# Patient Record
Sex: Male | Born: 1950
Health system: Southern US, Community
[De-identification: ages and names within clinical notes are randomized; demographics above are authoritative.]

## PROBLEM LIST (undated history)

## (undated) DIAGNOSIS — I739 Peripheral vascular disease, unspecified: Secondary | ICD-10-CM

## (undated) DIAGNOSIS — K589 Irritable bowel syndrome without diarrhea: Secondary | ICD-10-CM

## (undated) DIAGNOSIS — S060XAA Concussion with loss of consciousness status unknown, initial encounter: Secondary | ICD-10-CM

## (undated) DIAGNOSIS — J189 Pneumonia, unspecified organism: Secondary | ICD-10-CM

## (undated) DIAGNOSIS — K219 Gastro-esophageal reflux disease without esophagitis: Secondary | ICD-10-CM

## (undated) DIAGNOSIS — E785 Hyperlipidemia, unspecified: Secondary | ICD-10-CM

## (undated) DIAGNOSIS — I499 Cardiac arrhythmia, unspecified: Secondary | ICD-10-CM

## (undated) DIAGNOSIS — C61 Malignant neoplasm of prostate: Secondary | ICD-10-CM

## (undated) DIAGNOSIS — F32A Depression, unspecified: Secondary | ICD-10-CM

## (undated) DIAGNOSIS — M3130 Wegener's granulomatosis without renal involvement: Secondary | ICD-10-CM

## (undated) DIAGNOSIS — I10 Essential (primary) hypertension: Secondary | ICD-10-CM

## (undated) DIAGNOSIS — E119 Type 2 diabetes mellitus without complications: Secondary | ICD-10-CM

## (undated) DIAGNOSIS — R002 Palpitations: Secondary | ICD-10-CM

## (undated) DIAGNOSIS — I2510 Atherosclerotic heart disease of native coronary artery without angina pectoris: Secondary | ICD-10-CM

## (undated) DIAGNOSIS — M1990 Unspecified osteoarthritis, unspecified site: Secondary | ICD-10-CM

## (undated) DIAGNOSIS — S060X9A Concussion with loss of consciousness of unspecified duration, initial encounter: Secondary | ICD-10-CM

## (undated) DIAGNOSIS — F329 Major depressive disorder, single episode, unspecified: Secondary | ICD-10-CM

## (undated) DIAGNOSIS — R0600 Dyspnea, unspecified: Secondary | ICD-10-CM

## (undated) DIAGNOSIS — N529 Male erectile dysfunction, unspecified: Secondary | ICD-10-CM

## (undated) DIAGNOSIS — K5730 Diverticulosis of large intestine without perforation or abscess without bleeding: Secondary | ICD-10-CM

## (undated) DIAGNOSIS — T148XXA Other injury of unspecified body region, initial encounter: Secondary | ICD-10-CM

## (undated) HISTORY — DX: Diverticulosis of large intestine without perforation or abscess without bleeding: K57.30

## (undated) HISTORY — DX: Irritable bowel syndrome, unspecified: K58.9

## (undated) HISTORY — DX: Male erectile dysfunction, unspecified: N52.9

## (undated) HISTORY — DX: Wegener's granulomatosis without renal involvement: M31.30

## (undated) HISTORY — DX: Palpitations: R00.2

## (undated) HISTORY — DX: Malignant neoplasm of prostate: C61

## (undated) HISTORY — DX: Hyperlipidemia, unspecified: E78.5

## (undated) HISTORY — PX: CARDIAC CATHETERIZATION: SHX172

## (undated) HISTORY — DX: Gastro-esophageal reflux disease without esophagitis: K21.9

## (undated) HISTORY — PX: ROTATOR CUFF REPAIR: SHX139

## (undated) HISTORY — DX: Type 2 diabetes mellitus without complications: E11.9

---

## 1999-01-14 ENCOUNTER — Encounter: Payer: Self-pay | Admitting: Gastroenterology

## 1999-01-14 ENCOUNTER — Ambulatory Visit (HOSPITAL_COMMUNITY): Admission: RE | Admit: 1999-01-14 | Discharge: 1999-01-14 | Payer: Self-pay | Admitting: Gastroenterology

## 1999-07-18 ENCOUNTER — Encounter: Payer: Self-pay | Admitting: Gastroenterology

## 1999-07-18 ENCOUNTER — Encounter: Admission: RE | Admit: 1999-07-18 | Discharge: 1999-07-18 | Payer: Self-pay | Admitting: Gastroenterology

## 2004-04-16 ENCOUNTER — Emergency Department (HOSPITAL_COMMUNITY): Admission: EM | Admit: 2004-04-16 | Discharge: 2004-04-16 | Payer: Self-pay | Admitting: Emergency Medicine

## 2004-05-21 ENCOUNTER — Ambulatory Visit: Admission: RE | Admit: 2004-05-21 | Discharge: 2004-07-18 | Payer: Self-pay | Admitting: Radiation Oncology

## 2005-04-08 DIAGNOSIS — C61 Malignant neoplasm of prostate: Secondary | ICD-10-CM | POA: Diagnosis present

## 2005-04-08 HISTORY — DX: Malignant neoplasm of prostate: C61

## 2005-04-08 HISTORY — PX: INSERTION PROSTATE RADIATION SEED: SUR718

## 2005-05-13 ENCOUNTER — Ambulatory Visit: Admission: RE | Admit: 2005-05-13 | Discharge: 2005-06-21 | Payer: Self-pay | Admitting: Radiation Oncology

## 2005-08-29 ENCOUNTER — Ambulatory Visit: Admission: RE | Admit: 2005-08-29 | Discharge: 2006-06-09 | Payer: Self-pay | Admitting: Radiation Oncology

## 2006-01-22 ENCOUNTER — Encounter: Admission: RE | Admit: 2006-01-22 | Discharge: 2006-01-22 | Payer: Self-pay | Admitting: Urology

## 2006-01-29 ENCOUNTER — Ambulatory Visit (HOSPITAL_BASED_OUTPATIENT_CLINIC_OR_DEPARTMENT_OTHER): Admission: RE | Admit: 2006-01-29 | Discharge: 2006-01-29 | Payer: Self-pay | Admitting: Urology

## 2006-02-24 ENCOUNTER — Ambulatory Visit: Admission: RE | Admit: 2006-02-24 | Discharge: 2006-06-09 | Payer: Self-pay | Admitting: Radiation Oncology

## 2006-04-08 HISTORY — PX: OTHER SURGICAL HISTORY: SHX169

## 2008-04-08 DIAGNOSIS — T148XXA Other injury of unspecified body region, initial encounter: Secondary | ICD-10-CM

## 2008-04-08 HISTORY — DX: Other injury of unspecified body region, initial encounter: T14.8XXA

## 2008-08-05 ENCOUNTER — Emergency Department (HOSPITAL_COMMUNITY): Admission: EM | Admit: 2008-08-05 | Discharge: 2008-08-05 | Payer: Self-pay | Admitting: Family Medicine

## 2010-04-29 ENCOUNTER — Encounter: Payer: Self-pay | Admitting: Gastroenterology

## 2010-09-08 ENCOUNTER — Emergency Department (HOSPITAL_COMMUNITY)
Admission: EM | Admit: 2010-09-08 | Discharge: 2010-09-08 | Disposition: A | Discharge status: Home / Self Care | Payer: BC Managed Care – PPO | Attending: Emergency Medicine | Admitting: Emergency Medicine

## 2010-09-08 ENCOUNTER — Emergency Department (HOSPITAL_COMMUNITY): Payer: BC Managed Care – PPO

## 2010-09-08 DIAGNOSIS — M545 Low back pain, unspecified: Secondary | ICD-10-CM | POA: Insufficient documentation

## 2010-09-08 DIAGNOSIS — X58XXXA Exposure to other specified factors, initial encounter: Secondary | ICD-10-CM | POA: Insufficient documentation

## 2010-09-08 DIAGNOSIS — S32009A Unspecified fracture of unspecified lumbar vertebra, initial encounter for closed fracture: Secondary | ICD-10-CM | POA: Insufficient documentation

## 2010-09-08 DIAGNOSIS — E119 Type 2 diabetes mellitus without complications: Secondary | ICD-10-CM | POA: Insufficient documentation

## 2014-07-22 ENCOUNTER — Ambulatory Visit
Admission: RE | Admit: 2014-07-22 | Discharge: 2014-07-22 | Disposition: A | Discharge status: Home / Self Care | Payer: Self-pay | Source: Ambulatory Visit | Attending: Internal Medicine | Admitting: Internal Medicine

## 2014-07-22 ENCOUNTER — Other Ambulatory Visit: Payer: Self-pay | Admitting: Internal Medicine

## 2014-07-22 DIAGNOSIS — M3130 Wegener's granulomatosis without renal involvement: Secondary | ICD-10-CM

## 2014-08-25 ENCOUNTER — Encounter: Payer: Self-pay | Admitting: Emergency Medicine

## 2014-08-26 ENCOUNTER — Institutional Professional Consult (permissible substitution): Payer: Self-pay | Admitting: Emergency Medicine

## 2014-09-07 ENCOUNTER — Institutional Professional Consult (permissible substitution): Payer: Self-pay | Admitting: Internal Medicine

## 2015-01-13 ENCOUNTER — Other Ambulatory Visit: Payer: Self-pay | Admitting: Nephrology

## 2015-01-13 DIAGNOSIS — R319 Hematuria, unspecified: Secondary | ICD-10-CM

## 2015-01-18 ENCOUNTER — Other Ambulatory Visit: Payer: Self-pay

## 2015-01-25 ENCOUNTER — Other Ambulatory Visit: Payer: Self-pay

## 2015-01-26 ENCOUNTER — Ambulatory Visit
Admission: RE | Admit: 2015-01-26 | Discharge: 2015-01-26 | Disposition: A | Discharge status: Home / Self Care | Payer: Medicare Other | Source: Ambulatory Visit | Attending: Nephrology | Admitting: Nephrology

## 2015-01-26 DIAGNOSIS — R319 Hematuria, unspecified: Secondary | ICD-10-CM

## 2016-05-08 ENCOUNTER — Other Ambulatory Visit: Payer: Self-pay | Admitting: Gastroenterology

## 2016-05-08 DIAGNOSIS — E1165 Type 2 diabetes mellitus with hyperglycemia: Secondary | ICD-10-CM | POA: Diagnosis not present

## 2016-05-08 DIAGNOSIS — Z79899 Other long term (current) drug therapy: Secondary | ICD-10-CM | POA: Diagnosis not present

## 2016-05-08 DIAGNOSIS — E7800 Pure hypercholesterolemia, unspecified: Secondary | ICD-10-CM | POA: Diagnosis not present

## 2016-05-08 DIAGNOSIS — Z1389 Encounter for screening for other disorder: Secondary | ICD-10-CM | POA: Diagnosis not present

## 2016-05-08 DIAGNOSIS — F419 Anxiety disorder, unspecified: Secondary | ICD-10-CM | POA: Diagnosis not present

## 2016-05-08 DIAGNOSIS — M3130 Wegener's granulomatosis without renal involvement: Secondary | ICD-10-CM | POA: Diagnosis not present

## 2016-05-08 DIAGNOSIS — Z0001 Encounter for general adult medical examination with abnormal findings: Secondary | ICD-10-CM | POA: Diagnosis not present

## 2016-05-08 DIAGNOSIS — K219 Gastro-esophageal reflux disease without esophagitis: Secondary | ICD-10-CM | POA: Diagnosis not present

## 2016-05-08 DIAGNOSIS — C61 Malignant neoplasm of prostate: Secondary | ICD-10-CM | POA: Diagnosis not present

## 2016-05-08 DIAGNOSIS — R0600 Dyspnea, unspecified: Secondary | ICD-10-CM | POA: Diagnosis not present

## 2016-06-06 DIAGNOSIS — M545 Low back pain: Secondary | ICD-10-CM | POA: Diagnosis not present

## 2016-06-06 DIAGNOSIS — R319 Hematuria, unspecified: Secondary | ICD-10-CM | POA: Diagnosis not present

## 2016-06-06 DIAGNOSIS — R5382 Chronic fatigue, unspecified: Secondary | ICD-10-CM | POA: Diagnosis not present

## 2016-06-06 DIAGNOSIS — R0602 Shortness of breath: Secondary | ICD-10-CM | POA: Diagnosis not present

## 2016-06-06 DIAGNOSIS — M150 Primary generalized (osteo)arthritis: Secondary | ICD-10-CM | POA: Diagnosis not present

## 2016-06-06 DIAGNOSIS — M3130 Wegener's granulomatosis without renal involvement: Secondary | ICD-10-CM | POA: Diagnosis not present

## 2016-06-07 ENCOUNTER — Other Ambulatory Visit: Payer: Self-pay | Admitting: Physician Assistant

## 2016-06-07 DIAGNOSIS — R0602 Shortness of breath: Secondary | ICD-10-CM

## 2016-06-11 ENCOUNTER — Ambulatory Visit
Admission: RE | Admit: 2016-06-11 | Discharge: 2016-06-11 | Disposition: A | Discharge status: Home / Self Care | Payer: Medicare Other | Source: Ambulatory Visit | Attending: Physician Assistant | Admitting: Physician Assistant

## 2016-06-11 ENCOUNTER — Inpatient Hospital Stay
Admission: RE | Admit: 2016-06-11 | Discharge: 2016-06-11 | Disposition: A | Discharge status: Home / Self Care | Payer: Self-pay | Source: Ambulatory Visit | Attending: Physician Assistant | Admitting: Physician Assistant

## 2016-06-11 DIAGNOSIS — R0602 Shortness of breath: Secondary | ICD-10-CM

## 2016-06-11 DIAGNOSIS — R918 Other nonspecific abnormal finding of lung field: Secondary | ICD-10-CM | POA: Diagnosis not present

## 2016-06-11 MED ORDER — IOPAMIDOL (ISOVUE-300) INJECTION 61%
100.0000 mL | Freq: Once | INTRAVENOUS | Status: AC | PRN
  Administered 2016-06-11: 100 mL via INTRAVENOUS

## 2016-06-19 ENCOUNTER — Encounter (HOSPITAL_COMMUNITY): Payer: Self-pay | Admitting: *Deleted

## 2016-06-20 ENCOUNTER — Encounter (HOSPITAL_COMMUNITY): Payer: Self-pay | Admitting: *Deleted

## 2016-06-20 NOTE — Progress Notes (Signed)
Patient told to be clear liquids up to 3 hours before procedure on 06-24-16, spoke with dr hodierne anesthesia and patient is to be clear liquids until 6 hours before procedure and called patient and made aware clear liquids until 6 hours before procedure.

## 2016-06-24 ENCOUNTER — Ambulatory Visit (HOSPITAL_COMMUNITY): Payer: Medicare Other | Admitting: Anesthesiology

## 2016-06-24 ENCOUNTER — Encounter (HOSPITAL_COMMUNITY)
Admission: RE | Disposition: A | Discharge status: Home / Self Care | Payer: Self-pay | Source: Ambulatory Visit | Attending: Gastroenterology

## 2016-06-24 ENCOUNTER — Encounter (HOSPITAL_COMMUNITY): Payer: Self-pay | Admitting: *Deleted

## 2016-06-24 ENCOUNTER — Ambulatory Visit (HOSPITAL_COMMUNITY)
Admission: RE | Admit: 2016-06-24 | Discharge: 2016-06-24 | Disposition: A | Discharge status: Home / Self Care | Payer: Medicare Other | Source: Ambulatory Visit | Attending: Gastroenterology | Admitting: Gastroenterology

## 2016-06-24 DIAGNOSIS — Z1211 Encounter for screening for malignant neoplasm of colon: Secondary | ICD-10-CM | POA: Insufficient documentation

## 2016-06-24 DIAGNOSIS — K5730 Diverticulosis of large intestine without perforation or abscess without bleeding: Secondary | ICD-10-CM | POA: Diagnosis not present

## 2016-06-24 DIAGNOSIS — E7800 Pure hypercholesterolemia, unspecified: Secondary | ICD-10-CM | POA: Insufficient documentation

## 2016-06-24 DIAGNOSIS — E119 Type 2 diabetes mellitus without complications: Secondary | ICD-10-CM | POA: Insufficient documentation

## 2016-06-24 DIAGNOSIS — K219 Gastro-esophageal reflux disease without esophagitis: Secondary | ICD-10-CM | POA: Insufficient documentation

## 2016-06-24 DIAGNOSIS — D125 Benign neoplasm of sigmoid colon: Secondary | ICD-10-CM | POA: Diagnosis not present

## 2016-06-24 DIAGNOSIS — E669 Obesity, unspecified: Secondary | ICD-10-CM | POA: Diagnosis not present

## 2016-06-24 DIAGNOSIS — D122 Benign neoplasm of ascending colon: Secondary | ICD-10-CM | POA: Insufficient documentation

## 2016-06-24 DIAGNOSIS — Z7984 Long term (current) use of oral hypoglycemic drugs: Secondary | ICD-10-CM | POA: Diagnosis not present

## 2016-06-24 DIAGNOSIS — Z8546 Personal history of malignant neoplasm of prostate: Secondary | ICD-10-CM | POA: Insufficient documentation

## 2016-06-24 HISTORY — PX: COLONOSCOPY: SHX5424

## 2016-06-24 HISTORY — DX: Concussion with loss of consciousness of unspecified duration, initial encounter: S06.0X9A

## 2016-06-24 HISTORY — DX: Concussion with loss of consciousness status unknown, initial encounter: S06.0XAA

## 2016-06-24 HISTORY — DX: Other injury of unspecified body region, initial encounter: T14.8XXA

## 2016-06-24 LAB — GLUCOSE, CAPILLARY: Glucose-Capillary: 93 mg/dL (ref 65–99)

## 2016-06-24 SURGERY — COLONOSCOPY
Anesthesia: Monitor Anesthesia Care

## 2016-06-24 MED ORDER — SODIUM CHLORIDE 0.9 % IV SOLN: INTRAVENOUS | Status: DC

## 2016-06-24 MED ORDER — LACTATED RINGERS IV SOLN
INTRAVENOUS | Status: DC
  Administered 2016-06-24: 1000 mL via INTRAVENOUS
  Administered 2016-06-24: 11:00:00 via INTRAVENOUS

## 2016-06-24 MED ORDER — GLYCOPYRROLATE 0.2 MG/ML IJ SOLN
INTRAMUSCULAR | Status: DC | PRN
  Administered 2016-06-24: 0.2 mg via INTRAVENOUS

## 2016-06-24 MED ORDER — PROPOFOL 500 MG/50ML IV EMUL
INTRAVENOUS | Status: DC | PRN
Start: 2016-06-24 — End: 2016-06-24
  Administered 2016-06-24: 125 ug/kg/min via INTRAVENOUS

## 2016-06-24 MED ORDER — PHENYLEPHRINE 40 MCG/ML (10ML) SYRINGE FOR IV PUSH (FOR BLOOD PRESSURE SUPPORT)
PREFILLED_SYRINGE | INTRAVENOUS | Status: AC
  Filled 2016-06-24: qty 10

## 2016-06-24 MED ORDER — PROPOFOL 500 MG/50ML IV EMUL
INTRAVENOUS | Status: DC | PRN
  Administered 2016-06-24: 50 mg via INTRAVENOUS

## 2016-06-24 MED ORDER — PROPOFOL 10 MG/ML IV BOLUS
INTRAVENOUS | Status: AC
  Filled 2016-06-24: qty 40

## 2016-06-24 MED ORDER — GLYCOPYRROLATE 0.2 MG/ML IV SOSY
PREFILLED_SYRINGE | INTRAVENOUS | Status: AC
  Filled 2016-06-24: qty 5

## 2016-06-24 NOTE — Anesthesia Preprocedure Evaluation (Addendum)
Anesthesia Evaluation  Patient identified by MRN, date of birth, ID band Patient awake    Reviewed: Allergy & Precautions, NPO status , Patient's Chart, lab work & pertinent test results  History of Anesthesia Complications Negative for: history of anesthetic complications  Airway Mallampati: II  TM Distance: >3 FB Neck ROM: Full    Dental  (+) Chipped, Poor Dentition, Dental Advisory Given   Pulmonary Recent URI , former smoker (quit 1993),  Wegener's granulomatosis   breath sounds clear to auscultation       Cardiovascular negative cardio ROS   Rhythm:Regular Rate:Normal     Neuro/Psych negative neurological ROS     GI/Hepatic Neg liver ROS, GERD  Controlled,diverticulosis   Endo/Other  diabetes, Oral Hypoglycemic Agents  Renal/GU negative Renal ROS   Prostate cancer    Musculoskeletal   Abdominal   Peds  Hematology negative hematology ROS (+)   Anesthesia Other Findings   Reproductive/Obstetrics                            Anesthesia Physical Anesthesia Plan  ASA: II  Anesthesia Plan: MAC   Post-op Pain Management:    Induction: Intravenous  Airway Management Planned: Natural Airway  Additional Equipment:   Intra-op Plan:   Post-operative Plan:   Informed Consent: I have reviewed the patients History and Physical, chart, labs and discussed the procedure including the risks, benefits and alternatives for the proposed anesthesia with the patient or authorized representative who has indicated his/her understanding and acceptance.   Dental advisory given  Plan Discussed with: CRNA and Surgeon  Anesthesia Plan Comments: (Plan routine monitors, MAC)        Anesthesia Quick Evaluation

## 2016-06-24 NOTE — H&P (Signed)
Procedure: Screening colonoscopy. Normal screening colonoscopy was performed on 04/04/2006  History: The patient is a 66 year old male born 1950-10-10. He is scheduled to undergo a repeat screening colonoscopy today.  Past medical history: Wegener's granulomatosis. Hypercholesterolemia. Prostate cancer. Benign prostatic hypertrophy. Type 2 diabetes mellitus. Prostate cancer seed implant. Rotator cuff repair surgery.  Exam: The patient is alert and lying comfortably on the endoscopy stretcher. Abdomen is soft and nontender to palpation. Lungs are clear to auscultation. Cardiac exam reveals a regular rhythm.  Plan: Proceed with screening colonoscopy

## 2016-06-24 NOTE — Anesthesia Postprocedure Evaluation (Signed)
Anesthesia Post Note  Patient: Brian Hudson  Procedure(s) Performed: Procedure(s) (LRB): COLONOSCOPY with propofol (N/A)  Patient location during evaluation: Endoscopy Anesthesia Type: MAC Level of consciousness: oriented, awake and alert and patient cooperative Pain management: pain level controlled Vital Signs Assessment: post-procedure vital signs reviewed and stable Respiratory status: spontaneous breathing and nonlabored ventilation Cardiovascular status: stable and blood pressure returned to baseline Postop Assessment: no signs of nausea or vomiting Anesthetic complications: no       Last Vitals:  Vitals:   06/24/16 1026 06/24/16 1148  BP: 125/84 125/60  Pulse:  66  Resp: 14 17  Temp: 36.4 C 36.7 C    Last Pain:  Vitals:   06/24/16 1148  TempSrc: Oral                 Jurnee Nakayama,E. Louise Victory

## 2016-06-24 NOTE — Op Note (Signed)
[facility] Patient Name: Brian Hudson Procedure Date: 06/24/2016 MRN: [id number] Attending MD: Garlan Fair , MD Date of Birth: 28-Oct-1950 CSN: [id number] Age: 67 Admit Type: Outpatient Procedure:                Colonoscopy Indications:              Screening for colorectal malignant neoplasm Providers:                Garlan Fair, MD, Hilma Favors, RN, William Dalton, Technician Referring MD:              Medicines:                Propofol per Anesthesia Complications:            No immediate complications. Estimated Blood Loss:     Estimated blood loss was minimal. Procedure:                Pre-Anesthesia Assessment:                           - Prior to the procedure, a History and Physical                            was performed, and patient medications and                            allergies were reviewed. The patient's tolerance of                            previous anesthesia was also reviewed. The risks                            and benefits of the procedure and the sedation                            options and risks were discussed with the patient.                            All questions were answered, and informed consent                            was obtained. Prior Anticoagulants: The patient has                            taken aspirin, last dose was day of procedure. ASA                            Grade Assessment: II - A patient with mild systemic                            disease. After reviewing the risks and benefits,  the patient was deemed in satisfactory condition to                            undergo the procedure.                           After obtaining informed consent, the colonoscope                            was passed under direct vision. Throughout the                            procedure, the patient's blood pressure, pulse, and                            oxygen  saturations were monitored continuously. The                            EC-[id number] ([id number]) scope was introduced through                            the anus and advanced to the the cecum, identified                            by appendiceal orifice and ileocecal valve. The                            colonoscopy was performed without difficulty. The                            patient tolerated the procedure well. The quality                            of the bowel preparation was good. The terminal                            ileum, the ileocecal valve, the appendiceal orifice                            and the rectum were photographed. Scope In: 11:22:40 AM Scope Out: 11:39:16 AM Scope Withdrawal Time: 0 hours 11 minutes 31 seconds  Total Procedure Duration: 0 hours 16 minutes 36 seconds  Findings:      The perianal and digital rectal examinations were normal.      Two sessile polyps were found in the distal ascending colon. The polyps       were 4 to 6 mm in size. These polyps were removed with a cold snare.       Resection and retrieval were complete.      A 7 mm polyp was found in the proximal sigmoid colon. The polyp was       sessile. The polyp was removed with a cold snare. Resection and       retrieval were complete.      The exam was otherwise without abnormality. Impression:               -  Two 4 to 6 mm polyps in the distal ascending                            colon, removed with a cold snare. Resected and                            retrieved.                           - One 7 mm polyp in the proximal sigmoid colon,                            removed with a cold snare. Resected and retrieved.                           - The examination was otherwise normal. Moderate Sedation:      N/A- Per Anesthesia Care Recommendation:           - Patient has a contact number available for                            emergencies. The signs and symptoms of potential                             delayed complications were discussed with the                            patient. Return to normal activities tomorrow.                            Written discharge instructions were provided to the                            patient.                           - Repeat colonoscopy date to be determined after                            pending pathology results are reviewed for                            surveillance.                           - Resume previous diet.                           - Continue present medications. Procedure Code(s):        --- Professional ---                           [phone], Colonoscopy, flexible; with removal of                            tumor(s), polyp(s), or other lesion(s)  by snare                            technique Diagnosis Code(s):        --- Professional ---                           Z12.11, Encounter for screening for malignant                            neoplasm of colon                           D12.2, Benign neoplasm of ascending colon                           D12.5, Benign neoplasm of sigmoid colon CPT copyright 2016 [employer]. All rights reserved. The codes documented in this report are preliminary and upon coder review may  be revised to meet current compliance requirements. Earle Gell, MD Garlan Fair, MD 06/24/2016 11:45:59 AM This report has been signed electronically. Number of Addenda: 0

## 2016-06-24 NOTE — Discharge Instructions (Signed)
YOU HAD AN ENDOSCOPIC PROCEDURE TODAY: Refer to the procedure report and other information in the discharge instructions given to you for any specific questions about what was found during the examination. If this information does not answer your questions, please call Dr. Wynetta Emery office at [phone] to clarify.   YOU SHOULD EXPECT: Some feelings of bloating in the abdomen. Passage of more gas than usual. Walking can help get rid of the air that was put into your GI tract during the procedure and reduce the bloating. If you had a lower endoscopy (such as a colonoscopy or flexible sigmoidoscopy) you may notice spotting of blood in your stool or on the toilet paper. Some abdominal soreness may be present for a day or two, also.  DIET: Your first meal following the procedure should be a light meal and then it is ok to progress to your normal diet. A half-sandwich or bowl of soup is an example of a good first meal. Heavy or fried foods are harder to digest and may make you feel nauseous or bloated. Drink plenty of fluids but you should avoid alcoholic beverages for 24 hours. If you had a esophageal dilation, please see attached instructions for diet.   ACTIVITY: Your care partner should take you home directly after the procedure. You should plan to take it easy, moving slowly for the rest of the day. You can resume normal activity the day after the procedure however YOU SHOULD NOT DRIVE, use power tools, machinery or perform tasks that involve climbing or major physical exertion for 24 hours (because of the sedation medicines used during the test).   SYMPTOMS TO REPORT IMMEDIATELY: A gastroenterologist can be reached at any hour. Please call [phone] for any of the following symptoms:  Following lower endoscopy (colonoscopy, flexible sigmoidoscopy) Excessive amounts of blood in the stool  Significant tenderness, worsening of abdominal pains  Swelling of the abdomen that is new, acute  Fever of 100  or higher  Following upper endoscopy (EGD, EUS, ERCP, esophageal dilation) Vomiting of blood or coffee ground material  New, significant abdominal pain  New, significant chest pain or pain under the shoulder blades  Painful or persistently difficult swallowing  New shortness of breath  Black, tarry-looking or red, bloody stools  FOLLOW UP:  If any biopsies were taken you will be contacted by phone or by letter within the next 1-3 weeks. Call [phone]  if you have not heard about the biopsies in 3 weeks.  Please also call with any specific questions about appointments or follow up tests.  YOU HAD AN ENDOSCOPIC PROCEDURE TODAY: Refer to the procedure report and other information in the discharge instructions given to you for any specific questions about what was found during the examination. If this information does not answer your questions, please call Dr. Wynetta Emery office at [phone] to clarify.   YOU SHOULD EXPECT: Some feelings of bloating in the abdomen. Passage of more gas than usual. Walking can help get rid of the air that was put into your GI tract during the procedure and reduce the bloating. If you had a lower endoscopy (such as a colonoscopy or flexible sigmoidoscopy) you may notice spotting of blood in your stool or on the toilet paper. Some abdominal soreness may be present for a day or two, also.  DIET: Your first meal following the procedure should be a light meal and then it is ok to progress to your normal diet. A half-sandwich or bowl of soup is an example  of a good first meal. Heavy or fried foods are harder to digest and may make you feel nauseous or bloated. Drink plenty of fluids but you should avoid alcoholic beverages for 24 hours. If you had a esophageal dilation, please see attached instructions for diet.   ACTIVITY: Your care partner should take you home directly after the procedure. You should plan to take it easy, moving slowly for the rest of the day. You can  resume normal activity the day after the procedure however YOU SHOULD NOT DRIVE, use power tools, machinery or perform tasks that involve climbing or major physical exertion for 24 hours (because of the sedation medicines used during the test).   SYMPTOMS TO REPORT IMMEDIATELY: A gastroenterologist can be reached at any hour. Please call [phone] for any of the following symptoms:  Following lower endoscopy (colonoscopy, flexible sigmoidoscopy) Excessive amounts of blood in the stool  Significant tenderness, worsening of abdominal pains  Swelling of the abdomen that is new, acute  Fever of 100 or higher  Following upper endoscopy (EGD, EUS, ERCP, esophageal dilation) Vomiting of blood or coffee ground material  New, significant abdominal pain  New, significant chest pain or pain under the shoulder blades  Painful or persistently difficult swallowing  New shortness of breath  Black, tarry-looking or red, bloody stools  FOLLOW UP:  If any biopsies were taken you will be contacted by phone or by letter within the next 1-3 weeks. Call [phone]  if you have not heard about the biopsies in 3 weeks.  Please also call with any specific questions about appointments or follow up tests.

## 2016-06-24 NOTE — Transfer of Care (Signed)
Immediate Anesthesia Transfer of Care Note  Patient: Brian Hudson  Procedure(s) Performed: Procedure(s): COLONOSCOPY with propofol (N/A)  Patient Location: PACU  Anesthesia Type:MAC  Level of Consciousness:  sedated, patient cooperative and responds to stimulation  Airway & Oxygen Therapy:Patient Spontanous Breathing and Patient connected to face mask oxgen  Post-op Assessment:  Report given to PACU RN and Post -op Vital signs reviewed and stable  Post vital signs:  Reviewed and stable  Last Vitals:  Vitals:   06/24/16 1026  BP: 125/84  Resp: 14  Temp: 86.5 C    Complications: No apparent anesthesia complications

## 2016-06-26 ENCOUNTER — Encounter (HOSPITAL_COMMUNITY): Payer: Self-pay | Admitting: Gastroenterology

## 2016-07-08 DIAGNOSIS — R0602 Shortness of breath: Secondary | ICD-10-CM | POA: Diagnosis not present

## 2016-07-08 DIAGNOSIS — M150 Primary generalized (osteo)arthritis: Secondary | ICD-10-CM | POA: Diagnosis not present

## 2016-07-08 DIAGNOSIS — M3130 Wegener's granulomatosis without renal involvement: Secondary | ICD-10-CM | POA: Diagnosis not present

## 2016-07-08 DIAGNOSIS — M545 Low back pain: Secondary | ICD-10-CM | POA: Diagnosis not present

## 2016-07-30 ENCOUNTER — Telehealth: Payer: Self-pay | Admitting: Internal Medicine

## 2016-07-30 NOTE — Telephone Encounter (Signed)
Pt can be seen on 08/21/16 at noon.

## 2016-07-30 NOTE — Telephone Encounter (Signed)
Hi Triage/Elise  I got a referral letter specifically for me due to patient hving wegner vasculitis. Referral was from [place] at [facility]. Please change to see me. Please work with Daneil Dan to get patient in on or before mid-may 2018  Thanks  Dr. Brand Males, M.D., [facility].C.P Pulmonary and Critical Care Medicine Staff Physician [facility] Pager: [phone], If no answer or between  15:00h - 7:00h: call [phone]  07/30/2016 5:19 AM

## 2016-07-30 NOTE — Telephone Encounter (Signed)
Brian Hudson please advise when pt can be worked in.  Thanks.

## 2016-08-01 NOTE — Telephone Encounter (Signed)
Called and spoke to pt. Informed him of the change in appts. Pt verbalized understanding and denied any further questions or concerns at this time.

## 2016-08-20 ENCOUNTER — Institutional Professional Consult (permissible substitution): Payer: Self-pay | Admitting: Internal Medicine

## 2016-08-21 ENCOUNTER — Ambulatory Visit (INDEPENDENT_AMBULATORY_CARE_PROVIDER_SITE_OTHER): Payer: Medicare Other | Admitting: Internal Medicine

## 2016-08-21 ENCOUNTER — Encounter: Payer: Self-pay | Admitting: Internal Medicine

## 2016-08-21 VITALS — BP 118/70 | HR 47 | Ht 69.0 in | Wt 171.8 lb

## 2016-08-21 DIAGNOSIS — R0689 Other abnormalities of breathing: Secondary | ICD-10-CM

## 2016-08-21 DIAGNOSIS — R911 Solitary pulmonary nodule: Secondary | ICD-10-CM | POA: Diagnosis not present

## 2016-08-21 DIAGNOSIS — R0600 Dyspnea, unspecified: Secondary | ICD-10-CM | POA: Diagnosis not present

## 2016-08-21 DIAGNOSIS — I2510 Atherosclerotic heart disease of native coronary artery without angina pectoris: Secondary | ICD-10-CM

## 2016-08-21 DIAGNOSIS — Z8679 Personal history of other diseases of the circulatory system: Secondary | ICD-10-CM | POA: Insufficient documentation

## 2016-08-21 DIAGNOSIS — Z87891 Personal history of nicotine dependence: Secondary | ICD-10-CM | POA: Insufficient documentation

## 2016-08-21 LAB — NITRIC OXIDE: NITRIC OXIDE: 34

## 2016-08-21 MED ORDER — BUDESONIDE-FORMOTEROL FUMARATE 160-4.5 MCG/ACT IN AERO: 2.0000 | INHALATION_SPRAY | Freq: Two times a day (BID) | RESPIRATORY_TRACT | 0 refills | Status: DC

## 2016-08-21 NOTE — Patient Instructions (Addendum)
ICD-9-CM ICD-10-CM   1. Dyspnea and respiratory abnormalities 786.09 R06.00     R06.89   2. Stopped smoking with greater than 40 pack year history V15.82 Z87.891   3. Coronary artery calcification seen on CAT scan 414.00 I25.10   4. Nodule of left lung 793.11 R91.1   5. History of Wegener's granulomatosis V12.59 Z86.79     WE have to think beyond WEgner in terms of your shortness of breath YOu might have asthma or copd based on FeNO test 08/21/2016 and history of smoking Need to rule out blockage in heart as reason for symptoms  PLAN  - start symbicort 2 puff twice daily - take sample worth 1 month  - do ono test room air next few to several days  - refer cardiology  - [name] or [facility] first available - sign record releast to get more info on wegner from [name] esp blood work  - do full PFT  Followup  next few weeks but after finishing all of above; can return to see APP Tammy or Judson Roch If tests unrevealing next step is CPST bike test   Regarding CT chest - will do followup 6-9 months from now ; will order at followup

## 2016-08-21 NOTE — Progress Notes (Signed)
Subjective:     Patient ID: Brian Hudson, male   DOB: 17-Sep-1950, 66 y.o.   MRN: [id number]  HPI  PCP Josetta Huddle, MD   IOV 08/21/2016  Chief Complaint  Patient presents with  . Pulm Consult    Referred by Leafy Kindle PA-C for vasculitis.    66 year old male referred by Dr. Gavin Pound and of physician assistant for evaluation of shortness of breath in the setting of previous diagnosis of Wegner's. There is very little outside information but in terms of his Wegener's granulomatosis he tells me that he was diagnosed with this in 2004 following significant sinus symptoms including bloody sinus drainage. This was at [facility]. He was treated for approximately one year with Cytoxan and subsequently on maintenance methotrexate. His last treatment for Wegener's was in 2008. Since then he's been on observation therapy. Treatment Back then  included prednisone as well. Then approximately 2 or 3 years ago he switched care to Dr. Gavin Pound and rheumatology. According to him his been on observation therapy there as well. He tells me now for the last year or 2 he's been more fatigue associated with shortness of breath. He used to walk 2 miles a day but now is unable to do that. Dyspnea is present all the time but definitely more so with exertion and relieved by rest. There is associated cough and sometimes hemoptysis especially early in the morning but this is been ongoing for several years. He thinks his Wegener's might be active. He believes Dr. Gavin Pound is done some blood work to look for vasculitis activity but I do not have those results with me. A CT scan of the chest was done that showed left upper lobe groundglass opacity and therefore he is been here. He denies any chest pain     FeNO 34ppb 08/21/2016 and slightly high  Walking desaturation test on 08/21/2016 185 feet x 3 laps on RA:  did NOT desaturate. Rest pulse ox was 100%, final pulse ox was 90. HR response 50/min  at rest to 62/min at peak exertion.    IMPRESSION: CT scan of the chest personally visualized 1. No acute abnormality is seen on CT of the chest. 2. Coronary artery calcifications. 3. Minimal ground-glass opacity in the left upper lobe of doubtful significance. Initial follow-up with CT at 6-12 months is recommended to confirm persistence. If persistent, repeat CT is recommended every 2 years until 5 years of stability has been established. This recommendation follows the consensus statement: Guidelines for Management of Incidental Pulmonary Nodules Detected on CT Images: From the Fleischner Society 2017; Radiology 2017; 284:228-243.   Electronically Signed   By: Ivar Drape M.D.   On: 06/11/2016 17:14      has a past medical history of Anxiety; Concussion; DM (diabetes mellitus) ([place]); ED (erectile dysfunction); Fracture (2010); GERD (gastroesophageal reflux disease); Hyperlipidemia; IBS (irritable bowel syndrome); Palpitations; Prostate cancer ([place]) (2007); Sigmoid diverticulosis; and Wegener's granulomatosis ([place]).   reports that he quit smoking about 25 years ago. His smoking use included Cigarettes. He has a 105.00 pack-year smoking history. He has never used smokeless tobacco.  Past Surgical History:  Procedure Laterality Date  . COLONOSCOPY N/A 06/24/2016   Procedure: COLONOSCOPY with propofol;  Surgeon: Garlan Fair, MD;  Location: [facility];  Service: Endoscopy;  Laterality: N/A;  . colonscopy  2008  . INSERTION PROSTATE RADIATION SEED  2007  . ROTATOR CUFF REPAIR Right     Allergies  Allergen Reactions  .  Codeine     "jittery"  . Lipitor [Atorvastatin]     arthralgia  . Zetia [Ezetimibe]     arthralgia  . Zocor [Simvastatin]     arthralgia    Immunization History  Administered Date(s) Administered  . Pneumococcal Polysaccharide-23 02/19/2012    Family History  Problem Relation Age of Onset  . Heart attack Father 71  . CAD Father       Current Outpatient Prescriptions:  .  acetaminophen (TYLENOL) 500 MG tablet, Take 1,000 mg by mouth daily., Disp: , Rfl:  .  escitalopram (LEXAPRO) 10 MG tablet, Take 10 mg by mouth daily., Disp: , Rfl:  .  glipiZIDE (GLUCOTROL XL) 5 MG 24 hr tablet, Take 5 mg by mouth 2 (two) times daily., Disp: , Rfl:  .  hydrocortisone cream 1 %, Apply 1 application topically daily as needed for itching., Disp: , Rfl:  .  metFORMIN (GLUCOPHAGE-XR) 500 MG 24 hr tablet, Take 500 mg by mouth daily with supper., Disp: , Rfl:  .  metoprolol tartrate (LOPRESSOR) 25 MG tablet, Take 25 mg by mouth 2 (two) times daily., Disp: , Rfl:  .  Multiple Vitamin (MULTIVITAMIN WITH MINERALS) TABS tablet, Take 1 tablet by mouth daily., Disp: , Rfl:  .  omeprazole (PRILOSEC) 20 MG capsule, Take 20 mg by mouth daily., Disp: , Rfl:  .  sodium chloride (OCEAN) 0.65 % SOLN nasal spray, Place 1 spray into both nostrils as needed for congestion., Disp: , Rfl:    Review of Systems     Objective:   Physical Exam  Constitutional: He is oriented to person, place, and time. He appears well-developed and well-nourished. No distress.  HENT:  Head: Normocephalic and atraumatic.  Right Ear: External ear normal.  Left Ear: External ear normal.  Mouth/Throat: Oropharynx is clear and moist. No oropharyngeal exudate.  Nasal bridge downward  Eyes: Conjunctivae and EOM are normal. Pupils are equal, round, and reactive to light. Right eye exhibits no discharge. Left eye exhibits no discharge. No scleral icterus.  Neck: Normal range of motion. Neck supple. No JVD present. No tracheal deviation present. No thyromegaly present.  Cardiovascular: Normal rate, regular rhythm and intact distal pulses.  Exam reveals no gallop and no friction rub.   No murmur heard. Pulmonary/Chest: Effort normal and breath sounds normal. No respiratory distress. He has no wheezes. He has no rales. He exhibits no tenderness.  mildlly labord talking  Abdominal:  Soft. Bowel sounds are normal. He exhibits no distension and no mass. There is no tenderness. There is no rebound and no guarding.  Musculoskeletal: Normal range of motion. He exhibits no edema or tenderness.  Lymphadenopathy:    He has no cervical adenopathy.  Neurological: He is alert and oriented to person, place, and time. He has normal reflexes. No cranial nerve deficit. Coordination normal.  Skin: Skin is warm and dry. No rash noted. He is not diaphoretic. No erythema. No pallor.  Psychiatric: He has a normal mood and affect. His behavior is normal. Judgment and thought content normal.  Nursing note and vitals reviewed.  Vitals:   08/21/16 1213  Weight: 171 lb 12.8 oz (77.9 kg)  Height: 5\' 9"  (1.753 m)    Estimated body mass index is 25.37 kg/m as calculated from the following:   Height as of this encounter: 5\' 9"  (1.753 m).   Weight as of this encounter: 171 lb 12.8 oz (77.9 kg).  Vitals:   08/21/16 1213  Weight: 171 lb 12.8 oz (77.9  kg)  Height: 5\' 9"  (1.753 m)       Assessment:       ICD-9-CM ICD-10-CM   1. Dyspnea and respiratory abnormalities 786.09 R06.00     R06.89   2. Stopped smoking with greater than 40 pack year history V15.82 Z87.891   3. Coronary artery calcification seen on CAT scan 414.00 I25.10   4. Nodule of left lung 793.11 R91.1   5. History of Wegener's granulomatosis V12.59 Z86.79        Plan:      WE have to think beyond WEgner in terms of your shortness of breath YOu might have asthma or copd based on FeNO test 08/21/2016 and history of smoking Need to rule out blockage in heart as reason for symptoms  PLAN  - start symbicort 2 puff twice daily - take sample worth 1 month  - do ono test room air next few to several days  - refer cardiology  - [name] or [facility] first available - sign record releast to get more info on wegner from [name] esp blood work  - do full PFT  Followup  next few weeks but after finishing all of above; can  return to see APP Tammy or Judson Roch If tests unrevealing next step is CPST bike test    Regarding CT chest - will do followup 6-9 months from now ; will order at followup    Dr. Brand Males, M.D., [facility].C.P Pulmonary and Critical Care Medicine Staff Physician [place] [facility] Pager: [phone], If no answer or between  15:00h - 7:00h: call [phone]  08/21/2016 1:10 PM

## 2016-08-21 NOTE — Addendum Note (Signed)
Addended by: Valerie Salts on: 08/21/2016 02:13 PM   Modules accepted: Orders

## 2016-08-21 NOTE — Progress Notes (Signed)
   Subjective:    Patient ID: Brian Hudson, male    DOB: 1950-05-21, 66 y.o.   MRN: [id number]  HPI    Review of Systems  Constitutional: Negative for fever and unexpected weight change.  HENT: Positive for congestion and dental problem. Negative for ear pain, nosebleeds, postnasal drip, rhinorrhea, sinus pressure, sneezing, sore throat and trouble swallowing.   Eyes: Negative for redness and itching.  Respiratory: Positive for chest tightness and shortness of breath. Negative for cough and wheezing.   Cardiovascular: Negative for palpitations and leg swelling.  Gastrointestinal: Negative for nausea and vomiting.  Genitourinary: Negative for dysuria.  Musculoskeletal: Positive for joint swelling.  Skin: Negative for rash.  Neurological: Negative for headaches.  Hematological: Does not bruise/bleed easily.  Psychiatric/Behavioral: Negative for dysphoric mood. The patient is nervous/anxious.        Objective:   Physical Exam        Assessment & Plan:

## 2016-09-03 DIAGNOSIS — R0600 Dyspnea, unspecified: Secondary | ICD-10-CM | POA: Diagnosis not present

## 2016-09-04 ENCOUNTER — Encounter: Payer: Self-pay | Admitting: Internal Medicine

## 2016-09-05 NOTE — Progress Notes (Signed)
Cardiology Office Note NEW PATIENT VISIT  Date:  09/06/2016   ID:  Brian Hudson, DOB Jan 29, 1951, MRN [id number]  PCP:  Brian Huddle, MD  Cardiologist:  NEW Dr. Radford Pax    Chief Complaint  Patient presents with  . Shortness of Breath    new pt      History of Present Illness: Brian Hudson is a 66 y.o. male who presents for coronary calcifications on CT of chest.  He was seen by Dr. Geoffery Spruce for SOB with hx of Wegner's, pt also with COPD.  But with coronary calcifications and SOB concern for CAD.   Also with diabetes.    His SOB is all the time but especially with exertion- he was walking 2 miles per day but he needed to have increased stopping for racing HR and DOE.  He no longer walks for exercise due to this. No chest pain.  Now with talking he is SOB, the longer he talks the more SOB.  He is on BB felt to be due to palpitations, no old records.  But today HR is 44.  On visit with Pulmonary HR 47.  SB.  No lightheadedness no dizziness and no syncope.  He has been intolerant to statins.      His past medical history includes Anxiety; Concussion; DM type 2 for 15 to 20 yrs. (diabetes mellitus) ([place]); ED (erectile dysfunction); Fracture (2010); GERD (gastroesophageal reflux disease); Hyperlipidemia; IBS (irritable bowel syndrome); Palpitations; Prostate cancer ([place]) (2007); Sigmoid diverticulosis; and Wegener's granulomatosis ([place]).  Remote stress test 6-7 years ago.    In Dr. Golden Pop note  "Walking desaturation test on 08/21/2016 185 feet x 3 laps on RA:  did NOT desaturate. Rest pulse ox was 100%, final pulse ox was 90. HR response 50/min at rest to 62/min at peak exertion."  Possible chronotopic incompetence.   He complains of Rt back pain today "grabbing Pain"  Brief episodes.    Past Medical History:  Diagnosis Date  . Anxiety   . Concussion   . DM (diabetes mellitus) ([place])    type 2  . ED (erectile dysfunction)   . Fracture 2010   L 3 and L 4 healed with brace  .  GERD (gastroesophageal reflux disease)   . Hyperlipidemia   . IBS (irritable bowel syndrome)   . Palpitations    none in last few yrs  . Prostate cancer ([place]) 2007  . Sigmoid diverticulosis   . Wegener's granulomatosis ([place])    followed by dr Berna Bue    Past Surgical History:  Procedure Laterality Date  . COLONOSCOPY N/A 06/24/2016   Procedure: COLONOSCOPY with propofol;  Surgeon: Garlan Fair, MD;  Location: [facility];  Service: Endoscopy;  Laterality: N/A;  . colonscopy  2008  . INSERTION PROSTATE RADIATION SEED  2007  . ROTATOR CUFF REPAIR Right      Current Outpatient Prescriptions  Medication Sig Dispense Refill  . acetaminophen (TYLENOL) 325 MG tablet Take 650 mg by mouth as needed for mild pain or moderate pain.    . budesonide-formoterol (SYMBICORT) 160-4.5 MCG/ACT inhaler Inhale 2 puffs into the lungs 2 (two) times daily. 2 Inhaler 0  . escitalopram (LEXAPRO) 10 MG tablet Take 10 mg by mouth daily.    Marland Kitchen glipiZIDE (GLUCOTROL XL) 5 MG 24 hr tablet Take 5 mg by mouth 2 (two) times daily.    . hydrocortisone cream 1 % Apply 1 application topically daily as needed for itching.    . metFORMIN (  GLUCOPHAGE-XR) 500 MG 24 hr tablet Take 500 mg by mouth daily with supper.    . metoprolol tartrate (LOPRESSOR) 25 MG tablet Take 25 mg by mouth 2 (two) times daily.    . Multiple Vitamin (MULTIVITAMIN WITH MINERALS) TABS tablet Take 1 tablet by mouth daily.    Marland Kitchen omeprazole (PRILOSEC) 20 MG capsule Take 20 mg by mouth daily.    . sodium chloride (OCEAN) 0.65 % SOLN nasal spray Place 1 spray into both nostrils as needed for congestion.     No current facility-administered medications for this visit.     Allergies:   Codeine; Lipitor [atorvastatin]; Zetia [ezetimibe]; and Zocor [simvastatin]    Social History:  The patient  reports that he quit smoking about 25 years ago. His smoking use included Cigarettes. He has a 105.00 pack-year smoking history. He has never used  smokeless tobacco. He reports that he uses drugs, including Marijuana. He reports that he does not drink alcohol.   Family History:  The patient's family history includes CAD in his father; Heart attack (age of onset: 9) in his father.    ROS:  General:no colds or fevers, no weight changes Skin:no rashes or ulcers HEENT:no blurred vision, no congestion CV:see HPI PUL:see HPI GI:no diarrhea constipation or melena, no indigestion GU:no hematuria, no dysuria MS:no joint pain, no claudication, back grabbing pain Neuro:no syncope, no lightheadedness Endo:+ diabetes, no thyroid disease  Wt Readings from Last 3 Encounters:  09/06/16 173 lb 9.6 oz (78.7 kg)  08/21/16 171 lb 12.8 oz (77.9 kg)  06/24/16 175 lb (79.4 kg)     PHYSICAL EXAM: VS:  BP (!) 160/60 (BP Location: Left Arm, Patient Position: Sitting, Cuff Size: Normal)   Pulse (!) 44   Ht 5\' 9"  (1.753 m)   Wt 173 lb 9.6 oz (78.7 kg)   BMI 25.64 kg/m  , BMI Body mass index is 25.64 kg/m. General:Pleasant affect, NAD Skin:Warm and dry, brisk capillary refill HEENT:normocephalic, sclera clear, mucus membranes moist Neck:supple, no JVD, no bruits  Heart:S1S2 RRR without murmur, gallup, rub or click Lungs:clear without rales, rhonchi, or wheezes EPP:IRJJ, non tender, + BS, do not palpate liver spleen or masses Ext:no lower ext edema, 2+ pedal pulses, 2+ radial pulses Neuro:alert and oriented X 3, MAE, follows commands, + facial symmetry    EKG:  EKG is ordered today. The ekg ordered today demonstrates SB poor R wave progression.  HR 44   Recent Labs: No results found for requested labs within last 8760 hours.    Lipid Panel No results found for: CHOL, TRIG, HDL, CHOLHDL, VLDL, LDLCALC, LDLDIRECT     Other studies Reviewed: Additional studies/ records that were reviewed today include: . CTA  FINDINGS: Cardiovascular: This study was not performed as a CT angio chest. The pulmonary arteries are partially opacified  and no central abnormality is evident. The thoracic aorta opacifies and there is moderate thoracic aortic atherosclerosis present. No aneurysmal dilatation is seen. The mid ascending thoracic aorta measures 29 mm in diameter. Coronary artery calcifications are present primarily in the distribution of the left anterior descending and circumflex arteries. The heart is mildly enlarged.  Mediastinum/Nodes: No mediastinal or hilar adenopathy is seen. There are a few small mediastinal nodes present. A small enhancing nodule is noted in the right neck just below and posterior to the right lobe of thyroid which may represent a lymph node of 9 mm in short axis diameter.  Lungs/Pleura: On lung window images, no parenchymal infiltrate is seen  and there is no evidence of pleural effusion. No suspicious lung nodule is noted. A minimal ground-glass opacity is present in the left upper lobe on image 30 series 4 of doubtful significance. The central airway is patent.  Upper Abdomen: The portion of the upper abdomen that is visualized is unremarkable.  Musculoskeletal: The thoracic vertebrae are in normal alignment.  IMPRESSION: 1. No acute abnormality is seen on CT of the chest. 2. Coronary artery calcifications. 3. Minimal ground-glass opacity in the left upper lobe of doubtful significance. Initial follow-up with CT at 6-12 months is recommended to confirm persistence. If persistent, repeat CT is recommended every 2 years until 5 years of stability has been established. This recommendation follows the consensus statement: Guidelines for Management of Incidental Pulmonary Nodules Detected on CT Images: From the Fleischner Society 2017; Radiology 2017; 284:228-243.   ASSESSMENT AND PLAN:  1.  SOB at times only with rest or talking, never wakens from sleep.  He was walking 2 miles per day but had to stop more and more often with DOE.  Now does not walk.  Risk factors CAD, DM, HLD, + FH  premature CAD, hx tobacco use and coronary calcifications on CT of chest.  Will do stress myoview but if unable to get HR up will change to lexiscan.  Discussed all with Dr. Radford Pax.  - may require echo but will check nuc study first.   2. Bradycardia but asymptomatic   Though with exertion HR does not increase significantly -30 day event monitor.  Stop BB  3.  Tachycardia, he had palpitations prior to BB - most likely why he was placed on BB.  He may have tachy brady syndrome 30 day event monitor  4.  HTN BP is elevated will add amlodipine 10 mg for BP control.   5.  DM-2 for 15 to 20 years.  Stable followed by PCP  6. HLD will check lipids today he is NPO  7. Hx of Wegner's granulomatosis  8. Possible COPD vs. Asthma followed by Pulmonary. Was started on symbicort but has not seen much difference.      Current medicines are reviewed with the patient today.  The patient Has no concerns regarding medicines.  The following changes have been made:  See above Labs/ tests ordered today include:see above  Disposition:   FU:  see above  Signed, [name], NP  09/06/2016 8:18 AM    [facility] [address], [address], [employer] [place], [place] Phone: [phone]; Fax: [phone]

## 2016-09-06 ENCOUNTER — Encounter: Payer: Self-pay | Admitting: Cardiology

## 2016-09-06 ENCOUNTER — Ambulatory Visit (INDEPENDENT_AMBULATORY_CARE_PROVIDER_SITE_OTHER): Payer: Medicare Other | Admitting: Cardiology

## 2016-09-06 VITALS — BP 160/60 | HR 44 | Ht 69.0 in | Wt 173.6 lb

## 2016-09-06 DIAGNOSIS — I495 Sick sinus syndrome: Secondary | ICD-10-CM | POA: Diagnosis not present

## 2016-09-06 DIAGNOSIS — E782 Mixed hyperlipidemia: Secondary | ICD-10-CM

## 2016-09-06 DIAGNOSIS — J410 Simple chronic bronchitis: Secondary | ICD-10-CM | POA: Diagnosis not present

## 2016-09-06 DIAGNOSIS — I25119 Atherosclerotic heart disease of native coronary artery with unspecified angina pectoris: Secondary | ICD-10-CM

## 2016-09-06 DIAGNOSIS — I10 Essential (primary) hypertension: Secondary | ICD-10-CM

## 2016-09-06 LAB — COMPREHENSIVE METABOLIC PANEL
ALT: 20 IU/L (ref 0–44)
AST: 21 IU/L (ref 0–40)
Albumin/Globulin Ratio: 1.5 (ref 1.2–2.2)
Albumin: 4.3 g/dL (ref 3.6–4.8)
Alkaline Phosphatase: 82 IU/L (ref 39–117)
BILIRUBIN TOTAL: 0.3 mg/dL (ref 0.0–1.2)
BUN/Creatinine Ratio: 12 (ref 10–24)
BUN: 15 mg/dL (ref 8–27)
CALCIUM: 10.8 mg/dL — AB (ref 8.6–10.2)
CHLORIDE: 106 mmol/L (ref 96–106)
CO2: 21 mmol/L (ref 18–29)
Creatinine, Ser: 1.26 mg/dL (ref 0.76–1.27)
GFR, EST AFRICAN AMERICAN: 68 mL/min/{1.73_m2} (ref >59)
GFR, EST NON AFRICAN AMERICAN: 59 mL/min/{1.73_m2} — AB (ref >59)
GLUCOSE: 133 mg/dL — AB (ref 65–99)
Globulin, Total: 2.9 g/dL (ref 1.5–4.5)
Potassium: 5.2 mmol/L (ref 3.5–5.2)
Sodium: 138 mmol/L (ref 134–144)
TOTAL PROTEIN: 7.2 g/dL (ref 6.0–8.5)

## 2016-09-06 LAB — LIPID PANEL
Chol/HDL Ratio: 5.6 ratio — ABNORMAL HIGH (ref 0.0–5.0)
Cholesterol, Total: 185 mg/dL (ref 100–199)
HDL: 33 mg/dL — AB (ref >39)
LDL Calculated: 134 mg/dL — ABNORMAL HIGH (ref 0–99)
TRIGLYCERIDES: 89 mg/dL (ref 0–149)
VLDL Cholesterol Cal: 18 mg/dL (ref 5–40)

## 2016-09-06 LAB — TSH: TSH: 0.873 u[IU]/mL (ref 0.450–4.500)

## 2016-09-06 MED ORDER — AMLODIPINE BESYLATE 10 MG PO TABS
10.0000 mg | ORAL_TABLET | Freq: Every day | ORAL | 3 refills | Status: DC
Start: 2016-09-06 — End: 2017-05-06

## 2016-09-06 NOTE — Patient Instructions (Signed)
Medication Instructions:  Your physician has recommended you make the following change in your medication: 1.STOP taking your metoprolol. 2.) START taking the Amlodipine 10 mg daily.   Labwork: Your physician recommends that you return for lab work today for CMET, TSH, LIPID PANEL  Testing/Procedures: Your physician has requested that you have en exercise stress myoview. For further information please visit [email or web address]. Please follow instruction sheet, as given.  Your physician has recommended that you wear an event monitor. Event monitors are medical devices that record the heart's electrical activity. Doctors most often [place] these monitors to diagnose arrhythmias. Arrhythmias are problems with the speed or rhythm of the heartbeat. The monitor is a small, portable device. You can wear one while you do your normal daily activities. This is usually used to diagnose what is causing palpitations/syncope (passing out).    Follow-Up: Your physician recommends that you schedule a follow-up appointment in: with Dr.  Radford Pax 5-6 weeks after event monitor.   Any Other Special Instructions Will Be Listed Below (If Applicable).     If you need a refill on your cardiac medications before your next appointment, please call your pharmacy.  Thank you for choosing [facility]

## 2016-09-11 ENCOUNTER — Ambulatory Visit (INDEPENDENT_AMBULATORY_CARE_PROVIDER_SITE_OTHER): Payer: Medicare Other | Admitting: Internal Medicine

## 2016-09-11 ENCOUNTER — Encounter: Payer: Self-pay | Admitting: Acute Care

## 2016-09-11 ENCOUNTER — Ambulatory Visit (INDEPENDENT_AMBULATORY_CARE_PROVIDER_SITE_OTHER): Payer: Medicare Other | Admitting: Acute Care

## 2016-09-11 VITALS — BP 140/74 | HR 64 | Ht 68.0 in | Wt 172.4 lb

## 2016-09-11 DIAGNOSIS — Z8679 Personal history of other diseases of the circulatory system: Secondary | ICD-10-CM

## 2016-09-11 DIAGNOSIS — R0600 Dyspnea, unspecified: Secondary | ICD-10-CM

## 2016-09-11 DIAGNOSIS — R0689 Other abnormalities of breathing: Secondary | ICD-10-CM

## 2016-09-11 DIAGNOSIS — R911 Solitary pulmonary nodule: Secondary | ICD-10-CM

## 2016-09-11 MED ORDER — BUDESONIDE-FORMOTEROL FUMARATE 160-4.5 MCG/ACT IN AERO: 2.0000 | INHALATION_SPRAY | Freq: Two times a day (BID) | RESPIRATORY_TRACT | 5 refills | Status: DC

## 2016-09-11 NOTE — Assessment & Plan Note (Signed)
Follow-up CT chest without contrast in December 2018

## 2016-09-11 NOTE — Progress Notes (Signed)
History of Present Illness Brian Hudson is a 66 y.o. male former smoker quit in 1994 with history of Wegner's Disease since 2004, and dyspnea on exertion.  09/11/2016 PFT' Follow Up: Pt. Was seen by Dr. Chase Hudson 08/21/2016 for  dyspnea .Plan at that time was as follows: PLAN  - start symbicort 2 puff twice daily - take sample worth 1 month  - do ono test room air next few to several days  - refer cardiology  - Dr Brian Hudson or [name] first available - sign record releast to get more info on wegner from Dr Trudie Hudson esp blood work  - do full PFT  Patient presents today for follow-up. Patient has been compliant with Symbicort since 08/21/2016. He states he cannot tell any significant difference. In his dyspnea. He states he continues to get short of breath at times with exercise and also when talking. He states his shortness of breath does not wake him from sleep. He continues to be unable to walk as he has done in the past. He was seen by Brian Hudson at [name] heart care on 09/06/2016. Of note there is a significant family history which includes coronary artery disease in his father, who had MI at the age of 46.Risk factors include CAD, DM, HLD, + FH premature CAD, hx tobacco use and coronary calcifications on CT of chest.The patient is Scheduled for an exercise stress test 6/14 and an event monitor 6/14 to rule out tachy bradycardia syndrome. He states he does have secretions in the morning, yellowish with occasional blood tinged secretions, that self resolves..Lab work has not been sent from Dr. Trudie Hudson In [place], therefore I am unable to review. Patient denies any fever, chest pain, orthopnea, or hemoptysis.   Test Results:  CT angiogram: 06/11/2016 No acute abnormality is seen on CT of the chest. 2. Coronary artery calcifications. 3. Minimal ground-glass opacity in the left upper lobe of doubtful significance. Initial follow-up with CT at 6-12 months is recommended to confirm  persistence  Ambulatory saturation: 08/21/2016 185 feet 3 laps on room air. Did not desaturate. Resting pulse ox was 100%. Final pulse ox was 90%. Heart rate response 50/m at rest is 62/m at peak exertion.  EKG 09/06/2016 Sinus bradycardia with poor R-wave progression, heart rate 44  No flowsheet data found.  BMP Latest Ref Rng & Units 09/06/2016  Glucose 65 - 99 mg/dL 133(H)  BUN 8 - 27 mg/dL 15  Creatinine 0.76 - 1.27 mg/dL 1.26  BUN/Creat Ratio 10 - 24 12  Sodium 134 - 144 mmol/L 138  Potassium 3.5 - 5.2 mmol/L 5.2  Chloride 96 - 106 mmol/L 106  CO2 18 - 29 mmol/L 21  Calcium 8.6 - 10.2 mg/dL 10.8(H)       Past medical hx Past Medical History:  Diagnosis Date  . Anxiety   . Concussion   . DM (diabetes mellitus) ([name])    type 2  . ED (erectile dysfunction)   . Fracture 2010   L 3 and L 4 healed with brace  . GERD (gastroesophageal reflux disease)   . Hyperlipidemia   . IBS (irritable bowel syndrome)   . Palpitations    none in last few yrs  . Prostate cancer ([place]) 2007  . Sigmoid diverticulosis   . Wegener's granulomatosis ([name])    followed by dr Brian Hudson     Social History  Substance Use Topics  . Smoking status: Former Smoker    Packs/day: 3.00    Years: 35.00  Types: Cigarettes    Quit date: 04/09/1991  . Smokeless tobacco: Never Used  . Alcohol use No    Tobacco Cessation: Former smoker quit in 1994, 105-pack-year smoking history (3 packs per day 35 years)  Past surgical hx, Family hx, Social hx all reviewed.  Current Outpatient Prescriptions on File Prior to Visit  Medication Sig  . acetaminophen (TYLENOL) 325 MG tablet Take 650 mg by mouth as needed for mild pain or moderate pain.  Marland Kitchen amLODipine (NORVASC) 10 MG tablet Take 1 tablet (10 mg total) by mouth daily.  Marland Kitchen escitalopram (LEXAPRO) 10 MG tablet Take 10 mg by mouth daily.  Marland Kitchen glipiZIDE (GLUCOTROL XL) 5 MG 24 hr tablet Take 5 mg by mouth 2 (two) times daily.  . hydrocortisone cream 1 %  Apply 1 application topically daily as needed for itching.  . metFORMIN (GLUCOPHAGE-XR) 500 MG 24 hr tablet Take 500 mg by mouth daily with supper.  . Multiple Vitamin (MULTIVITAMIN WITH MINERALS) TABS tablet Take 1 tablet by mouth daily.  Marland Kitchen omeprazole (PRILOSEC) 20 MG capsule Take 20 mg by mouth daily.  . sodium chloride (OCEAN) 0.65 % SOLN nasal spray Place 1 spray into both nostrils as needed for congestion.   No current facility-administered medications on file prior to visit.      Allergies  Allergen Reactions  . Codeine     "jittery"  . Lipitor [Atorvastatin]     arthralgia  . Zetia [Ezetimibe]     arthralgia  . Zocor [Simvastatin]     arthralgia    Review Of Systems:  Constitutional:   No  weight loss, night sweats,  Fevers, chills, fatigue, or  lassitude.  HEENT:   No headaches,  Difficulty swallowing,  Tooth/dental problems, or  Sore throat,                No sneezing, itching, ear ache, nasal congestion, post nasal drip,   CV:  No chest pain,  Orthopnea, PND, swelling in lower extremities, anasarca, dizziness, palpitations, syncope.   GI  No heartburn, indigestion, abdominal pain, nausea, vomiting, diarrhea, change in bowel habits, loss of appetite, bloody stools.   Resp: + shortness of breath with exertion or at rest.  No excess mucus, no productive cough,  No non-productive cough,  No coughing up of blood.  No change in color of mucus.  No wheezing.  No chest wall deformity  Skin: no rash or lesions.  GU: no dysuria, change in color of urine, no urgency or frequency.  No flank pain, no hematuria   MS:  No joint pain or swelling.  No decreased range of motion.  No back pain.  Psych:  No change in mood or affect. No depression or anxiety.  No memory loss.   Vital Signs BP 140/74 (BP Location: Right Arm, Cuff Size: Normal)   Pulse 64   Ht 5\' 8"  (1.727 m)   Wt 172 lb 6.4 oz (78.2 kg)   SpO2 97%   BMI 26.21 kg/m  Body mass index is 26.21 kg/m.   Physical  Exam:  General- No distress,  A&Ox3, pleasant affect ENT: No sinus tenderness, TM clear, pale nasal mucosa, no oral exudate,no post nasal drip, no LAN, normocephalic, atraumatic Cardiac: S1, S2, regular rate and rhythm, no murmur Chest: No wheeze/ rales/ dullness; no accessory muscle use, no nasal flaring, no sternal retractions Abd.: Soft Non-tender, bowel sounds positive, nondistended. Ext: No clubbing cyanosis, edema, 2+ pedal pulses Neuro:  normal strength, alert and oriented 3,  moving all extremities 4, follows commands, positive facial symmetry Skin: No rashes, warm and dry Psych: normal mood and behavior   Assessment/Plan  Nodule of left lung Follow-up CT chest without contrast in December 2018  History of Wegener's granulomatosis Continue DOE No significant improvement on Symbicort, but willing to continue. Significant deconditioning which may be a contributor Plan We will do a CT chest without contrast  as follow up to the March scan in 03/2017. PFT's are normal with the exception of decreased diffusion capacity  which may be due to anemia. We will call you with the results of your O&O test. We will send in a prescription for the Symbicort. 2 puffs twice daily Rinse mouth after use Follow up with Dr. Chase Hudson in 4 months after cardiology work up is complete Please contact office for sooner follow up if symptoms do not improve or worsen or seek emergency care .     Magdalen Spatz, NP 09/11/2016  8:14 PM

## 2016-09-11 NOTE — Assessment & Plan Note (Addendum)
Continue DOE No significant improvement on Symbicort, but willing to continue. Significant deconditioning which may be a contributor Plan We will do a CT chest without contrast  as follow up to the March scan in 03/2017. PFT's are normal with the exception of decreased diffusion capacity  which may be due to anemia. We will call you with the results of your O&O test. We will send in a prescription for the Symbicort. 2 puffs twice daily Rinse mouth after use Follow up with [name] in 4 months after cardiology work up is complete Please contact office for sooner follow up if symptoms do not improve or worsen or seek emergency care .

## 2016-09-11 NOTE — Progress Notes (Signed)
PFT done today. 

## 2016-09-11 NOTE — Patient Instructions (Addendum)
It is nice to meet you today. We will do a CT chest without contrast  as follow up to the March scan in 03/2017. PFT's are normal with the exception of decreased diffusion capacity  which may be due to anemia. We will call you with the results of your O&O test. We will send in a prescription for the Symbicort. 2 puffs twice daily Rinse mouth after use Follow up with [name] in 4 months after cardiology work up is complete Please contact office for sooner follow up if symptoms do not improve or worsen or seek emergency care .

## 2016-09-12 ENCOUNTER — Telehealth: Payer: Self-pay | Admitting: Internal Medicine

## 2016-09-12 NOTE — Telephone Encounter (Signed)
onoo 09/03/16 with pulse ox </= 88% at 4 seco. Does not need nocturnal o2  Thanks  Dr. Brand Males, M.D., [facility].C.P Pulmonary and Critical Care Medicine Staff Physician [place] [facility] Pager: [phone], If no answer or between  15:00h - 7:00h: call [phone]  09/12/2016 1:29 AM

## 2016-09-12 NOTE — Telephone Encounter (Signed)
lmtcb for pt.  

## 2016-09-16 NOTE — Telephone Encounter (Signed)
Spoke with pt and notified of results per Dr. Ramaswamy. Pt verbalized understanding and denied any questions.  

## 2016-09-17 ENCOUNTER — Telehealth (HOSPITAL_COMMUNITY): Payer: Self-pay

## 2016-09-17 NOTE — Telephone Encounter (Signed)
Patient given detailed instructions per Myocardial Perfusion Study Information Sheet for the test on 6/14 at 10:00. Patient notified to arrive 15 minutes early and that it is imperative to arrive on time for appointment to keep from having the test rescheduled.  If you need to cancel or reschedule your appointment, please call the office within 24 hours of your appointment. . Patient verbalized understanding.ehk

## 2016-09-19 ENCOUNTER — Ambulatory Visit (INDEPENDENT_AMBULATORY_CARE_PROVIDER_SITE_OTHER): Payer: Medicare Other | Admitting: Cardiology

## 2016-09-19 ENCOUNTER — Ambulatory Visit (HOSPITAL_COMMUNITY): Payer: Medicare Other | Attending: Internal Medicine

## 2016-09-19 ENCOUNTER — Other Ambulatory Visit: Payer: Self-pay | Admitting: Cardiology

## 2016-09-19 ENCOUNTER — Encounter: Payer: Self-pay | Admitting: Cardiology

## 2016-09-19 ENCOUNTER — Ambulatory Visit: Payer: Medicare Other

## 2016-09-19 VITALS — BP 112/62 | HR 58 | Ht 69.0 in | Wt 172.6 lb

## 2016-09-19 DIAGNOSIS — Z01818 Encounter for other preprocedural examination: Secondary | ICD-10-CM

## 2016-09-19 DIAGNOSIS — I25119 Atherosclerotic heart disease of native coronary artery with unspecified angina pectoris: Secondary | ICD-10-CM | POA: Diagnosis not present

## 2016-09-19 DIAGNOSIS — R000 Tachycardia, unspecified: Secondary | ICD-10-CM

## 2016-09-19 DIAGNOSIS — I2510 Atherosclerotic heart disease of native coronary artery without angina pectoris: Secondary | ICD-10-CM | POA: Diagnosis not present

## 2016-09-19 DIAGNOSIS — I2584 Coronary atherosclerosis due to calcified coronary lesion: Secondary | ICD-10-CM

## 2016-09-19 DIAGNOSIS — R9439 Abnormal result of other cardiovascular function study: Secondary | ICD-10-CM

## 2016-09-19 DIAGNOSIS — E782 Mixed hyperlipidemia: Secondary | ICD-10-CM | POA: Diagnosis not present

## 2016-09-19 DIAGNOSIS — R0609 Other forms of dyspnea: Secondary | ICD-10-CM

## 2016-09-19 DIAGNOSIS — R001 Bradycardia, unspecified: Secondary | ICD-10-CM

## 2016-09-19 DIAGNOSIS — R0600 Dyspnea, unspecified: Secondary | ICD-10-CM

## 2016-09-19 LAB — BASIC METABOLIC PANEL
BUN/Creatinine Ratio: 11 (ref 10–24)
BUN: 13 mg/dL (ref 8–27)
CALCIUM: 11.1 mg/dL — AB (ref 8.6–10.2)
CHLORIDE: 104 mmol/L (ref 96–106)
CO2: 25 mmol/L (ref 20–29)
Creatinine, Ser: 1.14 mg/dL (ref 0.76–1.27)
GFR calc Af Amer: 77 mL/min/{1.73_m2} (ref >59)
GFR calc non Af Amer: 66 mL/min/{1.73_m2} (ref >59)
Glucose: 153 mg/dL — ABNORMAL HIGH (ref 65–99)
POTASSIUM: 4.4 mmol/L (ref 3.5–5.2)
Sodium: 135 mmol/L (ref 134–144)

## 2016-09-19 LAB — MYOCARDIAL PERFUSION IMAGING
CHL CUP NUCLEAR SDS: 0
CHL CUP RESTING HR STRESS: 49 {beats}/min
CSEPPHR: 144 {beats}/min
Estimated workload: 11.1 METS
Exercise duration (min): 9 min
Exercise duration (sec): 46 s
LHR: 0.24
LVDIAVOL: 112 mL (ref 62–150)
LVSYSVOL: 52 mL
MPHR: 153 {beats}/min
Percent HR: 94 %
RPE: 19
SRS: 0
SSS: 0
TID: 0.99

## 2016-09-19 LAB — PROTIME-INR
INR: 1 (ref 0.8–1.2)
PROTHROMBIN TIME: 10.6 s (ref 9.1–12.0)

## 2016-09-19 LAB — CBC
HEMOGLOBIN: 13.9 g/dL (ref 13.0–17.7)
Hematocrit: 39.6 % (ref 37.5–51.0)
MCH: 29.3 pg (ref 26.6–33.0)
MCHC: 35.1 g/dL (ref 31.5–35.7)
MCV: 84 fL (ref 79–97)
Platelets: 144 10*3/uL — ABNORMAL LOW (ref 150–379)
RBC: 4.74 x10E6/uL (ref 4.14–5.80)
RDW: 13.8 % (ref 12.3–15.4)
WBC: 9.3 10*3/uL (ref 3.4–10.8)

## 2016-09-19 MED ORDER — TECHNETIUM TC 99M TETROFOSMIN IV KIT
10.8000 | PACK | Freq: Once | INTRAVENOUS | Status: AC | PRN
  Administered 2016-09-19: 10.8 via INTRAVENOUS
  Filled 2016-09-19: qty 11

## 2016-09-19 MED ORDER — TECHNETIUM TC 99M TETROFOSMIN IV KIT
32.1000 | PACK | Freq: Once | INTRAVENOUS | Status: AC | PRN
  Administered 2016-09-19: 32.1 via INTRAVENOUS
  Filled 2016-09-19: qty 33

## 2016-09-19 NOTE — Progress Notes (Signed)
Cardiology Office Note:    Date:  09/19/2016   ID:  [name], DOB 07/08/1950, MRN [id number]  PCP:  Josetta Huddle, MD  Cardiologist:  Candee Furbish, MD    Referring MD: Josetta Huddle, MD     History of Present Illness:    Brian Hudson is a 66 y.o. male with diabetes who underwent nuclear stress test at the request of [name], NP that demonstrated a markedly abnormal exercise treadmill. Marked ST segment depression, 2 mm diffuse. AVR elevation. His nuclear imaging was normal however. EF 53%. No perfusion defects. No transient ischemic dilatation.  The stress test was performed because of coronary calcification seen on CT scan. [name] also had seen him and noted that he did not desaturate. He was concerned about possible chronotropic incompetence. His heart rate at baseline was in the 40s.  His father had heart attack at age 61. Coronary calcifications are noted in the LAD and circumflex arteries.  I had seen him back in the cardiology several years ago.  Past Medical History:  Diagnosis Date  . Anxiety   . Concussion   . DM (diabetes mellitus) ([place])    type 2  . ED (erectile dysfunction)   . Fracture 2010   L 3 and L 4 healed with brace  . GERD (gastroesophageal reflux disease)   . Hyperlipidemia   . IBS (irritable bowel syndrome)   . Palpitations    none in last few yrs  . Prostate cancer ([place]) 2007  . Sigmoid diverticulosis   . Wegener's granulomatosis ([place])    followed by dr Berna Bue    Past Surgical History:  Procedure Laterality Date  . COLONOSCOPY N/A 06/24/2016   Procedure: COLONOSCOPY with propofol;  Surgeon: Garlan Fair, MD;  Location: [facility] ENDOSCOPY;  Service: Endoscopy;  Laterality: N/A;  . colonscopy  2008  . INSERTION PROSTATE RADIATION SEED  2007  . ROTATOR CUFF REPAIR Right     Current Medications: Current Meds  Medication Sig  . acetaminophen (TYLENOL) 325 MG tablet Take 650 mg by mouth as needed for mild pain or moderate pain.   Marland Kitchen amLODipine (NORVASC) 10 MG tablet Take 1 tablet (10 mg total) by mouth daily.  . budesonide-formoterol (SYMBICORT) 160-4.5 MCG/ACT inhaler Inhale 2 puffs into the lungs 2 (two) times daily.  Marland Kitchen escitalopram (LEXAPRO) 10 MG tablet Take 10 mg by mouth daily.  Marland Kitchen glipiZIDE (GLUCOTROL XL) 5 MG 24 hr tablet Take 5 mg by mouth 2 (two) times daily.  . hydrocortisone cream 1 % Apply 1 application topically daily as needed for itching.  . metFORMIN (GLUCOPHAGE-XR) 500 MG 24 hr tablet Take 500 mg by mouth daily with supper.  . Multiple Vitamin (MULTIVITAMIN WITH MINERALS) TABS tablet Take 1 tablet by mouth daily.  Marland Kitchen omeprazole (PRILOSEC) 20 MG capsule Take 20 mg by mouth daily.  . sodium chloride (OCEAN) 0.65 % SOLN nasal spray Place 1 spray into both nostrils as needed for congestion.     Allergies:   Codeine; Lipitor [atorvastatin]; Zetia [ezetimibe]; and Zocor [simvastatin]   Social History   Social History  . Marital status: Divorced    Spouse name: N/A  . Number of children: N/A  . Years of education: N/A   Occupational History  . Contractor    Social History Main Topics  . Smoking status: Former Smoker    Packs/day: 3.00    Years: 35.00    Types: Cigarettes    Quit date: 04/09/1991  . Smokeless  tobacco: Never Used  . Alcohol use No  . Drug use: Yes    Types: Marijuana     Comment:   marijuana last used 2- weeks ago as of 06-20-16  . Sexual activity: Not Asked   Other Topics Concern  . None   Social History Narrative  . None     Family History: The patient's family history includes CAD in his father; Cancer in his brother; Diabetic kidney disease in his mother; Healthy in his sister and sister; Heart attack (age of onset: 47) in his father. ROS:   Please see the history of present illness.     All other systems reviewed and are negative.  EKGs/Labs/Other Studies Reviewed:    The following studies were reviewed today: Nuclear stress test as described above. Markedly  abnormal ETT with ST segment depression. Perfusion images are normal.  EKG:  EKG is not ordered today. Baseline ECG from exercise treadmill test shows sinus rhythm heart rate 49. Personally viewed.  Recent Labs: 09/06/2016: ALT 20; BUN 15; Creatinine, Ser 1.26; Potassium 5.2; Sodium 138; TSH 0.873   Recent Lipid Panel    Component Value Date/Time   CHOL 185 09/06/2016 0912   TRIG 89 09/06/2016 0912   HDL 33 (L) 09/06/2016 0912   CHOLHDL 5.6 (H) 09/06/2016 0912   LDLCALC 134 (H) 09/06/2016 0912    Physical Exam:    VS:  BP 112/62   Pulse (!) 58   Ht 5\' 9"  (1.753 m)   Wt 172 lb 9.6 oz (78.3 kg)   BMI 25.49 kg/m     Wt Readings from Last 3 Encounters:  09/19/16 172 lb 9.6 oz (78.3 kg)  09/19/16 173 lb (78.5 kg)  09/11/16 172 lb 6.4 oz (78.2 kg)     GEN:  Well nourished, well developed in no acute distress HEENT: Normal NECK: No JVD; No carotid bruits LYMPHATICS: No lymphadenopathy CARDIAC: RRR, no murmurs, rubs, gallops RESPIRATORY:  Clear to auscultation without rales, wheezing or rhonchi  ABDOMEN: Soft, non-tender, non-distended MUSCULOSKELETAL:  No edema; No deformity  SKIN: Warm and dry NEUROLOGIC:  Alert and oriented x 3 PSYCHIATRIC:  Normal affect   ASSESSMENT:    1. Abnormal cardiovascular stress test   2. Pre-operative exam   3. Coronary artery calcification   4. Mixed hyperlipidemia    PLAN:    In order of problems listed above:  Markedly abnormal exercise treadmill test/stress test  - With LAD and circumflex calcification, concerning for multivessel coronary artery disease with marketed 2 mm ST segment depression diffusely with aVR elevation. He was significant short of breath, turned pale. His blood pressure did elevate appropriately. He exercised for a total of 9 minutes 46 seconds but had to be held up at the end.  - His perfusion images are normal and there is no dilatation and EF is 53%. With his symptoms and treadmill test however I do believe that  he should proceed with cardiac catheterization.  Coronary artery calcification  - LAD and circumflex. Getting cardiac catheterization as above. Risks and benefits of procedure including stroke, heart attack, death, renal impairment explained.  Diabetes  - Holding metformin prior to cardiac catheterization.  He had normal chronotropic competence airing exercise treadmill test.  Medication Adjustments/Labs and Tests Ordered: Current medicines are reviewed at length with the patient today.  Concerns regarding medicines are outlined above. Labs and tests ordered and medication changes are outlined in the patient instructions below:  Patient Instructions  Medication Instructions:  The current  medical regimen is effective;  continue present plan and medications.  Labwork: Please have blood work today  (BMP, CBC and PT/INR)  Testing/Procedures: Your physician has requested that you have a cardiac catheterization. Cardiac catheterization is used to diagnose and/or treat various heart conditions. Doctors may recommend this procedure for a number of different reasons. The most common reason is to evaluate chest pain. Chest pain can be a symptom of coronary artery disease (CAD), and cardiac catheterization can show whether plaque is narrowing or blocking your heart's arteries. This procedure is also used to evaluate the valves, as well as measure the blood flow and oxygen levels in different parts of your heart. For further information please visit [email or web address][facility][email or web address]. Please follow instruction sheet, as given.  Follow-Up: Follow up after cardiac cath as instructed.  If you need a refill on your cardiac medications before your next appointment, please call your pharmacy.  Thank you for choosing [facility]!!      [place] OFFICE [address] [place] [address] [place] [address] Dept: [phone] Loc:  [place]  09/19/2016  You are scheduled for a cardiac cath on Friday, June 15  with Dr. Ellyn Hack.  1. Please arrive at the [facility] (Main Entrance A) at [facility]: [address] [place], [place] [address] at 5:30 AM (two hours before your procedure to ensure your preparation). Free valet parking service is available.   Special note: Every effort is made to have your procedure done on time. Please understand that emergencies sometimes delay scheduled procedures.  2. Diet: nothing to eat or drink after midnight.  3. Labs: please have blood work today.  4. Medication instructions in preparation for your procedure: Do not take Metformin 24 hours before and 48 hours after your cath. Do not take Glipizide the morning of. You may take your other medications with sips of water.  5. Plan for one night stay--bring personal belongings. 6. Bring a current list of your medications and current insurance cards. 7. You MUST have a responsible person to drive you home. 8. Someone MUST be with you the first 24 hours after you arrive home or your discharge will be delayed. 9. Please wear clothes that are easy to get on and off and wear slip-on shoes.  Thank you for allowing [place] to care for you!   -- [facility] Invasive Cardiovascular services     Signed, Candee Furbish, MD  09/19/2016 2:19 PM    [place]

## 2016-09-19 NOTE — Patient Instructions (Signed)
Medication Instructions:  The current medical regimen is effective;  continue present plan and medications.  Labwork: Please have blood work today  (BMP, CBC and PT/INR)  Testing/Procedures: Your physician has requested that you have a cardiac catheterization. Cardiac catheterization is used to diagnose and/or treat various heart conditions. Doctors may recommend this procedure for a number of different reasons. The most common reason is to evaluate chest pain. Chest pain can be a symptom of coronary artery disease (CAD), and cardiac catheterization can show whether plaque is narrowing or blocking your heart's arteries. This procedure is also used to evaluate the valves, as well as measure the blood flow and oxygen levels in different parts of your heart. For further information please visit [email or web address][facility][email or web address]. Please follow instruction sheet, as given.  Follow-Up: Follow up after cardiac cath as instructed.  If you need a refill on your cardiac medications before your next appointment, please call your pharmacy.  Thank you for choosing [facility]!!      [place] OFFICE [address] [place] [address] [place] [address] Dept: [phone] Loc: [place]  09/19/2016  You are scheduled for a cardiac cath on Friday, June 15  with Dr. Ellyn Hack.  1. Please arrive at the [facility] (Main Entrance A) at [facility]: [address] [place], [place] [address] at 5:30 AM (two hours before your procedure to ensure your preparation). Free valet parking service is available.   Special note: Every effort is made to have your procedure done on time. Please understand that emergencies sometimes delay scheduled procedures.  2. Diet: nothing to eat or drink after midnight.  3. Labs: please have blood work today.  4. Medication instructions in preparation for your procedure: Do not  take Metformin 24 hours before and 48 hours after your cath. Do not take Glipizide the morning of. You may take your other medications with sips of water.  5. Plan for one night stay--bring personal belongings. 6. Bring a current list of your medications and current insurance cards. 7. You MUST have a responsible person to drive you home. 8. Someone MUST be with you the first 24 hours after you arrive home or your discharge will be delayed. 9. Please wear clothes that are easy to get on and off and wear slip-on shoes.  Thank you for allowing [place] to care for you!   -- [place] Invasive Cardiovascular services

## 2016-09-20 ENCOUNTER — Other Ambulatory Visit: Payer: Self-pay

## 2016-09-20 ENCOUNTER — Encounter (HOSPITAL_COMMUNITY)
Admission: RE | Disposition: A | Discharge status: Home / Self Care | Payer: Self-pay | Source: Ambulatory Visit | Attending: Cardiology

## 2016-09-20 ENCOUNTER — Ambulatory Visit (HOSPITAL_COMMUNITY)
Admission: RE | Admit: 2016-09-20 | Discharge: 2016-09-20 | Disposition: A | Discharge status: Home / Self Care | Payer: Medicare Other | Source: Ambulatory Visit | Attending: Cardiology | Admitting: Cardiology

## 2016-09-20 ENCOUNTER — Encounter (HOSPITAL_COMMUNITY): Payer: Self-pay | Admitting: Physician Assistant

## 2016-09-20 DIAGNOSIS — J449 Chronic obstructive pulmonary disease, unspecified: Secondary | ICD-10-CM | POA: Insufficient documentation

## 2016-09-20 DIAGNOSIS — Z888 Allergy status to other drugs, medicaments and biological substances status: Secondary | ICD-10-CM | POA: Insufficient documentation

## 2016-09-20 DIAGNOSIS — I2584 Coronary atherosclerosis due to calcified coronary lesion: Secondary | ICD-10-CM | POA: Insufficient documentation

## 2016-09-20 DIAGNOSIS — K219 Gastro-esophageal reflux disease without esophagitis: Secondary | ICD-10-CM | POA: Diagnosis not present

## 2016-09-20 DIAGNOSIS — Z8546 Personal history of malignant neoplasm of prostate: Secondary | ICD-10-CM | POA: Diagnosis not present

## 2016-09-20 DIAGNOSIS — R9439 Abnormal result of other cardiovascular function study: Secondary | ICD-10-CM | POA: Diagnosis present

## 2016-09-20 DIAGNOSIS — R0609 Other forms of dyspnea: Secondary | ICD-10-CM | POA: Diagnosis present

## 2016-09-20 DIAGNOSIS — Z8739 Personal history of other diseases of the musculoskeletal system and connective tissue: Secondary | ICD-10-CM

## 2016-09-20 DIAGNOSIS — Z7984 Long term (current) use of oral hypoglycemic drugs: Secondary | ICD-10-CM | POA: Insufficient documentation

## 2016-09-20 DIAGNOSIS — Z79899 Other long term (current) drug therapy: Secondary | ICD-10-CM | POA: Diagnosis not present

## 2016-09-20 DIAGNOSIS — E782 Mixed hyperlipidemia: Secondary | ICD-10-CM | POA: Diagnosis not present

## 2016-09-20 DIAGNOSIS — Z885 Allergy status to narcotic agent status: Secondary | ICD-10-CM | POA: Diagnosis not present

## 2016-09-20 DIAGNOSIS — I2510 Atherosclerotic heart disease of native coronary artery without angina pectoris: Secondary | ICD-10-CM | POA: Diagnosis not present

## 2016-09-20 DIAGNOSIS — Z8249 Family history of ischemic heart disease and other diseases of the circulatory system: Secondary | ICD-10-CM | POA: Insufficient documentation

## 2016-09-20 DIAGNOSIS — Z8679 Personal history of other diseases of the circulatory system: Secondary | ICD-10-CM

## 2016-09-20 DIAGNOSIS — R9431 Abnormal electrocardiogram [ECG] [EKG]: Secondary | ICD-10-CM | POA: Diagnosis present

## 2016-09-20 DIAGNOSIS — C61 Malignant neoplasm of prostate: Secondary | ICD-10-CM | POA: Diagnosis present

## 2016-09-20 DIAGNOSIS — E119 Type 2 diabetes mellitus without complications: Secondary | ICD-10-CM | POA: Diagnosis not present

## 2016-09-20 DIAGNOSIS — Z955 Presence of coronary angioplasty implant and graft: Secondary | ICD-10-CM

## 2016-09-20 DIAGNOSIS — R0600 Dyspnea, unspecified: Secondary | ICD-10-CM | POA: Diagnosis present

## 2016-09-20 DIAGNOSIS — Z87891 Personal history of nicotine dependence: Secondary | ICD-10-CM | POA: Diagnosis not present

## 2016-09-20 DIAGNOSIS — E785 Hyperlipidemia, unspecified: Secondary | ICD-10-CM | POA: Diagnosis present

## 2016-09-20 DIAGNOSIS — I10 Essential (primary) hypertension: Secondary | ICD-10-CM | POA: Diagnosis not present

## 2016-09-20 DIAGNOSIS — Z9861 Coronary angioplasty status: Secondary | ICD-10-CM

## 2016-09-20 HISTORY — PX: CORONARY STENT INTERVENTION: CATH118234

## 2016-09-20 HISTORY — DX: Essential (primary) hypertension: I10

## 2016-09-20 HISTORY — DX: Atherosclerotic heart disease of native coronary artery without angina pectoris: I25.10

## 2016-09-20 HISTORY — PX: INTRAVASCULAR PRESSURE WIRE/FFR STUDY: CATH118243

## 2016-09-20 HISTORY — PX: LEFT HEART CATH AND CORONARY ANGIOGRAPHY: CATH118249

## 2016-09-20 LAB — GLUCOSE, CAPILLARY
Glucose-Capillary: 135 mg/dL — ABNORMAL HIGH (ref 65–99)
Glucose-Capillary: 167 mg/dL — ABNORMAL HIGH (ref 65–99)

## 2016-09-20 LAB — POCT ACTIVATED CLOTTING TIME
Activated Clotting Time: 241 seconds
Activated Clotting Time: 257 seconds
Activated Clotting Time: 345 seconds

## 2016-09-20 SURGERY — LEFT HEART CATH AND CORONARY ANGIOGRAPHY
Anesthesia: LOCAL

## 2016-09-20 MED ORDER — HEPARIN (PORCINE) IN NACL 2-0.9 UNIT/ML-% IJ SOLN
INTRAMUSCULAR | Status: AC | PRN
  Administered 2016-09-20: 1000 mL

## 2016-09-20 MED ORDER — LIDOCAINE HCL 1 % IJ SOLN
INTRAMUSCULAR | Status: AC
  Filled 2016-09-20: qty 20

## 2016-09-20 MED ORDER — SODIUM CHLORIDE 0.9% FLUSH: 3.0000 mL | Freq: Two times a day (BID) | INTRAVENOUS | Status: DC

## 2016-09-20 MED ORDER — IOPAMIDOL (ISOVUE-370) INJECTION 76%
INTRAVENOUS | Status: AC
  Filled 2016-09-20: qty 50

## 2016-09-20 MED ORDER — ACETAMINOPHEN 325 MG PO TABS: 650.0000 mg | ORAL_TABLET | ORAL | Status: DC | PRN

## 2016-09-20 MED ORDER — HEPARIN SODIUM (PORCINE) 1000 UNIT/ML IJ SOLN
INTRAMUSCULAR | Status: DC | PRN
  Administered 2016-09-20 (×2): 4500 [IU] via INTRAVENOUS
  Administered 2016-09-20: 3000 [IU] via INTRAVENOUS

## 2016-09-20 MED ORDER — LIDOCAINE HCL (PF) 1 % IJ SOLN
INTRAMUSCULAR | Status: DC | PRN
  Administered 2016-09-20: 2 mL via INTRADERMAL

## 2016-09-20 MED ORDER — IOPAMIDOL (ISOVUE-370) INJECTION 76%
INTRAVENOUS | Status: DC | PRN
  Administered 2016-09-20: 165 mL via INTRA_ARTERIAL

## 2016-09-20 MED ORDER — SODIUM CHLORIDE 0.9 % WEIGHT BASED INFUSION
3.0000 mL/kg/h | INTRAVENOUS | Status: DC
  Administered 2016-09-20: 3 mL/kg/h via INTRAVENOUS

## 2016-09-20 MED ORDER — VERAPAMIL HCL 2.5 MG/ML IV SOLN
INTRAVENOUS | Status: DC | PRN
  Administered 2016-09-20: 10 mL via INTRA_ARTERIAL

## 2016-09-20 MED ORDER — CLOPIDOGREL BISULFATE 75 MG PO TABS: 75.0000 mg | ORAL_TABLET | Freq: Every day | ORAL | 0 refills | Status: DC

## 2016-09-20 MED ORDER — LABETALOL HCL 5 MG/ML IV SOLN: 10.0000 mg | INTRAVENOUS | Status: AC | PRN

## 2016-09-20 MED ORDER — VERAPAMIL HCL 2.5 MG/ML IV SOLN
INTRAVENOUS | Status: AC
  Filled 2016-09-20: qty 2

## 2016-09-20 MED ORDER — SODIUM CHLORIDE 0.9 % WEIGHT BASED INFUSION: 1.0000 mL/kg/h | INTRAVENOUS | Status: DC

## 2016-09-20 MED ORDER — HEPARIN SODIUM (PORCINE) 1000 UNIT/ML IJ SOLN
INTRAMUSCULAR | Status: AC
  Filled 2016-09-20: qty 1

## 2016-09-20 MED ORDER — HYDRALAZINE HCL 20 MG/ML IJ SOLN: 5.0000 mg | INTRAMUSCULAR | Status: AC | PRN

## 2016-09-20 MED ORDER — HEPARIN (PORCINE) IN NACL 2-0.9 UNIT/ML-% IJ SOLN
INTRAMUSCULAR | Status: AC
Start: 2016-09-20 — End: ?
  Filled 2016-09-20: qty 500

## 2016-09-20 MED ORDER — CLOPIDOGREL BISULFATE 75 MG PO TABS: 75.0000 mg | ORAL_TABLET | Freq: Every day | ORAL | 3 refills | Status: DC

## 2016-09-20 MED ORDER — AMLODIPINE BESYLATE 5 MG PO TABS
10.0000 mg | ORAL_TABLET | Freq: Every day | ORAL | Status: DC
  Administered 2016-09-20: 10 mg via ORAL
  Filled 2016-09-20: qty 2

## 2016-09-20 MED ORDER — ANGIOPLASTY BOOK
Freq: Once | Status: DC
  Filled 2016-09-20 (×2): qty 1

## 2016-09-20 MED ORDER — SODIUM CHLORIDE 0.9 % IV SOLN: 250.0000 mL | INTRAVENOUS | Status: DC | PRN

## 2016-09-20 MED ORDER — SODIUM CHLORIDE 0.9 % IV SOLN: INTRAVENOUS | Status: AC

## 2016-09-20 MED ORDER — NITROGLYCERIN 0.4 MG SL SUBL: 0.4000 mg | SUBLINGUAL_TABLET | SUBLINGUAL | 3 refills | Status: DC | PRN

## 2016-09-20 MED ORDER — MOMETASONE FURO-FORMOTEROL FUM 200-5 MCG/ACT IN AERO: 2.0000 | INHALATION_SPRAY | Freq: Two times a day (BID) | RESPIRATORY_TRACT | Status: DC

## 2016-09-20 MED ORDER — FAMOTIDINE IN NACL 20-0.9 MG/50ML-% IV SOLN
INTRAVENOUS | Status: AC | PRN
  Administered 2016-09-20: 20 mg via INTRAVENOUS

## 2016-09-20 MED ORDER — FENTANYL CITRATE (PF) 100 MCG/2ML IJ SOLN
INTRAMUSCULAR | Status: AC
  Filled 2016-09-20: qty 2

## 2016-09-20 MED ORDER — FENTANYL CITRATE (PF) 100 MCG/2ML IJ SOLN
INTRAMUSCULAR | Status: DC | PRN
  Administered 2016-09-20: 50 ug via INTRAVENOUS

## 2016-09-20 MED ORDER — ASPIRIN 81 MG PO CHEW
81.0000 mg | CHEWABLE_TABLET | ORAL | Status: AC
  Administered 2016-09-20: 81 mg via ORAL

## 2016-09-20 MED ORDER — CLOPIDOGREL BISULFATE 300 MG PO TABS
ORAL_TABLET | ORAL | Status: DC | PRN
  Administered 2016-09-20: 600 mg via ORAL

## 2016-09-20 MED ORDER — NITROGLYCERIN 1 MG/10 ML FOR IR/CATH LAB
INTRA_ARTERIAL | Status: AC
  Filled 2016-09-20: qty 10

## 2016-09-20 MED ORDER — PANTOPRAZOLE SODIUM 40 MG PO TBEC: 40.0000 mg | DELAYED_RELEASE_TABLET | Freq: Every day | ORAL | 1 refills | Status: DC

## 2016-09-20 MED ORDER — MIDAZOLAM HCL 2 MG/2ML IJ SOLN
INTRAMUSCULAR | Status: DC | PRN
  Administered 2016-09-20: 2 mg via INTRAVENOUS

## 2016-09-20 MED ORDER — CLOPIDOGREL BISULFATE 300 MG PO TABS
ORAL_TABLET | ORAL | Status: AC
  Filled 2016-09-20: qty 1

## 2016-09-20 MED ORDER — SODIUM CHLORIDE 0.9% FLUSH: 3.0000 mL | INTRAVENOUS | Status: DC | PRN

## 2016-09-20 MED ORDER — HYDRALAZINE HCL 20 MG/ML IJ SOLN
INTRAMUSCULAR | Status: DC | PRN
  Administered 2016-09-20 (×2): 10 mg via INTRAVENOUS

## 2016-09-20 MED ORDER — ONDANSETRON HCL 4 MG/2ML IJ SOLN
INTRAMUSCULAR | Status: AC
  Administered 2016-09-20: 4 mg via INTRAVENOUS
  Filled 2016-09-20: qty 2

## 2016-09-20 MED ORDER — ASPIRIN 81 MG PO CHEW
CHEWABLE_TABLET | ORAL | Status: AC
  Administered 2016-09-20: 81 mg via ORAL
  Filled 2016-09-20: qty 1

## 2016-09-20 MED ORDER — FAMOTIDINE IN NACL 20-0.9 MG/50ML-% IV SOLN
INTRAVENOUS | Status: AC
  Filled 2016-09-20: qty 50

## 2016-09-20 MED ORDER — HYDRALAZINE HCL 20 MG/ML IJ SOLN
INTRAMUSCULAR | Status: AC
  Filled 2016-09-20: qty 1

## 2016-09-20 MED ORDER — MIDAZOLAM HCL 2 MG/2ML IJ SOLN
INTRAMUSCULAR | Status: AC
  Filled 2016-09-20: qty 2

## 2016-09-20 MED ORDER — ADENOSINE 12 MG/4ML IV SOLN
INTRAVENOUS | Status: AC
  Filled 2016-09-20: qty 16

## 2016-09-20 MED ORDER — ONDANSETRON HCL 4 MG/2ML IJ SOLN
4.0000 mg | Freq: Four times a day (QID) | INTRAMUSCULAR | Status: DC | PRN
  Administered 2016-09-20: 4 mg via INTRAVENOUS

## 2016-09-20 MED ORDER — ASPIRIN EC 81 MG PO TBEC: 81.0000 mg | DELAYED_RELEASE_TABLET | Freq: Every day | ORAL | 0 refills | Status: DC

## 2016-09-20 MED ORDER — NITROGLYCERIN 1 MG/10 ML FOR IR/CATH LAB
INTRA_ARTERIAL | Status: DC | PRN
  Administered 2016-09-20: 200 ug via INTRACORONARY
  Administered 2016-09-20: 200 ug

## 2016-09-20 MED ORDER — IOPAMIDOL (ISOVUE-370) INJECTION 76%
INTRAVENOUS | Status: AC
  Filled 2016-09-20: qty 100

## 2016-09-20 MED FILL — CLOPIDOGREL 75 MG TABLET: 75 | 30 days supply | Qty: 30 | Fill #0

## 2016-09-20 MED FILL — ASPIRIN EC 81 MG TABLET: 81 | 30 days supply | Qty: 30 | Fill #0

## 2016-09-20 SURGICAL SUPPLY — 20 items
BALLN EMERGE MR 2.0X8 (BALLOONS) ×2
BALLN WOLVERINE 2.00X10 (BALLOONS) ×2
BALLOON EMERGE MR 2.0X8 (BALLOONS) IMPLANT
BALLOON WOLVERINE 2.00X10 (BALLOONS) IMPLANT
CATH OPTITORQUE TIG 4.0 5F (CATHETERS) ×1 IMPLANT
CATH VISTA GUIDE 6FR XB3.5 (CATHETERS) ×1 IMPLANT
DEVICE RAD COMP TR BAND LRG (VASCULAR PRODUCTS) ×1 IMPLANT
GLIDESHEATH SLEND A-KIT 6F 22G (SHEATH) ×1 IMPLANT
GUIDEWIRE INQWIRE 1.5J.035X260 (WIRE) IMPLANT
GUIDEWIRE PRESSURE COMET II (WIRE) ×1 IMPLANT
INQWIRE 1.5J .035X260CM (WIRE) ×2
KIT ENCORE 26 ADVANTAGE (KITS) ×1 IMPLANT
KIT HEART LEFT (KITS) ×2 IMPLANT
PACK CARDIAC CATHETERIZATION (CUSTOM PROCEDURE TRAY) ×2 IMPLANT
STENT SYNERGY DES 2.25X12 (Permanent Stent) ×1 IMPLANT
TRANSDUCER W/STOPCOCK (MISCELLANEOUS) ×2 IMPLANT
TUBING CIL FLEX 10 FLL-RA (TUBING) ×2 IMPLANT
VALVE GUARDIAN II ~~LOC~~ HEMO (MISCELLANEOUS) ×1 IMPLANT
WIRE ASAHI PROWATER 180CM (WIRE) ×1 IMPLANT
WIRE HI TORQ BMW 190CM (WIRE) ×1 IMPLANT

## 2016-09-20 NOTE — Progress Notes (Signed)
Nausea and vomiting of 300cc partiallyl digested food and c/o neck pain started 10/10 and now states it is 4/10 and Dr Ellyn Hack notified

## 2016-09-20 NOTE — H&P (View-Only) (Signed)
Cardiology Office Note:    Date:  09/19/2016   ID:  Brian Hudson, DOB 10-07-1950, MRN [id number]  PCP:  Josetta Huddle, MD  Cardiologist:  Candee Furbish, MD    Referring MD: Josetta Huddle, MD     History of Present Illness:    Brian Hudson is a 66 y.o. male with diabetes who underwent nuclear stress test at the request of Brian Kicks, NP that demonstrated a markedly abnormal exercise treadmill. Marked ST segment depression, 2 mm diffuse. AVR elevation. His nuclear imaging was normal however. EF 53%. No perfusion defects. No transient ischemic dilatation.  The stress test was performed because of coronary calcification seen on CT scan. [name] also had seen him and noted that he did not desaturate. He was concerned about possible chronotropic incompetence. His heart rate at baseline was in the 40s.  His father had heart attack at age 50. Coronary calcifications are noted in the LAD and circumflex arteries.  I had seen him back in the cardiology several years ago.  Past Medical History:  Diagnosis Date  . Anxiety   . Concussion   . DM (diabetes mellitus) ([place])    type 2  . ED (erectile dysfunction)   . Fracture 2010   L 3 and L 4 healed with brace  . GERD (gastroesophageal reflux disease)   . Hyperlipidemia   . IBS (irritable bowel syndrome)   . Palpitations    none in last few yrs  . Prostate cancer ([place]) 2007  . Sigmoid diverticulosis   . Wegener's granulomatosis ([place])    followed by dr Brian Hudson    Past Surgical History:  Procedure Laterality Date  . COLONOSCOPY N/A 06/24/2016   Procedure: COLONOSCOPY with propofol;  Surgeon: Brian Fair, MD;  Location: [facility];  Service: Endoscopy;  Laterality: N/A;  . colonscopy  2008  . INSERTION PROSTATE RADIATION SEED  2007  . ROTATOR CUFF REPAIR Right     Current Medications: Current Meds  Medication Sig  . acetaminophen (TYLENOL) 325 MG tablet Take 650 mg by mouth as needed for mild pain or moderate pain.   Marland Kitchen amLODipine (NORVASC) 10 MG tablet Take 1 tablet (10 mg total) by mouth daily.  . budesonide-formoterol (SYMBICORT) 160-4.5 MCG/ACT inhaler Inhale 2 puffs into the lungs 2 (two) times daily.  Marland Kitchen escitalopram (LEXAPRO) 10 MG tablet Take 10 mg by mouth daily.  Marland Kitchen glipiZIDE (GLUCOTROL XL) 5 MG 24 hr tablet Take 5 mg by mouth 2 (two) times daily.  . hydrocortisone cream 1 % Apply 1 application topically daily as needed for itching.  . metFORMIN (GLUCOPHAGE-XR) 500 MG 24 hr tablet Take 500 mg by mouth daily with supper.  . Multiple Vitamin (MULTIVITAMIN WITH MINERALS) TABS tablet Take 1 tablet by mouth daily.  Marland Kitchen omeprazole (PRILOSEC) 20 MG capsule Take 20 mg by mouth daily.  . sodium chloride (OCEAN) 0.65 % SOLN nasal spray Place 1 spray into both nostrils as needed for congestion.     Allergies:   Codeine; Lipitor [atorvastatin]; Zetia [ezetimibe]; and Zocor [simvastatin]   Social History   Social History  . Marital status: Divorced    Spouse name: N/A  . Number of children: N/A  . Years of education: N/A   Occupational History  . Contractor    Social History Main Topics  . Smoking status: Former Smoker    Packs/day: 3.00    Years: 35.00    Types: Cigarettes    Quit date: 04/09/1991  . Smokeless  tobacco: Never Used  . Alcohol use No  . Drug use: Yes    Types: Marijuana     Comment:   marijuana last used 2- weeks ago as of 06-20-16  . Sexual activity: Not Asked   Other Topics Concern  . None   Social History Narrative  . None     Family History: The patient's family history includes CAD in his father; Cancer in his brother; Diabetic kidney disease in his mother; Healthy in his sister and sister; Heart attack (age of onset: 59) in his father. ROS:   Please see the history of present illness.     All other systems reviewed and are negative.  EKGs/Labs/Other Studies Reviewed:    The following studies were reviewed today: Nuclear stress test as described above. Markedly  abnormal ETT with ST segment depression. Perfusion images are normal.  EKG:  EKG is not ordered today. Baseline ECG from exercise treadmill test shows sinus rhythm heart rate 49. Personally viewed.  Recent Labs: 09/06/2016: ALT 20; BUN 15; Creatinine, Ser 1.26; Potassium 5.2; Sodium 138; TSH 0.873   Recent Lipid Panel    Component Value Date/Time   CHOL 185 09/06/2016 0912   TRIG 89 09/06/2016 0912   HDL 33 (L) 09/06/2016 0912   CHOLHDL 5.6 (H) 09/06/2016 0912   LDLCALC 134 (H) 09/06/2016 0912    Physical Exam:    VS:  BP 112/62   Pulse (!) 58   Ht 5\' 9"  (1.753 m)   Wt 172 lb 9.6 oz (78.3 kg)   BMI 25.49 kg/m     Wt Readings from Last 3 Encounters:  09/19/16 172 lb 9.6 oz (78.3 kg)  09/19/16 173 lb (78.5 kg)  09/11/16 172 lb 6.4 oz (78.2 kg)     GEN:  Well nourished, well developed in no acute distress HEENT: Normal NECK: No JVD; No carotid bruits LYMPHATICS: No lymphadenopathy CARDIAC: RRR, no murmurs, rubs, gallops RESPIRATORY:  Clear to auscultation without rales, wheezing or rhonchi  ABDOMEN: Soft, non-tender, non-distended MUSCULOSKELETAL:  No edema; No deformity  SKIN: Warm and dry NEUROLOGIC:  Alert and oriented x 3 PSYCHIATRIC:  Normal affect   ASSESSMENT:    1. Abnormal cardiovascular stress test   2. Pre-operative exam   3. Coronary artery calcification   4. Mixed hyperlipidemia    PLAN:    In order of problems listed above:  Markedly abnormal exercise treadmill test/stress test  - With LAD and circumflex calcification, concerning for multivessel coronary artery disease with marketed 2 mm ST segment depression diffusely with aVR elevation. He was significant short of breath, turned pale. His blood pressure did elevate appropriately. He exercised for a total of 9 minutes 46 seconds but had to be held up at the end.  - His perfusion images are normal and there is no dilatation and EF is 53%. With his symptoms and treadmill test however I do believe that  he should proceed with cardiac catheterization.  Coronary artery calcification  - LAD and circumflex. Getting cardiac catheterization as above. Risks and benefits of procedure including stroke, heart attack, death, renal impairment explained.  Diabetes  - Holding metformin prior to cardiac catheterization.  He had normal chronotropic competence airing exercise treadmill test.  Medication Adjustments/Labs and Tests Ordered: Current medicines are reviewed at length with the patient today.  Concerns regarding medicines are outlined above. Labs and tests ordered and medication changes are outlined in the patient instructions below:  Patient Instructions  Medication Instructions:  The current  medical regimen is effective;  continue present plan and medications.  Labwork: Please have blood work today  (BMP, CBC and PT/INR)  Testing/Procedures: Your physician has requested that you have a cardiac catheterization. Cardiac catheterization is used to diagnose and/or treat various heart conditions. Doctors may recommend this procedure for a number of different reasons. The most common reason is to evaluate chest pain. Chest pain can be a symptom of coronary artery disease (CAD), and cardiac catheterization can show whether plaque is narrowing or blocking your heart's arteries. This procedure is also used to evaluate the valves, as well as measure the blood flow and oxygen levels in different parts of your heart. For further information please visit [email or web address]. Please follow instruction sheet, as given.  Follow-Up: Follow up after cardiac cath as instructed.  If you need a refill on your cardiac medications before your next appointment, please call your pharmacy.  Thank you for choosing [facility]!!      [facility] [address], [address] [place] [address] Dept: [phone] Loc:  [place]  09/19/2016  You are scheduled for a cardiac cath on Friday, June 15  with Dr. Ellyn Hack.  1. Please arrive at the [facility] (Main Entrance A) at [facility]: [address] [place], [place] [address] at 5:30 AM (two hours before your procedure to ensure your preparation). Free valet parking service is available.   Special note: Every effort is made to have your procedure done on time. Please understand that emergencies sometimes delay scheduled procedures.  2. Diet: nothing to eat or drink after midnight.  3. Labs: please have blood work today.  4. Medication instructions in preparation for your procedure: Do not take Metformin 24 hours before and 48 hours after your cath. Do not take Glipizide the morning of. You may take your other medications with sips of water.  5. Plan for one night stay--bring personal belongings. 6. Bring a current list of your medications and current insurance cards. 7. You MUST have a responsible person to drive you home. 8. Someone MUST be with you the first 24 hours after you arrive home or your discharge will be delayed. 9. Please wear clothes that are easy to get on and off and wear slip-on shoes.  Thank you for allowing [place] to care for you!   -- [facility] Invasive Cardiovascular services     Signed, Candee Furbish, MD  09/19/2016 2:19 PM    [place]

## 2016-09-20 NOTE — Progress Notes (Signed)
Client with shaking chills and c/o headache; PA to see

## 2016-09-20 NOTE — Progress Notes (Signed)
Dr Ellyn Hack in; client without c/o nausea or pain at present

## 2016-09-20 NOTE — Discharge Summary (Signed)
Discharge Summary    Patient ID: Brian Hudson,  MRN: [id number], DOB/AGE: 07/03/1950 66 y.o.  Admit date: 09/20/2016 Discharge date: 09/20/2016  Primary Care Provider: Josetta Huddle Primary Cardiologist: Dr. Marlou Porch    Discharge Diagnoses    Principal Problem:   Abnormal electrocardiogram during exercise stress test Active Problems:   Coronary artery calcification seen on CAT scan   History of Wegener's granulomatosis   Dyspnea on exertion   CAD (coronary artery disease)   Prostate cancer (HCC)   Hyperlipidemia   HTN (hypertension)   DM (diabetes mellitus) ([place])   Allergies Allergies  Allergen Reactions  . Codeine Other (See Comments)    "jittery"  . Lipitor [Atorvastatin] Other (See Comments)    arthralgia  . Zetia [Ezetimibe] Other (See Comments)    arthralgia  . Zocor [Simvastatin] Other (See Comments)    arthralgia     History of Present Illness     Brian Hudson is a 66 y.o. male with a history of Wegners granulomatosis, palpitations, COPD, DMT2, HTN, HLD (intolerant to statins), sinus bradycardia and coronary artery calcifcations who presented to [facility] today for planned outpatient cardiac catheterization.  He has a history of dyspnea on exertion that limits his activity. He was seen by [name] NP in the office on 09/06/16 for evaluation. A exercise Myoview was ordered as well as a 30 day event monitor even asymptomatic sinus bradycardia with heart rates in the 40s. His beta blocker was discontinued.  He underwent exercise nuclear stress test on 09/19/16 which was markedly abnormal with horizontal ST segment depression in inferolateral leads, EF 45-54%. However, the nuclear perfusion images were normal. He was added on to Dr. Kingsley Plan office schedule that day. Given his family history of CAD and coronary calcifications and CT scan he was set up for outpatient cardiac cath the following day.   Hospital Course     Consultants: none  Today he  presented for planned cardiac catheterization with Dr. Ellyn Hack. This showed 45% OM, 99% ostial ramus s/p DES, 80% proximal LCx s/p DES, 80% more distal LCx on AV groove portion of the circumflex after OM1 s/p cutting balloon and PCTA. He was start on Aspirin and Plavix that will be continued for at least 6 months. A Synergy stent was used in order to potentially stop Plavix earlier if necessary for Wegener's-related bleeding. He has a history of intolerance to statins and baseline bradycardia precludes the use of a beta blocker. He will hold Metformin for at least 48 hours post cath. Will change plavix to protonix given plavix. He will be discharged home today in good condition.    The patient has had an uncomplicated hospital course and is recovering well. The radial catheter site is stable. He has been seen by Dr. Ellyn Hack today and deemed ready for discharge home. All follow-up appointments have been scheduled. Discharge medications are listed below.  _____________  Discharge Vitals Blood pressure 125/69, pulse 68, temperature 97.6 F (36.4 C), temperature source Oral, resp. rate 14, height 5\' 9"  (1.753 m), weight 172 lb 9.6 oz (78.3 kg), SpO2 100 %.  Filed Weights   09/20/16 0559  Weight: 172 lb 9.6 oz (78.3 kg)    Labs & Radiologic Studies     CBC  Recent Labs  09/19/16 1350  WBC 9.3  HGB 13.9  HCT 39.6  MCV 84  PLT 793*   Basic Metabolic Panel  Recent Labs  09/19/16 1350  NA 135  K 4.4  CL 104  CO2 25  GLUCOSE 153*  BUN 13  CREATININE 1.14  CALCIUM 11.1*   Liver Function Tests No results for input(s): AST, ALT, ALKPHOS, BILITOT, PROT, ALBUMIN in the last 72 hours. No results for input(s): LIPASE, AMYLASE in the last 72 hours. Cardiac Enzymes No results for input(s): CKTOTAL, CKMB, CKMBINDEX, TROPONINI in the last 72 hours. BNP Invalid input(s): POCBNP D-Dimer No results for input(s): DDIMER in the last 72 hours. Hemoglobin A1C No results for input(s): HGBA1C  in the last 72 hours. Fasting Lipid Panel No results for input(s): CHOL, HDL, LDLCALC, TRIG, CHOLHDL, LDLDIRECT in the last 72 hours. Thyroid Function Tests No results for input(s): TSH, T4TOTAL, T3FREE, THYROIDAB in the last 72 hours.  Invalid input(s): FREET3  No results found.   Diagnostic Studies/Procedures    CATH 09/20/16 Procedures  Coronary Stent Intervention  Intravascular Pressure Wire/FFR Study  Left Heart Cath and Coronary Angiography  Conclusion    LV end diastolic pressure is normal.  1st Mrg lesion, 45 %stenosed.  Ost Ramus lesion, 99 %stenosed. Vessel is less than 1.5 mm  Culprit lesion: Bifurcation Cx-OM1 (1,1,1) lesion  A STENT SYNERGY DES 2.25X12 drug eluting stent was successfully placed.  Prox Cx-1 lesion, 80 %stenosed involving Ost 1st Mrg lesion, 80 %stenosed. ([place] 1,1,1)  A STENT SYNERGY DES 2.25X12 drug eluting stent was successfully placed crossing from the main Cx into OM1. Postdilated to 2.5 mm  Post intervention, there is a 0% residual stenosis.  Prox Cx-2 lesion, 80 %stenosed in the follow on AV groove portion of the circumflex after OM1. The bifurcation lesion.  Post Cutting Balloon then post PCI of OM PTCA intervention, there is a 10% residual stenosis.   Successful bifurcation PCI/PTCA of the circumflex into OM1 and into the AV groove circumflex for PTCA. OM1 is a larger vessel than the follow on AV groove circumflex proximal therefore decided to stent into the OM and PTCA the ostium.  Mild to moderate disease elsewhere. Normal LVEDP.  Plan:  Patient will need to be on aspirin plus Plavix for at least 6 months. I used a Synergy stent in order to allow for potentially safe stopping of the Plavix if necessary for Wegener's-related bleeding.  Patient was transferred back to the short stay holding area for TR band removal. If stable after bedrest, he would be ready for discharge today as per same day discharge planning.  Defer  further management to Dr. Marlou Porch.  The patient is intolerant of statins, and is bradycardic at baseline precluding beta blocker use.     _____________    Disposition   Pt is being discharged home today in good condition.  Follow-up Plans & Appointments    Follow-up Information    Eileen Stanford, PA-C. Go on 09/25/2016.   Specialties:  Cardiology, Radiology Why:  @ 11am (please arrive at least 10 minutes ahead of time) Contact information: [place] [address] [phone]          Discharge Instructions    Amb Referral to Cardiac Rehabilitation    Complete by:  As directed    Diagnosis:   Coronary Stents PTCA        Discharge Medications     Medication List    STOP taking these medications   omeprazole 20 MG capsule Commonly known as:  PRILOSEC     TAKE these medications   acetaminophen 325 MG tablet Commonly known as:  TYLENOL Take 650 mg by mouth every  morning.   amLODipine 10 MG tablet Commonly known as:  NORVASC Take 1 tablet (10 mg total) by mouth daily.   aspirin EC 81 MG tablet Take 1 tablet (81 mg total) by mouth daily.   budesonide-formoterol 160-4.5 MCG/ACT inhaler Commonly known as:  SYMBICORT Inhale 2 puffs into the lungs 2 (two) times daily.   clopidogrel 75 MG tablet Commonly known as:  PLAVIX Take 1 tablet (75 mg total) by mouth daily.   escitalopram 10 MG tablet Commonly known as:  LEXAPRO Take 10 mg by mouth daily.   glipiZIDE 5 MG 24 hr tablet Commonly known as:  GLUCOTROL XL Take 5 mg by mouth 2 (two) times daily.   hydrocortisone cream 1 % Apply 1 application topically daily as needed for itching.   metFORMIN 500 MG 24 hr tablet Commonly known as:  GLUCOPHAGE-XR Take 500 mg by mouth daily with supper. Notes to patient:  Monday 6/18 AM   multivitamin with minerals Tabs tablet Take 1 tablet by mouth daily.   nitroGLYCERIN 0.4 MG SL tablet Commonly known as:  NITROSTAT Place 1  tablet (0.4 mg total) under the tongue every 5 (five) minutes as needed for chest pain.   pantoprazole 40 MG tablet Commonly known as:  PROTONIX Take 1 tablet (40 mg total) by mouth daily.   sodium chloride 0.65 % Soln nasal spray Commonly known as:  OCEAN Place 1 spray into both nostrils as needed for congestion.        Outstanding Labs/Studies   none  Duration of Discharge Encounter   Greater than 30 minutes including physician time.  Signed, Angelena Form PA-C 09/20/2016, 4:30 PM

## 2016-09-20 NOTE — Progress Notes (Signed)
Katie,PA in and Dr Ellyn Hack in; ok to d/c home

## 2016-09-20 NOTE — Discharge Instructions (Signed)
°                                               NO METFORMIN/GLUCOPHAGE FOR 2 DAYS   Radial Site Care Refer to this sheet in the next few weeks. These instructions provide you with information on caring for yourself after your procedure. Your caregiver may also give you more specific instructions. Your treatment has been planned according to current medical practices, but problems sometimes occur. Call your caregiver if you have any problems or questions after your procedure. HOME CARE INSTRUCTIONS  You may shower the day after the procedure.Remove the bandage (dressing) and gently wash the site with plain soap and water.Gently pat the site dry.   Do not apply powder or lotion to the site.   Do not submerge the affected site in water for 3 to 5 days.   Inspect the site at least twice daily.   Do not flex or bend the affected arm for 24 hours.   No lifting over 5 pounds (2.3 kg) for 5 days after your procedure.   Do not drive home if you are discharged the same day of the procedure. Have someone else drive you.   You may drive 24 hours after the procedure unless otherwise instructed by your caregiver.  What to expect:  Any bruising will usually fade within 1 to 2 weeks.   Blood that collects in the tissue (hematoma) may be painful to the touch. It should usually decrease in size and tenderness within 1 to 2 weeks.  SEEK IMMEDIATE MEDICAL CARE IF:  You have unusual pain at the radial site.   You have redness, warmth, swelling, or pain at the radial site.   You have drainage (other than a small amount of blood on the dressing).   You have chills.   You have a fever or persistent symptoms for more than 72 hours.   You have a fever and your symptoms suddenly get worse.   Your arm becomes pale, cool, tingly, or numb.   You have heavy bleeding from the site. Hold pressure on the site.

## 2016-09-20 NOTE — Progress Notes (Signed)
CARDIAC REHAB PHASE I   Pt s/p PCI, for same day discharge. Completed PCI/stent education with pt and family at bedside.  Reviewed risk factors, anti-platelet therapy, stent card, activity restrictions, ntg, exercise, heart healthy and diabtes diet handouts and phase 2 cardiac rehab. Pt verbalized understanding, receptive to education. Pt agrees to phase 2 cardiac rehab referral, will send to [facility] per pt request. Pt in stretcher, call bell within reach.   [place], RN, BSN 09/20/2016 2:33 PM

## 2016-09-20 NOTE — Progress Notes (Signed)
Dr Ellyn Hack in to see client, client c/o slight indigestion and Dr Ellyn Hack aware and no new orders noted

## 2016-09-20 NOTE — Interval H&P Note (Signed)
History and Physical Interval Note:  09/20/2016 7:45 AM  Brian Hudson  has presented today for surgery, with the diagnosis of abnormal TM portion of  nuc for DOE (with atypical angina Sx)  The various methods of treatment have been discussed with the patient and family. After consideration of risks, benefits and other options for treatment, the patient has consented to  Procedure(s): Left Heart Cath and Coronary Angiography (N/A) with possible PCI as a surgical intervention .  The patient's history has been reviewed, patient examined, no change in status, stable for surgery.  I have reviewed the patient's chart and labs.  Questions were answered to the patient's satisfaction.     Cath Lab Visit (complete for each Cath Lab visit)  Clinical Evaluation Leading to the Procedure:   ACS: No.  Non-ACS:    Anginal Classification: CCS III  Anti-ischemic medical therapy: Minimal Therapy (1 class of medications)  Non-Invasive Test Results: Equivocal test results - grossly abnormal EKG with non-ischemic imaging, coronary calcification on CT.  Prior CABG: No previous CABG   Glenetta Hew

## 2016-09-23 ENCOUNTER — Encounter (HOSPITAL_COMMUNITY): Payer: Self-pay | Admitting: Cardiology

## 2016-09-23 SURGERY — LEFT HEART CATH AND CORONARY ANGIOGRAPHY
Anesthesia: LOCAL

## 2016-09-24 ENCOUNTER — Telehealth (HOSPITAL_COMMUNITY): Payer: Self-pay

## 2016-09-24 NOTE — Telephone Encounter (Signed)
Patient insurance is active and benefits verified. Patient has [employer] - no co-payment, no deductible, out of pocket $6700/$742.56 has been met, no co-insurance, no pre-authorization and no limit on visit. Passport/reference [phone].

## 2016-09-24 NOTE — Progress Notes (Deleted)
Cardiology Office Note    Date:  09/24/2016   ID:  Brian Hudson, DOB [birth year], MRN [id number]  PCP:  Josetta Huddle, MD  Cardiologist: Dr. Marlou Porch   CC: post cath follow up.   History of Present Illness:  Brian Hudson is a 66 y.o. male with a history of Wegners granulomatosis, palpitations, COPD, DMT2, HTN, HLD (intolerant to statins), sinus bradycardia and recently diagnosed CAD who presents to clinic for follow up.   He has a history of dyspnea on exertion that limits his activity. He was seen by [name] NP in the office on 09/06/16 for evaluation. A exercise Myoview was ordered as well as a 30 day event monitor even asymptomatic sinus bradycardia with heart rates in the 40s. His beta blocker was discontinued.  He underwent exercise nuclear stress test on 09/19/16 which was markedly abnormal with horizontal ST segment depression in inferolateral leads, EF 45-54%. However, the nuclear perfusion images were normal. He was set up for LHC on 09/20/16 which showed 45% OM, 99% ostial ramus s/p DES, 80% proximal LCx s/p DES, 80% more distal LCx on AV groove portion of the circumflex after OM1 s/p cutting balloon and PCTA. He was start on Aspirin and Plavix that will be continued for at least 6 months. A Synergy stent was used in order to potentially stop Plavix earlier if necessary for Wegener's-related bleeding. He has a history of intolerance to statins and baseline bradycardia precludes the use of a beta blocker. Prilosec was changed to protonix given plavix.  Today he presents to clinic for follow up.     Past Medical History:  Diagnosis Date  . Anxiety   . CAD (coronary artery disease)    a. 09/20/16: 45% OM, 99% ostial ramus s/p DES, 80% proximal LCx s/p DES, 80% more distal LCx on AV groove portion of the circumflex after OM1 s/p cutting balloon and PCTA.   Marland Kitchen Concussion   . DM (diabetes mellitus) ([place])    type 2  . ED (erectile dysfunction)   . Fracture 2010   L 3 and L 4  healed with brace  . GERD (gastroesophageal reflux disease)   . HTN (hypertension)   . Hyperlipidemia   . IBS (irritable bowel syndrome)   . Palpitations    none in last few yrs  . Prostate cancer ([place]) 2007  . Sigmoid diverticulosis   . Wegener's granulomatosis ([place])    followed by dr Berna Bue    Past Surgical History:  Procedure Laterality Date  . COLONOSCOPY N/A 06/24/2016   Procedure: COLONOSCOPY with propofol;  Surgeon: Garlan Fair, MD;  Location: [facility];  Service: Endoscopy;  Laterality: N/A;  . colonscopy  2008  . CORONARY STENT INTERVENTION N/A 09/20/2016   Procedure: Coronary Stent Intervention;  Surgeon: Leonie Man, MD;  Location: [facility];  Service: Cardiovascular;  Laterality: N/A;  . INSERTION PROSTATE RADIATION SEED  2007  . INTRAVASCULAR PRESSURE WIRE/FFR STUDY N/A 09/20/2016   Procedure: Intravascular Pressure Wire/FFR Study;  Surgeon: Leonie Man, MD;  Location: [facility];  Service: Cardiovascular;  Laterality: N/A;  . LEFT HEART CATH AND CORONARY ANGIOGRAPHY N/A 09/20/2016   Procedure: Left Heart Cath and Coronary Angiography;  Surgeon: Leonie Man, MD;  Location: [facility];  Service: Cardiovascular;  Laterality: N/A;  . ROTATOR CUFF REPAIR Right     Current Medications: Outpatient Medications Prior to Visit  Medication Sig Dispense Refill  . acetaminophen (TYLENOL)  325 MG tablet Take 650 mg by mouth every morning.     Marland Kitchen amLODipine (NORVASC) 10 MG tablet Take 1 tablet (10 mg total) by mouth daily. 180 tablet 3  . aspirin EC 81 MG tablet Take 1 tablet (81 mg total) by mouth daily. 30 tablet 0  . budesonide-formoterol (SYMBICORT) 160-4.5 MCG/ACT inhaler Inhale 2 puffs into the lungs 2 (two) times daily. 1 Inhaler 5  . clopidogrel (PLAVIX) 75 MG tablet Take 1 tablet (75 mg total) by mouth daily. 90 tablet 3  . escitalopram (LEXAPRO) 10 MG tablet Take 10 mg by mouth daily.    Marland Kitchen glipiZIDE (GLUCOTROL XL) 5 MG 24  hr tablet Take 5 mg by mouth 2 (two) times daily.    . hydrocortisone cream 1 % Apply 1 application topically daily as needed for itching.    . metFORMIN (GLUCOPHAGE-XR) 500 MG 24 hr tablet Take 500 mg by mouth daily with supper.    . Multiple Vitamin (MULTIVITAMIN WITH MINERALS) TABS tablet Take 1 tablet by mouth daily.    . nitroGLYCERIN (NITROSTAT) 0.4 MG SL tablet Place 1 tablet (0.4 mg total) under the tongue every 5 (five) minutes as needed for chest pain. 28 tablet 3  . pantoprazole (PROTONIX) 40 MG tablet Take 1 tablet (40 mg total) by mouth daily. 30 tablet 1  . sodium chloride (OCEAN) 0.65 % SOLN nasal spray Place 1 spray into both nostrils as needed for congestion.     No facility-administered medications prior to visit.      Allergies:   Codeine; Lipitor [atorvastatin]; Zetia [ezetimibe]; and Zocor [simvastatin]   Social History   Social History  . Marital status: Divorced    Spouse name: N/A  . Number of children: N/A  . Years of education: N/A   Occupational History  . Contractor    Social History Main Topics  . Smoking status: Former Smoker    Packs/day: 3.00    Years: 35.00    Types: Cigarettes    Quit date: 04/09/1991  . Smokeless tobacco: Never Used  . Alcohol use No  . Drug use: Yes    Types: Marijuana     Comment:   marijuana last used 2- weeks ago as of 06-20-16  . Sexual activity: Not on file   Other Topics Concern  . Not on file   Social History Narrative  . No narrative on file     Family History:  The patient's family history includes CAD in his father; Cancer in his brother; Diabetic kidney disease in his mother; Healthy in his sister and sister; Heart attack (age of onset: 24) in his father.      *** ROS/PE    Wt Readings from Last 3 Encounters:  09/20/16 172 lb 9.6 oz (78.3 kg)  09/19/16 172 lb 9.6 oz (78.3 kg)  09/19/16 173 lb (78.5 kg)      Studies/Labs Reviewed:   EKG:  EKG is*** ordered today.  The ekg ordered today demonstrates  ***  Recent Labs: 09/06/2016: ALT 20; TSH 0.873 09/19/2016: BUN 13; Creatinine, Ser 1.14; Hemoglobin 13.9; Platelets 144; Potassium 4.4; Sodium 135   Lipid Panel    Component Value Date/Time   CHOL 185 09/06/2016 0912   TRIG 89 09/06/2016 0912   HDL 33 (L) 09/06/2016 0912   CHOLHDL 5.6 (H) 09/06/2016 0912   LDLCALC 134 (H) 09/06/2016 0912    Additional studies/ records that were reviewed today include:  CATH 09/20/16 Procedures  Coronary Stent Intervention  Intravascular Pressure  Wire/FFR Study  Left Heart Cath and Coronary Angiography  Conclusion    LV end diastolic pressure is normal.  1st Mrg lesion, 45 %stenosed.  Ost Ramus lesion, 99 %stenosed. Vessel is less than 1.5 mm  Culprit lesion: Bifurcation Cx-OM1 (1,1,1) lesion  A STENT SYNERGY DES 2.25X12 drug eluting stent was successfully placed.  Prox Cx-1 lesion, 80 %stenosed involving Ost 1st Mrg lesion, 80 %stenosed. ([place] 1,1,1)  A STENT SYNERGY DES 2.25X12 drug eluting stent was successfully placed crossing from the main Cx into OM1. Postdilated to 2.5 mm  Post intervention, there is a 0% residual stenosis.  Prox Cx-2 lesion, 80 %stenosed in the follow on AV groove portion of the circumflex after OM1. The bifurcation lesion.  Post Cutting Balloon then post PCI of OM PTCA intervention, there is a 10% residual stenosis.  Successful bifurcation PCI/PTCA of the circumflex into OM1 and into the AV groove circumflex for PTCA. OM1 is a larger vessel than the follow on AV groove circumflex proximal therefore decided to stent into the OM and PTCA the ostium.  Mild to moderate disease elsewhere. Normal LVEDP.  Plan:  Patient will need to be on aspirin plus Plavix for at least 6 months. I used a Synergy stent in order to allow for potentially safe stopping of the Plavix if necessary for Wegener's-related bleeding.  Patient was transferred back to the short stay holding area for TR band removal. If stable after  bedrest, he would be ready for discharge today as per same day discharge planning.  Defer further management to Dr. Marlou Porch. The patient is intolerant of statins, and is bradycardic at baseline precluding beta blocker use.      ASSESSMENT & PLAN:   CAD:  COPD:  DMT2:   HTN:  Sinus bradycardia:  Medication Adjustments/Labs and Tests Ordered: Current medicines are reviewed at length with the patient today.  Concerns regarding medicines are outlined above.  Medication changes, Labs and Tests ordered today are listed in the Patient Instructions below. There are no Patient Instructions on file for this visit.   Signed, Angelena Form, PA-C  09/24/2016 8:56 AM    [facility] [address], [place], [place]  [address] Phone: [phone]; Fax: [phone]

## 2016-09-25 ENCOUNTER — Ambulatory Visit: Payer: Medicare Other | Admitting: Physician Assistant

## 2016-09-25 ENCOUNTER — Encounter (INDEPENDENT_AMBULATORY_CARE_PROVIDER_SITE_OTHER): Payer: Self-pay

## 2016-09-25 ENCOUNTER — Encounter: Payer: Self-pay | Admitting: Physician Assistant

## 2016-09-25 ENCOUNTER — Ambulatory Visit (INDEPENDENT_AMBULATORY_CARE_PROVIDER_SITE_OTHER): Payer: Medicare Other | Admitting: Physician Assistant

## 2016-09-25 VITALS — BP 118/52 | HR 56 | Ht 69.0 in | Wt 171.0 lb

## 2016-09-25 DIAGNOSIS — E782 Mixed hyperlipidemia: Secondary | ICD-10-CM

## 2016-09-25 DIAGNOSIS — I10 Essential (primary) hypertension: Secondary | ICD-10-CM | POA: Diagnosis not present

## 2016-09-25 DIAGNOSIS — I25119 Atherosclerotic heart disease of native coronary artery with unspecified angina pectoris: Secondary | ICD-10-CM

## 2016-09-25 DIAGNOSIS — J449 Chronic obstructive pulmonary disease, unspecified: Secondary | ICD-10-CM | POA: Diagnosis not present

## 2016-09-25 DIAGNOSIS — Z955 Presence of coronary angioplasty implant and graft: Secondary | ICD-10-CM | POA: Diagnosis not present

## 2016-09-25 MED ORDER — ROSUVASTATIN CALCIUM 5 MG PO TABS: 5.0000 mg | ORAL_TABLET | Freq: Every day | ORAL | 0 refills | Status: DC

## 2016-09-25 NOTE — Progress Notes (Signed)
Cardiology Office Note    Date:  09/25/2016   ID:  Brian Hudson, DOB 01/29/1951, MRN [id number]  PCP:  Brian Huddle, MD  Cardiologist: Dr. Marlou Hudson   CC: post cath follow up.   History of Present Illness:  Brian Hudson is a 66 y.o. male with a history of Wegners granulomatosis, palpitations, COPD, DMT2, HTN, HLD (intolerant to statins), sinus bradycardia and recently diagnosed CAD who presents to clinic for follow up.   He has a history of dyspnea on exertion that limits his activity. He was seen by Brian Hudson NPin the office on 09/06/16 for evaluation. A exercise Myoview was ordered as well as a 30 day event monitor even asymptomatic sinus bradycardia with heart rates in the 40s. His beta blocker was discontinued.  He underwent exercise nuclear stress test on 09/19/16 which was markedly abnormal with horizontal ST segment depression in inferolateral leads, EF 45-54%. However, the nuclear perfusion images were normal. He was set up for LHC on 09/20/16 which showed 45% OM, 99% ostial ramus s/p DES, 80% proximal LCx s/p DES, 80% more distal LCx on AV groove portion of the circumflex after OM1 s/p cutting balloon and PCTA. He was start on Aspirin and Plavix that will be continued for at least 6 months. A Synergy stent was used in order to potentially stop Plavix earlier if necessary for Wegener's-related bleeding. He has a history of intolerance to statinsand baseline bradycardia precludes the use of a beta blocker. Prilosec was changed to protonix given plavix.  Today he presents to clinic for follow up. He has felt great since leaving the hospital. He has not been exercising at all. He has done some house work but plans to walk with a friend when he gets back from vaction. He has not had any chest pain or SOB, but he has not really exerted himself. Before his stent he had SOB with even talking.  No LE edema, orthopnea or PND. No dizziness or syncope. No blood in stool or urine. No  palpitations.     Past Medical History:  Diagnosis Date  . Anxiety   . CAD (coronary artery disease)    a. 09/20/16: 45% OM, 99% ostial ramus s/p DES, 80% proximal LCx s/p DES, 80% more distal LCx on AV groove portion of the circumflex after OM1 s/p cutting balloon and PCTA.   Marland Kitchen Concussion   . DM (diabetes mellitus) ([name])    type 2  . ED (erectile dysfunction)   . Fracture 2010   L 3 and L 4 healed with brace  . GERD (gastroesophageal reflux disease)   . HTN (hypertension)   . Hyperlipidemia   . IBS (irritable bowel syndrome)   . Palpitations    none in last few yrs  . Prostate cancer (Brian [facility]) 2007  . Sigmoid diverticulosis   . Wegener's granulomatosis (Brian Hudson)    followed by dr Brian Hudson    Past Surgical History:  Procedure Laterality Date  . COLONOSCOPY N/A 06/24/2016   Procedure: COLONOSCOPY with propofol;  Surgeon: Brian Fair, MD;  Location: [facility];  Service: Endoscopy;  Laterality: N/A;  . colonscopy  2008  . CORONARY STENT INTERVENTION N/A 09/20/2016   Procedure: Coronary Stent Intervention;  Surgeon: Brian Man, MD;  Location: [facility];  Service: Cardiovascular;  Laterality: N/A;  . INSERTION PROSTATE RADIATION SEED  2007  . INTRAVASCULAR PRESSURE WIRE/FFR STUDY N/A 09/20/2016   Procedure: Intravascular Pressure Wire/FFR Study;  Surgeon: Brian Man,  MD;  Location: [facility];  Service: Cardiovascular;  Laterality: N/A;  . LEFT HEART CATH AND CORONARY ANGIOGRAPHY N/A 09/20/2016   Procedure: Left Heart Cath and Coronary Angiography;  Surgeon: Brian Man, MD;  Location: [facility];  Service: Cardiovascular;  Laterality: N/A;  . ROTATOR CUFF REPAIR Right     Current Medications: Outpatient Medications Prior to Visit  Medication Sig Dispense Refill  . acetaminophen (TYLENOL) 325 MG tablet Take 650 mg by mouth every morning.     Marland Kitchen amLODipine (NORVASC) 10 MG tablet Take 1 tablet (10 mg total) by mouth daily. 180 tablet 3    . aspirin EC 81 MG tablet Take 1 tablet (81 mg total) by mouth daily. 30 tablet 0  . budesonide-formoterol (SYMBICORT) 160-4.5 MCG/ACT inhaler Inhale 2 puffs into the lungs 2 (two) times daily. 1 Inhaler 5  . clopidogrel (PLAVIX) 75 MG tablet Take 1 tablet (75 mg total) by mouth daily. 90 tablet 3  . escitalopram (LEXAPRO) 10 MG tablet Take 10 mg by mouth daily.    Marland Kitchen glipiZIDE (GLUCOTROL XL) 5 MG 24 hr tablet Take 5 mg by mouth 2 (two) times daily.    . hydrocortisone cream 1 % Apply 1 application topically daily as needed for itching.    . metFORMIN (GLUCOPHAGE-XR) 500 MG 24 hr tablet Take 500 mg by mouth daily with supper.    . Multiple Vitamin (MULTIVITAMIN WITH MINERALS) TABS tablet Take 1 tablet by mouth daily.    . nitroGLYCERIN (NITROSTAT) 0.4 MG SL tablet Place 1 tablet (0.4 mg total) under the tongue every 5 (five) minutes as needed for chest pain. 28 tablet 3  . sodium chloride (OCEAN) 0.65 % SOLN nasal spray Place 1 spray into both nostrils as needed for congestion.    . pantoprazole (PROTONIX) 40 MG tablet Take 1 tablet (40 mg total) by mouth daily. 30 tablet 1   No facility-administered medications prior to visit.      Allergies:   Codeine; Lipitor [atorvastatin]; Zetia [ezetimibe]; and Zocor [simvastatin]   Social History   Social History  . Marital status: Divorced    Spouse name: N/A  . Number of children: N/A  . Years of education: N/A   Occupational History  . Contractor    Social History Main Topics  . Smoking status: Former Smoker    Packs/day: 3.00    Years: 35.00    Types: Cigarettes    Quit date: 04/09/1991  . Smokeless tobacco: Never Used  . Alcohol use No  . Drug use: Yes    Types: Marijuana     Comment:   marijuana last used 2- weeks ago as of 06-20-16  . Sexual activity: Not Asked   Other Topics Concern  . None   Social History Narrative  . None     Family History:  The patient's family history includes CAD in his father; Cancer in his  brother; Diabetic kidney disease in his mother; Healthy in his sister and sister; Heart attack (age of onset: 43) in his father.       ROS:   Please see the history of present illness.    ROS All other systems reviewed and are negative.   PHYSICAL EXAM:   VS:  BP (!) 118/52   Pulse (!) 56   Ht 5\' 9"  (1.753 m)   Wt 171 lb (77.6 kg)   BMI 25.25 kg/m    GEN: Well nourished, well developed, in no acute distress  HEENT:  normal  Neck: no JVD, carotid bruits, or masses Cardiac: RRR; no murmurs, rubs, or gallops,no edema  Respiratory:  clear to auscultation bilaterally, normal work of breathing GI: soft, nontender, nondistended, + BS MS: no deformity or atrophy  Skin: warm and dry, no rash Neuro:  Alert and Oriented x 3, Strength and sensation are intact Psych: euthymic mood, full affec    Wt Readings from Last 3 Encounters:  09/25/16 171 lb (77.6 kg)  09/20/16 172 lb 9.6 oz (78.3 kg)  09/19/16 172 lb 9.6 oz (78.3 kg)      Studies/Labs Reviewed:   EKG:  EKG is ordered today.  The ekg ordered today demonstrates sinus bradycardia HR 56  Recent Labs: 09/06/2016: ALT 20; TSH 0.873 09/19/2016: BUN 13; Creatinine, Ser 1.14; Hemoglobin 13.9; Platelets 144; Potassium 4.4; Sodium 135   Lipid Panel    Component Value Date/Time   CHOL 185 09/06/2016 0912   TRIG 89 09/06/2016 0912   HDL 33 (L) 09/06/2016 0912   CHOLHDL 5.6 (H) 09/06/2016 0912   LDLCALC 134 (H) 09/06/2016 0912    Additional studies/ records that were reviewed today include:  CATH 09/20/16 Procedures  Coronary Stent Intervention  Intravascular Pressure Wire/FFR Study  Left Heart Cath and Coronary Angiography  Conclusion    LV end diastolic pressure is normal.  1st Mrg lesion, 45 %stenosed.  Ost Ramus lesion, 99 %stenosed. Vessel is less than 1.5 mm  Culprit lesion: Bifurcation Cx-OM1 (1,1,1) lesion  A STENT SYNERGY DES 2.25X12 drug eluting stent was successfully placed.  Prox Cx-1 lesion, 80 %stenosed  involving Ost 1st Mrg lesion, 80 %stenosed. ([place] 1,1,1)  A STENT SYNERGY DES 2.25X12 drug eluting stent was successfully placed crossing from the main Cx into OM1. Postdilated to 2.5 mm  Post intervention, there is a 0% residual stenosis.  Prox Cx-2 lesion, 80 %stenosed in the follow on AV groove portion of the circumflex after OM1. The bifurcation lesion.  Post Cutting Balloon then post PCI of OM PTCA intervention, there is a 10% residual stenosis.  Successful bifurcation PCI/PTCA of the circumflex into OM1 and into the AV groove circumflex for PTCA. OM1 is a larger vessel than the follow on AV groove circumflex proximal therefore decided to stent into the OM and PTCA the ostium.  Mild to moderate disease elsewhere. Normal LVEDP.  Plan:  Patient will need to be on aspirin plus Plavix for at least 6 months. I used a Synergy stent in order to allow for potentially safe stopping of the Plavix if necessary for Wegener's-related bleeding.  Patient was transferred back to the short stay holding area for TR band removal. If stable after bedrest, he would be ready for discharge today as per same day discharge planning.  Defer further management to Dr. Marlou Hudson. The patient is intolerant of statins, and is bradycardic at baseline precluding beta blocker use.      ASSESSMENT & PLAN:   CAD: stable. Continue ASA/plavix for at least 1 year after stent placement. No BB given baseline bradycardia and he is intolerant to statins. He is willing to try Crestor 5mg  daily.   COPD: stable.   DMT2: he says this is pretty well controlled. Continue follow up with PCP  HLD: LDL 134. Goal <70. He has not tolerated zocor, lipitor or Zetia in the past. He wil willing to try low dose Crestor 5mg  daily.   HTN: BP well controlled today   Sinus bradycardia: stable.    Medication Adjustments/Labs and Tests Ordered: Current medicines are  reviewed at length with the patient today.  Concerns  regarding medicines are outlined above.  Medication changes, Labs and Tests ordered today are listed in the Patient Instructions below. Patient Instructions  Medication Instructions:  Your physician recommends that you continue on your current medications as directed. Please refer to the Current Medication list given to you today.   Labwork: None ordered  Testing/Procedures: None ordered  Follow-Up: Your physician recommends that you schedule a follow-up appointment in:    Any Other Special Instructions Will Be Listed Below (If Applicable).    If you need a refill on your cardiac medications before your next appointment, please call your pharmacy.      Signed, Angelena Form, PA-C  09/25/2016 3:43 PM    [facility] [place], [place], [place]  [address] Phone: [phone]; Fax: [phone]

## 2016-09-25 NOTE — Patient Instructions (Addendum)
Medication Instructions:  Your physician has recommended you make the following change in your medication:  1.  START Crestor 5 mg taking 1 tablet daily   Labwork: None ordered  Testing/Procedures: None ordered  Follow-Up: Your physician recommends that you schedule a follow-up appointment in: 3 MONTHS WITH DR. Marlou Porch   Any Other Special Instructions Will Be Listed Below (If Applicable).    If you need a refill on your cardiac medications before your next appointment, please call your pharmacy.

## 2016-10-07 ENCOUNTER — Ambulatory Visit: Payer: Self-pay | Admitting: Cardiology

## 2016-10-15 ENCOUNTER — Encounter (HOSPITAL_COMMUNITY)
Admission: RE | Admit: 2016-10-15 | Discharge: 2016-10-15 | Disposition: A | Discharge status: Home / Self Care | Payer: Medicare Other | Source: Ambulatory Visit | Attending: Cardiology | Admitting: Cardiology

## 2016-10-15 VITALS — BP 108/60 | HR 50 | Ht 70.0 in | Wt 168.9 lb

## 2016-10-15 DIAGNOSIS — Z48812 Encounter for surgical aftercare following surgery on the circulatory system: Secondary | ICD-10-CM | POA: Insufficient documentation

## 2016-10-15 DIAGNOSIS — Z955 Presence of coronary angioplasty implant and graft: Secondary | ICD-10-CM | POA: Diagnosis present

## 2016-10-15 DIAGNOSIS — Z9861 Coronary angioplasty status: Secondary | ICD-10-CM | POA: Diagnosis not present

## 2016-10-15 NOTE — Progress Notes (Signed)
Cardiac Individual Treatment Plan  Patient Details  Name: Brian Hudson MRN: [id number] Date of Birth: 09-08-1950 Referring Provider:     CARDIAC REHAB PHASE II ORIENTATION from 10/15/2016 in [place]  Referring Provider  Candee Furbish MD      Initial Encounter Date:    CARDIAC REHAB PHASE II ORIENTATION from 10/15/2016 in [place]  Date  10/15/16  Referring Provider  Candee Furbish MD      Visit Diagnosis: 09/20/16 Stented coronary artery  Patient's Home Medications on Admission:  Current Outpatient Prescriptions:  .  acetaminophen (TYLENOL) 325 MG tablet, Take 650 mg by mouth every morning. , Disp: , Rfl:  .  amLODipine (NORVASC) 10 MG tablet, Take 1 tablet (10 mg total) by mouth daily., Disp: 180 tablet, Rfl: 3 .  aspirin EC 81 MG tablet, Take 1 tablet (81 mg total) by mouth daily., Disp: 30 tablet, Rfl: 0 .  budesonide-formoterol (SYMBICORT) 160-4.5 MCG/ACT inhaler, Inhale 2 puffs into the lungs 2 (two) times daily., Disp: 1 Inhaler, Rfl: 5 .  clopidogrel (PLAVIX) 75 MG tablet, Take 1 tablet (75 mg total) by mouth daily., Disp: 90 tablet, Rfl: 3 .  escitalopram (LEXAPRO) 10 MG tablet, Take 10 mg by mouth daily., Disp: , Rfl:  .  glipiZIDE (GLUCOTROL XL) 5 MG 24 hr tablet, Take 5 mg by mouth 2 (two) times daily., Disp: , Rfl:  .  hydrocortisone cream 1 %, Apply 1 application topically daily as needed for itching., Disp: , Rfl:  .  metFORMIN (GLUCOPHAGE-XR) 500 MG 24 hr tablet, Take 500 mg by mouth daily with supper., Disp: , Rfl:  .  Multiple Vitamin (MULTIVITAMIN WITH MINERALS) TABS tablet, Take 1 tablet by mouth daily., Disp: , Rfl:  .  nitroGLYCERIN (NITROSTAT) 0.4 MG SL tablet, Place 1 tablet (0.4 mg total) under the tongue every 5 (five) minutes as needed for chest pain., Disp: 28 tablet, Rfl: 3 .  pantoprazole (PROTONIX) 40 MG tablet, Take 40 mg by mouth daily., Disp: , Rfl:  .  rosuvastatin (CRESTOR) 5 MG  tablet, Take 1 tablet (5 mg total) by mouth daily., Disp: 30 tablet, Rfl: 0 .  sodium chloride (OCEAN) 0.65 % SOLN nasal spray, Place 1 spray into both nostrils as needed for congestion., Disp: , Rfl:  .  omeprazole (PRILOSEC) 20 MG capsule, Take 20 mg by mouth 2 (two) times daily before a meal. , Disp: , Rfl: 10  Past Medical History: Past Medical History:  Diagnosis Date  . Anxiety   . CAD (coronary artery disease)    a. 09/20/16: 45% OM, 99% ostial ramus s/p DES, 80% proximal LCx s/p DES, 80% more distal LCx on AV groove portion of the circumflex after OM1 s/p cutting balloon and PCTA.   Marland Kitchen Concussion   . DM (diabetes mellitus) ([place])    type 2  . ED (erectile dysfunction)   . Fracture 2010   L 3 and L 4 healed with brace  . GERD (gastroesophageal reflux disease)   . HTN (hypertension)   . Hyperlipidemia   . IBS (irritable bowel syndrome)   . Palpitations    none in last few yrs  . Prostate cancer ([place]) 2007  . Sigmoid diverticulosis   . Wegener's granulomatosis ([place])    followed by dr Berna Bue    Tobacco Use: History  Smoking Status  . Former Smoker  . Packs/day: 3.00  . Years: 35.00  . Types: Cigarettes  .  Quit date: 04/09/1991  Smokeless Tobacco  . Never Used    Labs: Recent Review Flowsheet Data    Labs for ITP Cardiac and Pulmonary Rehab Latest Ref Rng & Units 09/06/2016   Cholestrol 100 - 199 mg/dL 185   LDLCALC 0 - 99 mg/dL 134(H)   HDL >39 mg/dL 33(L)   Trlycerides 0 - 149 mg/dL 89      Capillary Blood Glucose: Lab Results  Component Value Date   GLUCAP 167 (H) 09/20/2016   GLUCAP 135 (H) 09/20/2016   GLUCAP 93 06/24/2016     Exercise Target Goals: Date: 10/15/16  Exercise Program Goal: Individual exercise prescription set with THRR, safety & activity barriers. Participant demonstrates ability to understand and report RPE using BORG scale, to self-measure pulse accurately, and to acknowledge the importance of the exercise  prescription.  Exercise Prescription Goal: Starting with aerobic activity 30 plus minutes a day, 3 days per week for initial exercise prescription. Provide home exercise prescription and guidelines that participant acknowledges understanding prior to discharge.  Activity Barriers & Risk Stratification:     Activity Barriers & Cardiac Risk Stratification - 10/15/16 0911      Activity Barriers & Cardiac Risk Stratification   Activity Barriers Deconditioning;Joint Problems;Back Problems;Neck/Spine Problems;Other (comment)   Comments R knee pain, LBP! and neck stiffness   Cardiac Risk Stratification High      6 Minute Walk:     6 Minute Walk    Row Name 10/15/16 1134         6 Minute Walk   Phase Initial     Distance 1266 feet     Walk Time 6 minutes     # of Rest Breaks 0     MPH 2.4     METS 2.88     RPE 7     VO2 Peak 10.1     Symptoms No     Resting HR 50 bpm     Resting BP 108/60     Max Ex. HR 65 bpm     Max Ex. BP 122/60     2 Minute Post BP 120/80        Oxygen Initial Assessment:   Oxygen Re-Evaluation:   Oxygen Discharge (Final Oxygen Re-Evaluation):   Initial Exercise Prescription:     Initial Exercise Prescription - 10/15/16 1100      Date of Initial Exercise RX and Referring Provider   Date 10/15/16   Referring Provider Candee Furbish MD     Bike   Level 0.5   Minutes 10   METs 2.26     NuStep   Level 3   SPM 70   Minutes 10   METs 2     Track   Laps 8   Minutes 10   METs 2.39     Prescription Details   Frequency (times per week) 3   Duration Progress to 30 minutes of continuous aerobic without signs/symptoms of physical distress     Intensity   THRR 40-80% of Max Heartrate 61-122   Ratings of Perceived Exertion 11-13   Perceived Dyspnea 0-4     Progression   Progression Continue to progress workloads to maintain intensity without signs/symptoms of physical distress.     Resistance Training   Training Prescription Yes    Weight 3lbs   Reps 10-15      Perform Capillary Blood Glucose checks as needed.  Exercise Prescription Changes:   Exercise Comments:   Exercise Goals and Review:  Exercise Goals    Row Name 10/15/16 0912             Exercise Goals   Increase Physical Activity Yes       Intervention Provide advice, education, support and counseling about physical activity/exercise needs.;Develop an individualized exercise prescription for aerobic and resistive training based on initial evaluation findings, risk stratification, comorbidities and participant's personal goals.       Expected Outcomes Achievement of increased cardiorespiratory fitness and enhanced flexibility, muscular endurance and strength shown through measurements of functional capacity and personal statement of participant.       Increase Strength and Stamina Yes  learn exercise limitations , return to normal activities outside the home       Intervention Provide advice, education, support and counseling about physical activity/exercise needs.;Develop an individualized exercise prescription for aerobic and resistive training based on initial evaluation findings, risk stratification, comorbidities and participant's personal goals.       Expected Outcomes Achievement of increased cardiorespiratory fitness and enhanced flexibility, muscular endurance and strength shown through measurements of functional capacity and personal statement of participant.          Exercise Goals Re-Evaluation :    Discharge Exercise Prescription (Final Exercise Prescription Changes):   Nutrition:  Target Goals: Understanding of nutrition guidelines, daily intake of sodium 1500mg , cholesterol 200mg , calories 30% from fat and 7% or less from saturated fats, daily to have 5 or more servings of fruits and vegetables.  Biometrics:     Pre Biometrics - 10/15/16 1139      Pre Biometrics   Waist Circumference 36.5 inches   Hip Circumference  38.5 inches   Waist to Hip Ratio 0.95 %   Triceps Skinfold 11 mm   % Body Fat 23.2 %   Grip Strength 38 kg   Flexibility 11.5 in   Single Leg Stand 25 seconds       Nutrition Therapy Plan and Nutrition Goals:   Nutrition Discharge: Nutrition Scores:   Nutrition Goals Re-Evaluation:   Nutrition Goals Re-Evaluation:   Nutrition Goals Discharge (Final Nutrition Goals Re-Evaluation):   Psychosocial: Target Goals: Acknowledge presence or absence of significant depression and/or stress, maximize coping skills, provide positive support system. Participant is able to verbalize types and ability to use techniques and skills needed for reducing stress and depression.  Initial Review & Psychosocial Screening:     Initial Psych Review & Screening - 10/15/16 1037      Initial Review   Current issues with Current Anxiety/Panic  QOL scores indicate health related anxiety      Family Dynamics   Good Support System? Yes  sister   Comments QOL scores indicate health related anxiety, although during brief assessment pt does not verbalize this concern.  Full assessment will be performed at next scheduled appointment.      Barriers   Psychosocial barriers to participate in program There are no identifiable barriers or psychosocial needs.     Screening Interventions   Interventions Encouraged to exercise;Provide feedback about the scores to participant      Quality of Life Scores:     Quality of Life - 10/15/16 1038      Quality of Life Scores   Health/Function Pre 11 %   Socioeconomic Pre 11.38 %   Psych/Spiritual Pre 10.57 %   Family Pre 11.6 %   GLOBAL Pre 11.09 %      PHQ-9: Recent Review Flowsheet Data    There is no flowsheet data to display.  Interpretation of Total Score  Total Score Depression Severity:  1-4 = Minimal depression, 5-9 = Mild depression, 10-14 = Moderate depression, 15-19 = Moderately severe depression, 20-27 = Severe depression    Psychosocial Evaluation and Intervention:   Psychosocial Re-Evaluation:   Psychosocial Discharge (Final Psychosocial Re-Evaluation):   Vocational Rehabilitation: Provide vocational rehab assistance to qualifying candidates.   Vocational Rehab Evaluation & Intervention:     Vocational Rehab - 10/15/16 1037      Initial Vocational Rehab Evaluation & Intervention   Assessment shows need for Vocational Rehabilitation --  pt did not complete voc rehab assessment      Education: Education Goals: Education classes will be provided on a weekly basis, covering required topics. Participant will state understanding/return demonstration of topics presented.  Learning Barriers/Preferences:     Learning Barriers/Preferences - 10/15/16 0912      Learning Barriers/Preferences   Learning Barriers Sight;Hearing  need hearing aids   Learning Preferences Video;Skilled Demonstration;Pictoral      Education Topics: Count Your Pulse:  -Group instruction provided by verbal instruction, demonstration, patient participation and written materials to support subject.  Instructors address importance of being able to find your pulse and how to count your pulse when at home without a heart monitor.  Patients get hands on experience counting their pulse with staff help and individually.   Heart Attack, Angina, and Risk Factor Modification:  -Group instruction provided by verbal instruction, video, and written materials to support subject.  Instructors address signs and symptoms of angina and heart attacks.    Also discuss risk factors for heart disease and how to make changes to improve heart health risk factors.   Functional Fitness:  -Group instruction provided by verbal instruction, demonstration, patient participation, and written materials to support subject.  Instructors address safety measures for doing things around the house.  Discuss how to get up and down off the floor, how to pick  things up properly, how to safely get out of a chair without assistance, and balance training.   Meditation and Mindfulness:  -Group instruction provided by verbal instruction, patient participation, and written materials to support subject.  Instructor addresses importance of mindfulness and meditation practice to help reduce stress and improve awareness.  Instructor also leads participants through a meditation exercise.    Stretching for Flexibility and Mobility:  -Group instruction provided by verbal instruction, patient participation, and written materials to support subject.  Instructors lead participants through series of stretches that are designed to increase flexibility thus improving mobility.  These stretches are additional exercise for major muscle groups that are typically performed during regular warm up and cool down.   Hands Only CPR:  -Group verbal, video, and participation provides a basic overview of AHA guidelines for community CPR. Role-play of emergencies allow participants the opportunity to practice calling for help and chest compression technique with discussion of AED use.   Hypertension: -Group verbal and written instruction that provides a basic overview of hypertension including the most recent diagnostic guidelines, risk factor reduction with self-care instructions and medication management.    Nutrition I class: Heart Healthy Eating:  -Group instruction provided by PowerPoint slides, verbal discussion, and written materials to support subject matter. The instructor gives an explanation and review of the Therapeutic Lifestyle Changes diet recommendations, which includes a discussion on lipid goals, dietary fat, sodium, fiber, plant stanol/sterol esters, sugar, and the components of a well-balanced, healthy diet.   Nutrition II class: Lifestyle Skills:  -Group instruction provided by PowerPoint  slides, verbal discussion, and written materials to support subject  matter. The instructor gives an explanation and review of label reading, grocery shopping for heart health, heart healthy recipe modifications, and ways to make healthier choices when eating out.   Diabetes Question & Answer:  -Group instruction provided by PowerPoint slides, verbal discussion, and written materials to support subject matter. The instructor gives an explanation and review of diabetes co-morbidities, pre- and post-prandial blood glucose goals, pre-exercise blood glucose goals, signs, symptoms, and treatment of hypoglycemia and hyperglycemia, and foot care basics.   Diabetes Blitz:  -Group instruction provided by PowerPoint slides, verbal discussion, and written materials to support subject matter. The instructor gives an explanation and review of the physiology behind type 1 and type 2 diabetes, diabetes medications and rational behind using different medications, pre- and post-prandial blood glucose recommendations and Hemoglobin A1c goals, diabetes diet, and exercise including blood glucose guidelines for exercising safely.    Portion Distortion:  -Group instruction provided by PowerPoint slides, verbal discussion, written materials, and food models to support subject matter. The instructor gives an explanation of serving size versus portion size, changes in portions sizes over the last 20 years, and what consists of a serving from each food group.   Stress Management:  -Group instruction provided by verbal instruction, video, and written materials to support subject matter.  Instructors review role of stress in heart disease and how to cope with stress positively.     Exercising on Your Own:  -Group instruction provided by verbal instruction, power point, and written materials to support subject.  Instructors discuss benefits of exercise, components of exercise, frequency and intensity of exercise, and end points for exercise.  Also discuss use of nitroglycerin and activating  EMS.  Review options of places to exercise outside of rehab.  Review guidelines for sex with heart disease.   Cardiac Drugs I:  -Group instruction provided by verbal instruction and written materials to support subject.  Instructor reviews cardiac drug classes: antiplatelets, anticoagulants, beta blockers, and statins.  Instructor discusses reasons, side effects, and lifestyle considerations for each drug class.   Cardiac Drugs II:  -Group instruction provided by verbal instruction and written materials to support subject.  Instructor reviews cardiac drug classes: angiotensin converting enzyme inhibitors (ACE-I), angiotensin II receptor blockers (ARBs), nitrates, and calcium channel blockers.  Instructor discusses reasons, side effects, and lifestyle considerations for each drug class.   Anatomy and Physiology of the Circulatory System:  Group verbal and written instruction and models provide basic cardiac anatomy and physiology, with the coronary electrical and arterial systems. Review of: AMI, Angina, Valve disease, Heart Failure, Peripheral Artery Disease, Cardiac Arrhythmia, Pacemakers, and the ICD.   Other Education:  -Group or individual verbal, written, or video instructions that support the educational goals of the cardiac rehab program.   Knowledge Questionnaire Score:     Knowledge Questionnaire Score - 10/15/16 1133      Knowledge Questionnaire Score   Pre Score 23/28      Core Components/Risk Factors/Patient Goals at Admission:     Personal Goals and Risk Factors at Admission - 10/15/16 1150      Core Components/Risk Factors/Patient Goals on Admission   Hypertension Yes   Intervention Provide education on lifestyle modifcations including regular physical activity/exercise, weight management, moderate sodium restriction and increased consumption of fresh fruit, vegetables, and low fat dairy, alcohol moderation, and smoking cessation.;Monitor prescription use compliance.    Expected Outcomes Short Term: Continued assessment and intervention until BP is <  140/49mm HG in hypertensive participants. < 130/24mm HG in hypertensive participants with diabetes, heart failure or chronic kidney disease.;Long Term: Maintenance of blood pressure at goal levels.   Lipids Yes   Intervention Provide education and support for participant on nutrition & aerobic/resistive exercise along with prescribed medications to achieve LDL 70mg , HDL >40mg .   Expected Outcomes Short Term: Participant states understanding of desired cholesterol values and is compliant with medications prescribed. Participant is following exercise prescription and nutrition guidelines.;Long Term: Cholesterol controlled with medications as prescribed, with individualized exercise RX and with personalized nutrition plan. Value goals: LDL < 70mg , HDL > 40 mg.      Core Components/Risk Factors/Patient Goals Review:    Core Components/Risk Factors/Patient Goals at Discharge (Final Review):    ITP Comments:     ITP Comments    Row Name 10/15/16 0903           ITP Comments Dr. Fransico Him, Medical Director          Comments: Patient attended orientation from 0730 to [phone] to review rules and guidelines for program. Completed 6 minute walk test, Intitial ITP, and exercise prescription.  VSS. Telemetry-sinus bradycardia, lowest rate 46.  Strips faxed to Dr. Marlou Porch for review.  Asymptomatic.

## 2016-10-15 NOTE — Progress Notes (Signed)
Cardiac Rehab Medication Review by a Pharmacist  Does the patient  feel that his/her medications are working for him/her?  yes  Has the patient been experiencing any side effects to the medications prescribed?  no  Does the patient measure his/her own blood pressure or blood glucose at home?  no   Does the patient have any problems obtaining medications due to transportation or finances?   no  Understanding of regimen: good Understanding of indications: good Potential of compliance: good    Pharmacist comments: 79 YOM presenting to cardiac rehab, notes concern with change in omeprazole to pantoprazole and more frequent heartburn.  He also states that he does not currently check BP or glucose and needs a BP cuff and glucometer.  Pt states no other concern with his medications or questions at this time.   Bertis Ruddy, PharmD Pharmacy Resident Pager #: [phone] 10/15/2016 8:59 AM

## 2016-10-15 NOTE — Progress Notes (Signed)
Brian Hudson 66 y.o. male       Nutrition Screen & Note  1. 09/20/16 Stented coronary artery    Past Medical History:  Diagnosis Date  . Anxiety   . CAD (coronary artery disease)    a. 09/20/16: 45% OM, 99% ostial ramus s/p DES, 80% proximal LCx s/p DES, 80% more distal LCx on AV groove portion of the circumflex after OM1 s/p cutting balloon and PCTA.   Marland Kitchen Concussion   . DM (diabetes mellitus) ([place])    type 2  . ED (erectile dysfunction)   . Fracture 2010   L 3 and L 4 healed with brace  . GERD (gastroesophageal reflux disease)   . HTN (hypertension)   . Hyperlipidemia   . IBS (irritable bowel syndrome)   . Palpitations    none in last few yrs  . Prostate cancer ([place]) 2007  . Sigmoid diverticulosis   . Wegener's granulomatosis ([place])    followed by dr Berna Bue   Meds reviewed. Metformin, Glipizide noted  HT: Ht Readings from Last 1 Encounters:  10/15/16 5\' 10"  (1.778 m)    WT: Wt Readings from Last 3 Encounters:  10/15/16 168 lb 14 oz (76.6 kg)  09/25/16 171 lb (77.6 kg)  09/20/16 172 lb 9.6 oz (78.3 kg)     BMI 24.3   Current tobacco use? No  Labs:  Lipid Panel     Component Value Date/Time   CHOL 185 09/06/2016 0912   TRIG 89 09/06/2016 0912   HDL 33 (L) 09/06/2016 0912   CHOLHDL 5.6 (H) 09/06/2016 0912   LDLCALC 134 (H) 09/06/2016 0912    No results found for: HGBA1C CBG (last 3)  No results for input(s): GLUCAP in the last 72 hours.  Nutrition Note Spoke with pt. Nutrition plan and goals reviewed with pt. Pt is diabetic and working towards following the Therapeutic Lifestyle Changes diet. Pt eats out frequently. Per discussion, pt is struggling with making lifestyle changes stating "it's hard to change habits." Pt expressed understanding of the information reviewed. Pt aware of nutrition education classes offered and is unable to attend nutrition classes due to "I'm still working when I can."  Nutrition Diagnosis ? Food-and nutrition-related  knowledge deficit related to lack of exposure to information as related to diagnosis of: ? CVD ? DM  Nutrition Intervention ? Pt's individual nutrition plan reviewed with pt. ? Pt given handouts for: ? Nutrition I class ? Nutrition II class ? Diabetes Blitz Class   Nutrition Goal(s):  ? Pt to identify and limit food sources of saturated fat, trans fat, and sodium  Plan:  Pt to attend nutrition classes ? Nutrition I ? Nutrition II ? Portion Distortion ? Diabetes Blitz ? Diabetes Q & A Will provide client-centered nutrition education as part of interdisciplinary care.   Monitor and evaluate progress toward nutrition goal with team.  Derek Mound, M.Ed, RD, LDN, CDE 10/15/2016 1:06 PM

## 2016-10-17 ENCOUNTER — Telehealth (HOSPITAL_COMMUNITY): Payer: Self-pay | Admitting: *Deleted

## 2016-10-17 NOTE — Telephone Encounter (Signed)
Pt called to inform rehab that he would like to hold on starting the exercise program next week.  Pt received a letter in the mail dated 7/2 that a procedure he had on 6/15 was not covered. Pt wished to resolve this issue first before beginning any new medical charges.  Pt indicated that he would call and let the rehab department know when he could start the exercise program.  Assured pt that his insurance and benefits were verified prior to scheduling his appt. For cardiac rehab. Asked pt had he contacted his insurance provider. Pt indicated that he had not but had planned to do so in the morning.  Contact information given to pt to please call. Cherre Huger, BSN Cardiac and Training and development officer

## 2016-10-21 ENCOUNTER — Encounter (HOSPITAL_COMMUNITY): Payer: Medicare Other

## 2016-10-22 NOTE — Progress Notes (Signed)
Cardiac Individual Treatment Plan  Patient Details  Name: Brian Hudson MRN: [id number] Date of Birth: 01/08/1951 Referring Provider:     CARDIAC REHAB PHASE II ORIENTATION from 10/15/2016 in [facility]  Referring Provider  Candee Furbish MD      Initial Encounter Date:    CARDIAC REHAB PHASE II ORIENTATION from 10/15/2016 in [facility]  Date  10/15/16  Referring Provider  Candee Furbish MD      Visit Diagnosis: 09/20/16 Stented coronary artery  Patient's Home Medications on Admission:  Current Outpatient Prescriptions:  .  acetaminophen (TYLENOL) 325 MG tablet, Take 650 mg by mouth every morning. , Disp: , Rfl:  .  amLODipine (NORVASC) 10 MG tablet, Take 1 tablet (10 mg total) by mouth daily., Disp: 180 tablet, Rfl: 3 .  aspirin EC 81 MG tablet, Take 1 tablet (81 mg total) by mouth daily., Disp: 30 tablet, Rfl: 0 .  budesonide-formoterol (SYMBICORT) 160-4.5 MCG/ACT inhaler, Inhale 2 puffs into the lungs 2 (two) times daily., Disp: 1 Inhaler, Rfl: 5 .  clopidogrel (PLAVIX) 75 MG tablet, Take 1 tablet (75 mg total) by mouth daily., Disp: 90 tablet, Rfl: 3 .  escitalopram (LEXAPRO) 10 MG tablet, Take 10 mg by mouth daily., Disp: , Rfl:  .  glipiZIDE (GLUCOTROL XL) 5 MG 24 hr tablet, Take 5 mg by mouth 2 (two) times daily., Disp: , Rfl:  .  hydrocortisone cream 1 %, Apply 1 application topically daily as needed for itching., Disp: , Rfl:  .  metFORMIN (GLUCOPHAGE-XR) 500 MG 24 hr tablet, Take 500 mg by mouth daily with supper., Disp: , Rfl:  .  Multiple Vitamin (MULTIVITAMIN WITH MINERALS) TABS tablet, Take 1 tablet by mouth daily., Disp: , Rfl:  .  nitroGLYCERIN (NITROSTAT) 0.4 MG SL tablet, Place 1 tablet (0.4 mg total) under the tongue every 5 (five) minutes as needed for chest pain., Disp: 28 tablet, Rfl: 3 .  omeprazole (PRILOSEC) 20 MG capsule, Take 20 mg by mouth 2 (two) times daily before a meal. , Disp: , Rfl: 10 .   pantoprazole (PROTONIX) 40 MG tablet, Take 40 mg by mouth daily., Disp: , Rfl:  .  rosuvastatin (CRESTOR) 5 MG tablet, Take 1 tablet (5 mg total) by mouth daily., Disp: 30 tablet, Rfl: 0 .  sodium chloride (OCEAN) 0.65 % SOLN nasal spray, Place 1 spray into both nostrils as needed for congestion., Disp: , Rfl:   Past Medical History: Past Medical History:  Diagnosis Date  . Anxiety   . CAD (coronary artery disease)    a. 09/20/16: 45% OM, 99% ostial ramus s/p DES, 80% proximal LCx s/p DES, 80% more distal LCx on AV groove portion of the circumflex after OM1 s/p cutting balloon and PCTA.   Marland Kitchen Concussion   . DM (diabetes mellitus) ([place])    type 2  . ED (erectile dysfunction)   . Fracture 2010   L 3 and L 4 healed with brace  . GERD (gastroesophageal reflux disease)   . HTN (hypertension)   . Hyperlipidemia   . IBS (irritable bowel syndrome)   . Palpitations    none in last few yrs  . Prostate cancer ([place]) 2007  . Sigmoid diverticulosis   . Wegener's granulomatosis ([place])    followed by dr Berna Bue    Tobacco Use: History  Smoking Status  . Former Smoker  . Packs/day: 3.00  . Years: 35.00  . Types: Cigarettes  .  Quit date: 04/09/1991  Smokeless Tobacco  . Never Used    Labs: Recent Review Flowsheet Data    Labs for ITP Cardiac and Pulmonary Rehab Latest Ref Rng & Units 09/06/2016   Cholestrol 100 - 199 mg/dL 185   LDLCALC 0 - 99 mg/dL 134(H)   HDL >39 mg/dL 33(L)   Trlycerides 0 - 149 mg/dL 89      Capillary Blood Glucose: Lab Results  Component Value Date   GLUCAP 167 (H) 09/20/2016   GLUCAP 135 (H) 09/20/2016   GLUCAP 93 06/24/2016     Exercise Target Goals:    Exercise Program Goal: Individual exercise prescription set with THRR, safety & activity barriers. Participant demonstrates ability to understand and report RPE using BORG scale, to self-measure pulse accurately, and to acknowledge the importance of the exercise prescription.  Exercise Prescription  Goal: Starting with aerobic activity 30 plus minutes a day, 3 days per week for initial exercise prescription. Provide home exercise prescription and guidelines that participant acknowledges understanding prior to discharge.  Activity Barriers & Risk Stratification:     Activity Barriers & Cardiac Risk Stratification - 10/15/16 0911      Activity Barriers & Cardiac Risk Stratification   Activity Barriers Deconditioning;Joint Problems;Back Problems;Neck/Spine Problems;Other (comment)   Comments R knee pain, LBP! and neck stiffness   Cardiac Risk Stratification High      6 Minute Walk:     6 Minute Walk    Row Name 10/15/16 1134         6 Minute Walk   Phase Initial     Distance 1266 feet     Walk Time 6 minutes     # of Rest Breaks 0     MPH 2.4     METS 2.88     RPE 7     VO2 Peak 10.1     Symptoms No     Resting HR 50 bpm     Resting BP 108/60     Max Ex. HR 65 bpm     Max Ex. BP 122/60     2 Minute Post BP 120/80        Oxygen Initial Assessment:   Oxygen Re-Evaluation:   Oxygen Discharge (Final Oxygen Re-Evaluation):   Initial Exercise Prescription:     Initial Exercise Prescription - 10/15/16 1100      Date of Initial Exercise RX and Referring Provider   Date 10/15/16   Referring Provider Candee Furbish MD     Bike   Level 0.5   Minutes 10   METs 2.26     NuStep   Level 3   SPM 70   Minutes 10   METs 2     Track   Laps 8   Minutes 10   METs 2.39     Prescription Details   Frequency (times per week) 3   Duration Progress to 30 minutes of continuous aerobic without signs/symptoms of physical distress     Intensity   THRR 40-80% of Max Heartrate 61-122   Ratings of Perceived Exertion 11-13   Perceived Dyspnea 0-4     Progression   Progression Continue to progress workloads to maintain intensity without signs/symptoms of physical distress.     Resistance Training   Training Prescription Yes   Weight 3lbs   Reps 10-15       Perform Capillary Blood Glucose checks as needed.  Exercise Prescription Changes:   Exercise Comments:   Exercise Goals and Review:  Exercise Goals    Row Name 10/15/16 0912             Exercise Goals   Increase Physical Activity Yes       Intervention Provide advice, education, support and counseling about physical activity/exercise needs.;Develop an individualized exercise prescription for aerobic and resistive training based on initial evaluation findings, risk stratification, comorbidities and participant's personal goals.       Expected Outcomes Achievement of increased cardiorespiratory fitness and enhanced flexibility, muscular endurance and strength shown through measurements of functional capacity and personal statement of participant.       Increase Strength and Stamina Yes  learn exercise limitations , return to normal activities outside the home       Intervention Provide advice, education, support and counseling about physical activity/exercise needs.;Develop an individualized exercise prescription for aerobic and resistive training based on initial evaluation findings, risk stratification, comorbidities and participant's personal goals.       Expected Outcomes Achievement of increased cardiorespiratory fitness and enhanced flexibility, muscular endurance and strength shown through measurements of functional capacity and personal statement of participant.          Exercise Goals Re-Evaluation :    Discharge Exercise Prescription (Final Exercise Prescription Changes):   Nutrition:  Target Goals: Understanding of nutrition guidelines, daily intake of sodium 1500mg , cholesterol 200mg , calories 30% from fat and 7% or less from saturated fats, daily to have 5 or more servings of fruits and vegetables.  Biometrics:     Pre Biometrics - 10/15/16 1139      Pre Biometrics   Waist Circumference 36.5 inches   Hip Circumference 38.5 inches   Waist to Hip  Ratio 0.95 %   Triceps Skinfold 11 mm   % Body Fat 23.2 %   Grip Strength 38 kg   Flexibility 11.5 in   Single Leg Stand 25 seconds       Nutrition Therapy Plan and Nutrition Goals:     Nutrition Therapy & Goals - 10/15/16 1527      Nutrition Therapy   Diet Carb Modified, Therapeutic Lifestyle Choices     Personal Nutrition Goals   Nutrition Goal Pt to identify and limit food sources of saturated fat, trans fat, and sodium     Intervention Plan   Intervention Prescribe, educate and counsel regarding individualized specific dietary modifications aiming towards targeted core components such as weight, hypertension, lipid management, diabetes, heart failure and other comorbidities.   Expected Outcomes Short Term Goal: Understand basic principles of dietary content, such as calories, fat, sodium, cholesterol and nutrients.;Long Term Goal: Adherence to prescribed nutrition plan.      Nutrition Discharge: Nutrition Scores:     Nutrition Assessments - 10/15/16 1527      MEDFICTS Scores   Pre Score 84      Nutrition Goals Re-Evaluation:   Nutrition Goals Re-Evaluation:   Nutrition Goals Discharge (Final Nutrition Goals Re-Evaluation):   Psychosocial: Target Goals: Acknowledge presence or absence of significant depression and/or stress, maximize coping skills, provide positive support system. Participant is able to verbalize types and ability to use techniques and skills needed for reducing stress and depression.  Initial Review & Psychosocial Screening:     Initial Psych Review & Screening - 10/15/16 1037      Initial Review   Current issues with Current Anxiety/Panic  QOL scores indicate health related anxiety      Family Dynamics   Good Support System? Yes  sister   Comments QOL scores indicate  health related anxiety, although during brief assessment pt does not verbalize this concern.  Full assessment will be performed at next scheduled appointment.       Barriers   Psychosocial barriers to participate in program There are no identifiable barriers or psychosocial needs.     Screening Interventions   Interventions Encouraged to exercise;Provide feedback about the scores to participant      Quality of Life Scores:     Quality of Life - 10/15/16 1038      Quality of Life Scores   Health/Function Pre 11 %   Socioeconomic Pre 11.38 %   Psych/Spiritual Pre 10.57 %   Family Pre 11.6 %   GLOBAL Pre 11.09 %      PHQ-9: Recent Review Flowsheet Data    There is no flowsheet data to display.     Interpretation of Total Score  Total Score Depression Severity:  1-4 = Minimal depression, 5-9 = Mild depression, 10-14 = Moderate depression, 15-19 = Moderately severe depression, 20-27 = Severe depression   Psychosocial Evaluation and Intervention:   Psychosocial Re-Evaluation:   Psychosocial Discharge (Final Psychosocial Re-Evaluation):   Vocational Rehabilitation: Provide vocational rehab assistance to qualifying candidates.   Vocational Rehab Evaluation & Intervention:     Vocational Rehab - 10/15/16 1037      Initial Vocational Rehab Evaluation & Intervention   Assessment shows need for Vocational Rehabilitation --  pt did not complete voc rehab assessment      Education: Education Goals: Education classes will be provided on a weekly basis, covering required topics. Participant will state understanding/return demonstration of topics presented.  Learning Barriers/Preferences:     Learning Barriers/Preferences - 10/15/16 0912      Learning Barriers/Preferences   Learning Barriers Sight;Hearing  need hearing aids   Learning Preferences Video;Skilled Demonstration;Pictoral      Education Topics: Count Your Pulse:  -Group instruction provided by verbal instruction, demonstration, patient participation and written materials to support subject.  Instructors address importance of being able to find your pulse and how  to count your pulse when at home without a heart monitor.  Patients get hands on experience counting their pulse with staff help and individually.   Heart Attack, Angina, and Risk Factor Modification:  -Group instruction provided by verbal instruction, video, and written materials to support subject.  Instructors address signs and symptoms of angina and heart attacks.    Also discuss risk factors for heart disease and how to make changes to improve heart health risk factors.   Functional Fitness:  -Group instruction provided by verbal instruction, demonstration, patient participation, and written materials to support subject.  Instructors address safety measures for doing things around the house.  Discuss how to get up and down off the floor, how to pick things up properly, how to safely get out of a chair without assistance, and balance training.   Meditation and Mindfulness:  -Group instruction provided by verbal instruction, patient participation, and written materials to support subject.  Instructor addresses importance of mindfulness and meditation practice to help reduce stress and improve awareness.  Instructor also leads participants through a meditation exercise.    Stretching for Flexibility and Mobility:  -Group instruction provided by verbal instruction, patient participation, and written materials to support subject.  Instructors lead participants through series of stretches that are designed to increase flexibility thus improving mobility.  These stretches are additional exercise for major muscle groups that are typically performed during regular warm up and cool down.  Hands Only CPR:  -Group verbal, video, and participation provides a basic overview of AHA guidelines for community CPR. Role-play of emergencies allow participants the opportunity to practice calling for help and chest compression technique with discussion of AED use.   Hypertension: -Group verbal and written  instruction that provides a basic overview of hypertension including the most recent diagnostic guidelines, risk factor reduction with self-care instructions and medication management.    Nutrition I class: Heart Healthy Eating:  -Group instruction provided by PowerPoint slides, verbal discussion, and written materials to support subject matter. The instructor gives an explanation and review of the Therapeutic Lifestyle Changes diet recommendations, which includes a discussion on lipid goals, dietary fat, sodium, fiber, plant stanol/sterol esters, sugar, and the components of a well-balanced, healthy diet.   Nutrition II class: Lifestyle Skills:  -Group instruction provided by PowerPoint slides, verbal discussion, and written materials to support subject matter. The instructor gives an explanation and review of label reading, grocery shopping for heart health, heart healthy recipe modifications, and ways to make healthier choices when eating out.   Diabetes Question & Answer:  -Group instruction provided by PowerPoint slides, verbal discussion, and written materials to support subject matter. The instructor gives an explanation and review of diabetes co-morbidities, pre- and post-prandial blood glucose goals, pre-exercise blood glucose goals, signs, symptoms, and treatment of hypoglycemia and hyperglycemia, and foot care basics.   Diabetes Blitz:  -Group instruction provided by PowerPoint slides, verbal discussion, and written materials to support subject matter. The instructor gives an explanation and review of the physiology behind type 1 and type 2 diabetes, diabetes medications and rational behind using different medications, pre- and post-prandial blood glucose recommendations and Hemoglobin A1c goals, diabetes diet, and exercise including blood glucose guidelines for exercising safely.    Portion Distortion:  -Group instruction provided by PowerPoint slides, verbal discussion, written  materials, and food models to support subject matter. The instructor gives an explanation of serving size versus portion size, changes in portions sizes over the last 20 years, and what consists of a serving from each food group.   Stress Management:  -Group instruction provided by verbal instruction, video, and written materials to support subject matter.  Instructors review role of stress in heart disease and how to cope with stress positively.     Exercising on Your Own:  -Group instruction provided by verbal instruction, power point, and written materials to support subject.  Instructors discuss benefits of exercise, components of exercise, frequency and intensity of exercise, and end points for exercise.  Also discuss use of nitroglycerin and activating EMS.  Review options of places to exercise outside of rehab.  Review guidelines for sex with heart disease.   Cardiac Drugs I:  -Group instruction provided by verbal instruction and written materials to support subject.  Instructor reviews cardiac drug classes: antiplatelets, anticoagulants, beta blockers, and statins.  Instructor discusses reasons, side effects, and lifestyle considerations for each drug class.   Cardiac Drugs II:  -Group instruction provided by verbal instruction and written materials to support subject.  Instructor reviews cardiac drug classes: angiotensin converting enzyme inhibitors (ACE-I), angiotensin II receptor blockers (ARBs), nitrates, and calcium channel blockers.  Instructor discusses reasons, side effects, and lifestyle considerations for each drug class.   Anatomy and Physiology of the Circulatory System:  Group verbal and written instruction and models provide basic cardiac anatomy and physiology, with the coronary electrical and arterial systems. Review of: AMI, Angina, Valve disease, Heart Failure, Peripheral Artery Disease, Cardiac Arrhythmia,  Pacemakers, and the ICD.   Other Education:  -Group or  individual verbal, written, or video instructions that support the educational goals of the cardiac rehab program.   Knowledge Questionnaire Score:     Knowledge Questionnaire Score - 10/15/16 1133      Knowledge Questionnaire Score   Pre Score 23/28     DM 10/15      Core Components/Risk Factors/Patient Goals at Admission:     Personal Goals and Risk Factors at Admission - 10/15/16 1150      Core Components/Risk Factors/Patient Goals on Admission   Hypertension Yes   Intervention Provide education on lifestyle modifcations including regular physical activity/exercise, weight management, moderate sodium restriction and increased consumption of fresh fruit, vegetables, and low fat dairy, alcohol moderation, and smoking cessation.;Monitor prescription use compliance.   Expected Outcomes Short Term: Continued assessment and intervention until BP is < 140/4mm HG in hypertensive participants. < 130/36mm HG in hypertensive participants with diabetes, heart failure or chronic kidney disease.;Long Term: Maintenance of blood pressure at goal levels.   Lipids Yes   Intervention Provide education and support for participant on nutrition & aerobic/resistive exercise along with prescribed medications to achieve LDL 70mg , HDL >40mg .   Expected Outcomes Short Term: Participant states understanding of desired cholesterol values and is compliant with medications prescribed. Participant is following exercise prescription and nutrition guidelines.;Long Term: Cholesterol controlled with medications as prescribed, with individualized exercise RX and with personalized nutrition plan. Value goals: LDL < 70mg , HDL > 40 mg.      Core Components/Risk Factors/Patient Goals Review:      Goals and Risk Factor Review    Row Name 10/22/16 1555             Core Components/Risk Factors/Patient Goals Review   Personal Goals Review Diabetes;Hypertension;Lipids;Stress       Expected Outcomes pt will  participate in CR exercise, nutrition and lifestyle modification education classes to decrease overall CAD RF.           Core Components/Risk Factors/Patient Goals at Discharge (Final Review):      Goals and Risk Factor Review - 10/22/16 1555      Core Components/Risk Factors/Patient Goals Review   Personal Goals Review Diabetes;Hypertension;Lipids;Stress   Expected Outcomes pt will participate in CR exercise, nutrition and lifestyle modification education classes to decrease overall CAD RF.       ITP Comments:     ITP Comments    Row Name 10/15/16 0903 10/22/16 1555         ITP Comments Dr. Fransico Him, Medical Director Dr. Fransico Him, Medical Director         Comments:  Recommend exercise and life style modification education including  stress management and relaxation techniques to decrease cardiac risk profile.  Pt scheduled to begin group exercise class 10/25/2016.

## 2016-10-23 ENCOUNTER — Encounter (HOSPITAL_COMMUNITY): Payer: Medicare Other

## 2016-10-25 ENCOUNTER — Encounter (HOSPITAL_COMMUNITY)
Admission: RE | Admit: 2016-10-25 | Discharge: 2016-10-25 | Disposition: A | Discharge status: Home / Self Care | Payer: Medicare Other | Source: Ambulatory Visit | Attending: Cardiology | Admitting: Cardiology

## 2016-10-25 ENCOUNTER — Ambulatory Visit: Payer: Self-pay | Admitting: Cardiology

## 2016-10-25 DIAGNOSIS — Z9861 Coronary angioplasty status: Secondary | ICD-10-CM | POA: Insufficient documentation

## 2016-10-25 DIAGNOSIS — Z955 Presence of coronary angioplasty implant and graft: Secondary | ICD-10-CM

## 2016-10-25 DIAGNOSIS — Z48812 Encounter for surgical aftercare following surgery on the circulatory system: Secondary | ICD-10-CM | POA: Insufficient documentation

## 2016-10-28 ENCOUNTER — Encounter (HOSPITAL_COMMUNITY): Payer: Medicare Other

## 2016-10-30 ENCOUNTER — Encounter (HOSPITAL_COMMUNITY): Payer: Medicare Other

## 2016-10-30 ENCOUNTER — Telehealth (HOSPITAL_COMMUNITY): Payer: Self-pay | Admitting: Cardiac Rehabilitation

## 2016-10-30 NOTE — Telephone Encounter (Signed)
pc to pt to discuss reason for continued absence from cardiac rehab. LMOM

## 2016-11-01 ENCOUNTER — Encounter (HOSPITAL_COMMUNITY): Admission: RE | Admit: 2016-11-01 | Payer: Medicare Other | Source: Ambulatory Visit

## 2016-11-04 ENCOUNTER — Other Ambulatory Visit: Payer: Self-pay

## 2016-11-04 ENCOUNTER — Encounter (HOSPITAL_COMMUNITY): Payer: Medicare Other

## 2016-11-04 MED ORDER — ROSUVASTATIN CALCIUM 5 MG PO TABS: 5.0000 mg | ORAL_TABLET | Freq: Every day | ORAL | 3 refills | Status: DC

## 2016-11-06 ENCOUNTER — Encounter (HOSPITAL_COMMUNITY): Payer: Medicare Other

## 2016-11-08 ENCOUNTER — Encounter (HOSPITAL_COMMUNITY): Payer: Medicare Other

## 2016-11-11 ENCOUNTER — Encounter (HOSPITAL_COMMUNITY): Payer: Medicare Other

## 2016-11-13 ENCOUNTER — Encounter (HOSPITAL_COMMUNITY): Payer: Medicare Other

## 2016-11-15 ENCOUNTER — Encounter (HOSPITAL_COMMUNITY): Payer: Medicare Other

## 2016-11-17 ENCOUNTER — Other Ambulatory Visit: Payer: Self-pay | Admitting: Physician Assistant

## 2016-11-18 ENCOUNTER — Encounter: Payer: Self-pay | Admitting: Cardiology

## 2016-11-18 ENCOUNTER — Other Ambulatory Visit: Payer: Self-pay | Admitting: Physician Assistant

## 2016-11-18 ENCOUNTER — Encounter (HOSPITAL_COMMUNITY): Payer: Medicare Other

## 2016-11-20 ENCOUNTER — Encounter (HOSPITAL_COMMUNITY): Payer: Medicare Other

## 2016-11-22 ENCOUNTER — Encounter (HOSPITAL_COMMUNITY): Payer: Medicare Other

## 2016-11-25 ENCOUNTER — Encounter (HOSPITAL_COMMUNITY): Payer: Medicare Other

## 2016-11-27 ENCOUNTER — Encounter (HOSPITAL_COMMUNITY): Payer: Medicare Other

## 2016-11-29 ENCOUNTER — Encounter (HOSPITAL_COMMUNITY): Payer: Medicare Other

## 2016-12-02 ENCOUNTER — Encounter (HOSPITAL_COMMUNITY): Payer: Medicare Other

## 2016-12-04 ENCOUNTER — Encounter (HOSPITAL_COMMUNITY): Payer: Medicare Other

## 2016-12-05 ENCOUNTER — Ambulatory Visit: Payer: Self-pay | Admitting: Cardiology

## 2016-12-06 ENCOUNTER — Encounter (HOSPITAL_COMMUNITY): Payer: Medicare Other

## 2016-12-11 ENCOUNTER — Encounter (HOSPITAL_COMMUNITY): Payer: Medicare Other

## 2016-12-13 ENCOUNTER — Encounter (HOSPITAL_COMMUNITY): Payer: Medicare Other

## 2016-12-16 ENCOUNTER — Encounter (HOSPITAL_COMMUNITY): Payer: Medicare Other

## 2016-12-18 ENCOUNTER — Encounter (HOSPITAL_COMMUNITY): Payer: Medicare Other

## 2016-12-18 ENCOUNTER — Other Ambulatory Visit: Payer: Self-pay | Admitting: Cardiology

## 2016-12-20 ENCOUNTER — Encounter (HOSPITAL_COMMUNITY): Payer: Medicare Other

## 2016-12-23 ENCOUNTER — Encounter (HOSPITAL_COMMUNITY): Payer: Medicare Other

## 2016-12-25 ENCOUNTER — Encounter (HOSPITAL_COMMUNITY): Payer: Medicare Other

## 2016-12-27 ENCOUNTER — Encounter (HOSPITAL_COMMUNITY): Payer: Medicare Other

## 2016-12-30 ENCOUNTER — Encounter (HOSPITAL_COMMUNITY): Payer: Medicare Other

## 2017-01-01 ENCOUNTER — Encounter (HOSPITAL_COMMUNITY): Payer: Medicare Other

## 2017-01-03 ENCOUNTER — Encounter (HOSPITAL_COMMUNITY): Payer: Medicare Other

## 2017-01-06 ENCOUNTER — Encounter (HOSPITAL_COMMUNITY): Payer: Medicare Other

## 2017-01-07 DIAGNOSIS — R0602 Shortness of breath: Secondary | ICD-10-CM | POA: Diagnosis not present

## 2017-01-07 DIAGNOSIS — M545 Low back pain: Secondary | ICD-10-CM | POA: Diagnosis not present

## 2017-01-07 DIAGNOSIS — M3130 Wegener's granulomatosis without renal involvement: Secondary | ICD-10-CM | POA: Diagnosis not present

## 2017-01-07 DIAGNOSIS — M150 Primary generalized (osteo)arthritis: Secondary | ICD-10-CM | POA: Diagnosis not present

## 2017-01-08 ENCOUNTER — Encounter (HOSPITAL_COMMUNITY): Payer: Medicare Other

## 2017-01-10 ENCOUNTER — Encounter (HOSPITAL_COMMUNITY): Payer: Medicare Other

## 2017-01-10 ENCOUNTER — Ambulatory Visit: Payer: Self-pay | Admitting: Internal Medicine

## 2017-01-13 ENCOUNTER — Encounter (HOSPITAL_COMMUNITY): Payer: Medicare Other

## 2017-01-15 ENCOUNTER — Encounter (HOSPITAL_COMMUNITY): Payer: Medicare Other

## 2017-01-17 ENCOUNTER — Encounter (HOSPITAL_COMMUNITY): Payer: Medicare Other

## 2017-01-20 ENCOUNTER — Encounter (HOSPITAL_COMMUNITY): Payer: Medicare Other

## 2017-01-22 ENCOUNTER — Encounter (HOSPITAL_COMMUNITY): Payer: Medicare Other

## 2017-01-22 DIAGNOSIS — D122 Benign neoplasm of ascending colon: Secondary | ICD-10-CM | POA: Diagnosis not present

## 2017-01-22 DIAGNOSIS — M3130 Wegener's granulomatosis without renal involvement: Secondary | ICD-10-CM | POA: Diagnosis not present

## 2017-01-22 DIAGNOSIS — D125 Benign neoplasm of sigmoid colon: Secondary | ICD-10-CM | POA: Diagnosis not present

## 2017-01-22 DIAGNOSIS — Z23 Encounter for immunization: Secondary | ICD-10-CM | POA: Diagnosis not present

## 2017-01-24 DIAGNOSIS — H40052 Ocular hypertension, left eye: Secondary | ICD-10-CM | POA: Diagnosis not present

## 2017-01-27 DIAGNOSIS — N183 Chronic kidney disease, stage 3 (moderate): Secondary | ICD-10-CM | POA: Diagnosis not present

## 2017-02-06 DIAGNOSIS — D631 Anemia in chronic kidney disease: Secondary | ICD-10-CM | POA: Diagnosis not present

## 2017-02-06 DIAGNOSIS — N183 Chronic kidney disease, stage 3 (moderate): Secondary | ICD-10-CM | POA: Diagnosis not present

## 2017-02-06 DIAGNOSIS — M3130 Wegener's granulomatosis without renal involvement: Secondary | ICD-10-CM | POA: Diagnosis not present

## 2017-03-14 ENCOUNTER — Encounter (INDEPENDENT_AMBULATORY_CARE_PROVIDER_SITE_OTHER): Payer: Self-pay

## 2017-03-14 ENCOUNTER — Telehealth: Payer: Self-pay | Admitting: Acute Care

## 2017-03-14 ENCOUNTER — Ambulatory Visit (INDEPENDENT_AMBULATORY_CARE_PROVIDER_SITE_OTHER)
Admission: RE | Admit: 2017-03-14 | Discharge: 2017-03-14 | Disposition: A | Discharge status: Home / Self Care | Payer: Medicare Other | Source: Ambulatory Visit | Attending: Acute Care | Admitting: Acute Care

## 2017-03-14 DIAGNOSIS — R911 Solitary pulmonary nodule: Secondary | ICD-10-CM

## 2017-03-14 DIAGNOSIS — R918 Other nonspecific abnormal finding of lung field: Secondary | ICD-10-CM | POA: Diagnosis not present

## 2017-03-14 NOTE — Telephone Encounter (Signed)
[name], Please look at this patient's most recent CT Chest and let me know if you agree on waiting 12 months for the next scan. This patient does not qualify for the screening program because he quit smoking 25 years ago. If you agree with a 12 month interval we can place the order. Thanks so much.

## 2017-03-18 ENCOUNTER — Ambulatory Visit: Payer: Self-pay | Admitting: Cardiology

## 2017-03-20 NOTE — Telephone Encounter (Signed)
I would be comfortable is 6-9 months and then after 1 year o fstability we can do it annually  Dr. Brand Males, M.D., [facility].C.P Pulmonary and Critical Care Medicine Staff Physician, [place] Director - Interstitial Lung Disease  Program  Pulmonary [place] [facility] [place], [place], [address]  Pager: [phone], If no answer or between  15:00h - 7:00h: call [phone] Telephone: [phone]

## 2017-03-20 NOTE — Telephone Encounter (Signed)
Jonelle Sidle, Please see Dr. Golden Pop note. Please order follow up CT in 6-9 months. Please call patient and let them know this is for nodule surveillance per MR. Let him know if the 6-9 month looks ok, we can go to annual screening. Thanks so much.

## 2017-03-21 NOTE — Telephone Encounter (Signed)
I tried to call pt X 3 no dial tone. Will try again later.

## 2017-03-24 ENCOUNTER — Ambulatory Visit: Payer: Medicare Other | Admitting: Internal Medicine

## 2017-03-24 ENCOUNTER — Encounter: Payer: Self-pay | Admitting: Internal Medicine

## 2017-03-24 ENCOUNTER — Other Ambulatory Visit (INDEPENDENT_AMBULATORY_CARE_PROVIDER_SITE_OTHER): Payer: Medicare Other

## 2017-03-24 ENCOUNTER — Encounter: Payer: Self-pay | Admitting: Gastroenterology

## 2017-03-24 VITALS — BP 118/64 | HR 50 | Ht 69.0 in | Wt 169.6 lb

## 2017-03-24 DIAGNOSIS — Z8679 Personal history of other diseases of the circulatory system: Secondary | ICD-10-CM

## 2017-03-24 DIAGNOSIS — R1310 Dysphagia, unspecified: Secondary | ICD-10-CM | POA: Diagnosis not present

## 2017-03-24 DIAGNOSIS — Z87891 Personal history of nicotine dependence: Secondary | ICD-10-CM

## 2017-03-24 DIAGNOSIS — R911 Solitary pulmonary nodule: Secondary | ICD-10-CM

## 2017-03-24 DIAGNOSIS — R0600 Dyspnea, unspecified: Secondary | ICD-10-CM

## 2017-03-24 DIAGNOSIS — R0689 Other abnormalities of breathing: Secondary | ICD-10-CM

## 2017-03-24 LAB — SEDIMENTATION RATE: SED RATE: 12 mm/h (ref 0–20)

## 2017-03-24 NOTE — Progress Notes (Signed)
Subjective:     Patient ID: Brian Hudson, male   DOB: [birth year], 66 y.o.   MRN: [id number]  HPI PCP Josetta Huddle, MD   IOV 08/21/2016  Chief Complaint  Patient presents with  . Pulm Consult    Referred by Leafy Kindle PA-C for vasculitis.    66 year old male referred by Dr. Gavin Pound and of physician assistant for evaluation of shortness of breath in the setting of previous diagnosis of Wegner's. There is very little outside information but in terms of his Wegener's granulomatosis he tells me that he was diagnosed with this in 2004 following significant sinus symptoms including bloody sinus drainage. This was at [facility]. He was treated for approximately one year with Cytoxan and subsequently on maintenance methotrexate. His last treatment for Wegener's was in 2008. Since then he's been on observation therapy. Treatment Back then  included prednisone as well. Then approximately 2 or 3 years ago he switched care to Dr. Gavin Pound and rheumatology. According to him his been on observation therapy there as well. He tells me now for the last year or 2 he's been more fatigue associated with shortness of breath. He used to walk 2 miles a day but now is unable to do that. Dyspnea is present all the time but definitely more so with exertion and relieved by rest. There is associated cough and sometimes hemoptysis especially early in the morning but this is been ongoing for several years. He thinks his Wegener's might be active. He believes Dr. Gavin Pound is done some blood work to look for vasculitis activity but I do not have those results with me. A CT scan of the chest was done that showed left upper lobe groundglass opacity and therefore he is been here. He denies any chest pain     FeNO 34ppb 08/21/2016 and slightly high  Walking desaturation test on 08/21/2016 185 feet x 3 laps on RA:  did NOT desaturate. Rest pulse ox was 100%, final pulse ox was 90. HR response 50/min at  rest to 62/min at peak exertion.    IMPRESSION: CT scan of the chest personally visualized 1. No acute abnormality is seen on CT of the chest. 2. Coronary artery calcifications. 3. Minimal ground-glass opacity in the left upper lobe of doubtful significance. Initial follow-up with CT at 6-12 months is recommended to confirm persistence. If persistent, repeat CT is recommended every 2 years until 5 years of stability has been established. This recommendation follows the consensus statement: Guidelines for Management of Incidental Pulmonary Nodules Detected on CT Images: From the Fleischner Society 2017; Radiology 2017; 284:228-243.   Electronically Signed   By: Ivar Drape M.D.   On: 06/11/2016 17:14   09/11/2016 PFT' Follow Up: Brian Hudson is a 66 y.o. male former smoker quit in 1994 with history of Wegner's Disease since 2004, and dyspnea on exertion.  Pt. Was seen by [name] 08/21/2016 for  dyspnea .Plan at that time was as follows: PLAN  - start symbicort 2 puff twice daily - take sample worth 1 month  - do ono test room air next few to several days  - refer cardiology  - [name] or [facility] first available - sign record releast to get more info on wegner from [name] esp blood work  - do full PFT  Patient presents today for follow-up. Patient has been compliant with Symbicort since 08/21/2016. He states he cannot tell any significant difference. In his dyspnea. He states  he continues to get short of breath at times with exercise and also when talking. He states his shortness of breath does not wake him from sleep. He continues to be unable to walk as he has done in the past. He was seen by [name] at [facility] heart care on 09/06/2016. Of note there is a significant family history which includes coronary artery disease in his father, who had MI at the age of 61.Risk factors include CAD, DM, HLD, + FH premature CAD, hx tobacco use and coronary calcifications on CT of  chest.The patient is Scheduled for an exercise stress test 6/14 and an event monitor 6/14 to rule out tachy bradycardia syndrome. He states he does have secretions in the morning, yellowish with occasional blood tinged secretions, that self resolves..Lab work has not been sent from Dr. Trudie Reed In [place], therefore I am unable to review. Patient denies any fever, chest pain, orthopnea, or hemoptysis.   Test Results:  CT angiogram: 06/11/2016 No acute abnormality is seen on CT of the chest. 2. Coronary artery calcifications. 3. Minimal ground-glass opacity in the left upper lobe of doubtful significance. Initial follow-up with CT at 6-12 months is recommended to confirm persistence  Ambulatory saturation: 08/21/2016 185 feet 3 laps on room air. Did not desaturate. Resting pulse ox was 100%. Final pulse ox was 90%. Heart rate response 50/m at rest is 62/m at peak exertion.  EKG 09/06/2016 Sinus bradycardia with poor R-wave progression, heart rate 44   OV 03/24/2017 Chief Complaint  Patient presents with  . Follow-up    CT scan 03/14/17.  Pt states that he still has some tightness in chest, has occ coughing, and is SOB all the time.  Pt also states that he has gotten choked several times while trying to eat.   Remote Wegner's granulomatosis in 2004 status post therapy. Remote smoking history  Follow-up shortness of breath: eing seen in pulmonary clinic for shortness of breath. Earlier this year we referred him to cardiology on basis of coronary artery calcification and strong family history. He status post coronary artery stent 2. After that his cardiac rehabilitation. Dyspnea is only some better he still has residual dyspnea  Follow-up left upper lobe lung nodule is a groundglass opacity. He had follow-up CT scan of the chest without contrast 03/14/2017. This is documented below. It appears the ground glass opacity persists but it is also "close to 2 cm. However he says that his [name]  is under remission. He did see Dr. Trudie Reed earlier this year and apparently blood work was normal.  New issue: Is complaining of nonspecific dysphagia 4 times in the last 2 weeks. It is for solids. He is pretty significant. No associated vomiting or aspiration. His sister confirms the same.  IMPRESSION: ct chest 03/14/17 Persistent 12 x 19 mm ground-glass opacity in the central left upper Lobe. Adenocarcinoma cannot be excluded. Follow up by CT is recommended in 12 months, with continued annual surveillance for a minimum of 3 Years. These recommendations are taken from: Recommendations for the Management of Subsolid Pulmonary Nodules Detected at CT: A Statement from the [employer] 2013; 266:1, [id number].  Aortic Atherosclerosis (ICD10-I70.0).   Electronically Signed   By: Julian Hy M.D.   On: 03/14/2017 09:16   has a past medical history of Anxiety, CAD (coronary artery disease), Concussion, DM (diabetes mellitus) ([place]), ED (erectile dysfunction), Fracture (2010), GERD (gastroesophageal reflux disease), HTN (hypertension), Hyperlipidemia, IBS (irritable bowel syndrome), Palpitations, Prostate cancer ([place]) (2007), Sigmoid diverticulosis,  and Wegener's granulomatosis ([place]).   reports that he quit smoking about 25 years ago. His smoking use included cigarettes. He has a 105.00 pack-year smoking history. he has never used smokeless tobacco.  Past Surgical History:  Procedure Laterality Date  . COLONOSCOPY N/A 06/24/2016   Procedure: COLONOSCOPY with propofol;  Surgeon: Garlan Fair, MD;  Location: [facility];  Service: Endoscopy;  Laterality: N/A;  . colonscopy  2008  . CORONARY STENT INTERVENTION N/A 09/20/2016   Procedure: Coronary Stent Intervention;  Surgeon: Leonie Man, MD;  Location: [place] [facility];  Service: Cardiovascular;  Laterality: N/A;  . INSERTION PROSTATE RADIATION SEED  2007  . INTRAVASCULAR PRESSURE WIRE/FFR STUDY N/A 09/20/2016    Procedure: Intravascular Pressure Wire/FFR Study;  Surgeon: Leonie Man, MD;  Location: [place] [facility];  Service: Cardiovascular;  Laterality: N/A;  . LEFT HEART CATH AND CORONARY ANGIOGRAPHY N/A 09/20/2016   Procedure: Left Heart Cath and Coronary Angiography;  Surgeon: Leonie Man, MD;  Location: [place] [facility];  Service: Cardiovascular;  Laterality: N/A;  . ROTATOR CUFF REPAIR Right     Allergies  Allergen Reactions  . Codeine Other (See Comments)    "jittery"  . Lipitor [Atorvastatin] Other (See Comments)    arthralgia  . Zetia [Ezetimibe] Other (See Comments)    arthralgia  . Zocor [Simvastatin] Other (See Comments)    arthralgia    Immunization History  Administered Date(s) Administered  . Influenza, High Dose Seasonal PF 01/06/2017  . Pneumococcal Polysaccharide-23 02/19/2012    Family History  Problem Relation Age of Onset  . Heart attack Father 76  . CAD Father   . Diabetic kidney disease Mother   . Healthy Sister   . Cancer Brother   . Healthy Sister      Current Outpatient Medications:  .  acetaminophen (TYLENOL) 325 MG tablet, Take 650 mg by mouth every morning. , Disp: , Rfl:  .  amLODipine (NORVASC) 10 MG tablet, Take 10 mg by mouth daily., Disp: , Rfl: 3 .  aspirin EC 81 MG tablet, Take 1 tablet (81 mg total) by mouth daily., Disp: 30 tablet, Rfl: 0 .  budesonide-formoterol (SYMBICORT) 160-4.5 MCG/ACT inhaler, Inhale 2 puffs into the lungs 2 (two) times daily., Disp: 1 Inhaler, Rfl: 5 .  clopidogrel (PLAVIX) 75 MG tablet, Take 1 tablet (75 mg total) by mouth daily., Disp: 90 tablet, Rfl: 3 .  escitalopram (LEXAPRO) 10 MG tablet, Take 10 mg by mouth daily., Disp: , Rfl:  .  hydrocortisone cream 1 %, Apply 1 application topically daily as needed for itching., Disp: , Rfl:  .  metFORMIN (GLUCOPHAGE-XR) 500 MG 24 hr tablet, Take 500 mg by mouth daily with supper., Disp: , Rfl:  .  Multiple Vitamin (MULTIVITAMIN WITH MINERALS) TABS tablet, Take 1  tablet by mouth daily., Disp: , Rfl:  .  nitroGLYCERIN (NITROSTAT) 0.4 MG SL tablet, Place 1 tablet (0.4 mg total) under the tongue every 5 (five) minutes as needed for chest pain., Disp: 28 tablet, Rfl: 3 .  pantoprazole (PROTONIX) 40 MG tablet, Take 40 mg by mouth daily., Disp: , Rfl:  .  rosuvastatin (CRESTOR) 5 MG tablet, Take 5 mg by mouth daily., Disp: , Rfl: 3 .  sodium chloride (OCEAN) 0.65 % SOLN nasal spray, Place 1 spray into both nostrils as needed for congestion., Disp: , Rfl:  .  amLODipine (NORVASC) 10 MG tablet, Take 1 tablet (10 mg total) by mouth daily., Disp: 180 tablet, Rfl:  3 .  omeprazole (PRILOSEC) 20 MG capsule, Take 20 mg by mouth 2 (two) times daily before a meal. , Disp: , Rfl: 10 .  rosuvastatin (CRESTOR) 5 MG tablet, Take 1 tablet (5 mg total) by mouth daily., Disp: 90 tablet, Rfl: 3   Review of Systems     Objective:   Physical Exam  Constitutional: He is oriented to person, place, and time. He appears well-developed and well-nourished. No distress.  HENT:  Head: Normocephalic and atraumatic.  Right Ear: External ear normal.  Left Ear: External ear normal.  Mouth/Throat: Oropharynx is clear and moist. No oropharyngeal exudate.  Eyes: Conjunctivae and EOM are normal. Pupils are equal, round, and reactive to light. Right eye exhibits no discharge. Left eye exhibits no discharge. No scleral icterus.  Neck: Normal range of motion. Neck supple. No JVD present. No tracheal deviation present. No thyromegaly present.  Cardiovascular: Normal rate, regular rhythm and intact distal pulses. Exam reveals no gallop and no friction rub.  No murmur heard. Pulmonary/Chest: Effort normal and breath sounds normal. No respiratory distress. He has no wheezes. He has no rales. He exhibits no tenderness.  Abdominal: Soft. Bowel sounds are normal. He exhibits no distension and no mass. There is no tenderness. There is no rebound and no guarding.  Musculoskeletal: Normal range of  motion. He exhibits edema. He exhibits no tenderness.  Lymphadenopathy:    He has no cervical adenopathy.  Neurological: He is alert and oriented to person, place, and time. He has normal reflexes. No cranial nerve deficit. Coordination normal.  Skin: Skin is warm and dry. No rash noted. He is not diaphoretic. No erythema. No pallor.  Psychiatric: He has a normal mood and affect. His behavior is normal. Judgment and thought content normal.  Nursing note and vitals reviewed.  Vitals:   03/24/17 1352  BP: 118/64  Pulse: (!) 50  SpO2: 99%  Weight: 169 lb 9.6 oz (76.9 kg)  Height: '5\' 9"'  (1.753 m)    Estimated body mass index is 25.05 kg/m as calculated from the following:   Height as of this encounter: '5\' 9"'  (1.753 m).   Weight as of this encounter: 169 lb 9.6 oz (76.9 kg).     Assessment:       ICD-10-CM   1. Nodule of left lung R91.1   2. History of Wegener's granulomatosis Z86.79   3. Stopped smoking with greater than 40 pack year history Z87.891   4. Dyspnea and respiratory abnormalities R06.00    R06.89   5. Dysphagia, unspecified type R13.10        Plan:     Nodule of left lung History of Wegener's granulomatosis Stopped smoking with greater than 40 pack year history   - Is a nearly 2 cm nodule in the left lung upper part -This could be nonspecific inflammation or low-grade lung cancer or related to Wegener's - do ANCA screen, GBM, MPO and PR-3 antibody test and ESR blood work 03/24/2017 - do PET scan will call with result   Dyspnea and respiratory abnormalities - address in due course - Glad some of his shortness of breath is better after stent placement in 2018 with there is still some residual shortness of breath for which she might need a pulmonary stress test but we will address this later oon later n  Dysphagia, unspecified  - refer to [facility] for this new problem  Followup - return to See Dr Chase Caller after PET scan and blood work  Dr. Brand Males, M.D., [facility].C.P Pulmonary and Critical Care Medicine Staff Physician, [place] Director - Interstitial Lung Disease  Program  Pulmonary [place] at [place], [place], [address]  Pager: [phone], If no answer or between  15:00h - 7:00h: call [phone] Telephone: [phone]

## 2017-03-24 NOTE — Addendum Note (Signed)
Addended by: Lorretta Harp on: 03/24/2017 02:32 PM   Modules accepted: Orders

## 2017-03-24 NOTE — Patient Instructions (Signed)
Nodule of left lung History of Wegener's granulomatosis Stopped smoking with greater than 40 pack year history   - Is a nearly 2 cm nodule in the left lung upper part -This could be nonspecific inflammation or low-grade lung cancer or related to Wegener's - do ANCA screen, GBM, MPO and PR-3 antibody test and ESR blood work 03/24/2017 - do PET scan will call with result   Dyspnea and respiratory abnormalities - address in due course - Glad some of his shortness of breath is better after stent placement in 2018 with there is still some residual shortness of breath for which she might need a pulmonary stress test but we will address this later oon later n  Dysphagia, unspecified  - refer to [facility] for this new problem  Followup - return to See Dr Chase Caller after PET scan and blood work

## 2017-03-25 LAB — MPO/PR-3 (ANCA) ANTIBODIES: Serine Protease 3: 1.6 AI — ABNORMAL HIGH

## 2017-03-25 LAB — ANCA SCREEN W REFLEX TITER: ANCA SCREEN: NEGATIVE

## 2017-03-25 LAB — GLOMERULAR BASEMENT MEMBRANE ANTIBODIES

## 2017-04-02 NOTE — Telephone Encounter (Signed)
Order placed for CT. ATC pt, no answer. Left message for pt to call back.

## 2017-04-02 NOTE — Addendum Note (Signed)
Addended by: Jannette Spanner on: 04/02/2017 12:50 PM   Modules accepted: Orders

## 2017-04-10 ENCOUNTER — Ambulatory Visit: Payer: Medicare Other | Admitting: Gastroenterology

## 2017-04-10 ENCOUNTER — Encounter: Payer: Self-pay | Admitting: Gastroenterology

## 2017-04-10 VITALS — BP 120/66 | HR 70 | Ht 69.0 in | Wt 165.2 lb

## 2017-04-10 DIAGNOSIS — K219 Gastro-esophageal reflux disease without esophagitis: Secondary | ICD-10-CM | POA: Insufficient documentation

## 2017-04-10 DIAGNOSIS — R1310 Dysphagia, unspecified: Secondary | ICD-10-CM | POA: Insufficient documentation

## 2017-04-10 MED ORDER — PANTOPRAZOLE SODIUM 40 MG PO TBEC: 40.0000 mg | DELAYED_RELEASE_TABLET | Freq: Two times a day (BID) | ORAL | 2 refills | Status: AC

## 2017-04-10 NOTE — Progress Notes (Signed)
Agree with assessment and plan. Can you let me know what the barium study shows. If he has a stricture or pathology that requires EGD, will need to see if okay with Cardiology to hold plavix for an EGD in case dilation is performed. He would be able to continue aspirin throughout the time when plavix is held.

## 2017-04-10 NOTE — Patient Instructions (Signed)
If you are age 69 or older, your body mass index should be between 23-30. Your Body mass index is 24.4 kg/m. If this is out of the aforementioned range listed, please consider follow up with your Primary Care Provider.  If you are age 66 or younger, your body mass index should be between 19-25. Your Body mass index is 24.4 kg/m. If this is out of the aformentioned range listed, please consider follow up with your Primary Care Provider.   We have given you a written prescription for Pantoprazole. You need to increase this medication to 40mg  twice daily.  You have been scheduled for a Barium Esophogram at [facility] (1st floor of the hospital) on Thursday, January 10th at 10:30am. Please arrive 15 minutes prior to your appointment for registration. Make certain not to have anything to eat or drink 3 hours prior to your test. If you need to reschedule for any reason, please contact radiology at [phone] to do so. __________________________________________________________________ A barium swallow is an examination that concentrates on views of the esophagus. This tends to be a double contrast exam (barium and two liquids which, when combined, create a gas to distend the wall of the oesophagus) or single contrast (non-ionic iodine based). The study is usually tailored to your symptoms so a good history is essential. Attention is paid during the study to the form, structure and configuration of the esophagus, looking for functional disorders (such as aspiration, dysphagia, achalasia, motility and reflux) EXAMINATION You may be asked to change into a gown, depending on the type of swallow being performed. A radiologist and radiographer will perform the procedure. The radiologist will advise you of the type of contrast selected for your procedure and direct you during the exam. You will be asked to stand, sit or lie in several different positions and to hold a small amount of fluid in your mouth  before being asked to swallow while the imaging is performed .In some instances you may be asked to swallow barium coated marshmallows to assess the motility of a solid food bolus. The exam can be recorded as a digital or video fluoroscopy procedure. POST PROCEDURE It will take 1-2 days for the barium to pass through your system. To facilitate this, it is important, unless otherwise directed, to increase your fluids for the next 24-48hrs and to resume your normal diet.  This test typically takes about 30 minutes to perform. __________________________________________________________________________________

## 2017-04-10 NOTE — Progress Notes (Signed)
04/10/2017 [name] [id number] 09-01-1950   HISTORY OF PRESENT ILLNESS: This is a pleasant 67 year old male who is new to our office.  He has been referred here by [name] for evaluation of dysphasia.  He had a screening colonoscopy performed by Dr. Wynetta Emery with a [facility] in March 2018, however.  Anyway, the patient describes difficulty swallowing food for the past couple of months.  He says that it usually only occurs with solid foods, not liquids.  Feels like he gets stuck and will not go down.  He says that on 4 or 5 occasions he's actually had a really difficult time getting it to pass and a couple of times has required the Heimlich maneuver.  He admits to reflux.  He was previously on omeprazole, but was recently switched to pantoprazole 40 mg daily back in June when he had cardiac stents placed.  He is also on Plavix since having those placed.  Denies any significant weight loss.  Has an upcoming appt with Dr. Marlou Porch.    Past Medical History:  Diagnosis Date  . Anxiety   . CAD (coronary artery disease)    a. 09/20/16: 45% OM, 99% ostial ramus s/p DES, 80% proximal LCx s/p DES, 80% more distal LCx on AV groove portion of the circumflex after OM1 s/p cutting balloon and PCTA.   Marland Kitchen Concussion   . DM (diabetes mellitus) ([place])    type 2  . ED (erectile dysfunction)   . Fracture 2010   L 3 and L 4 healed with brace  . GERD (gastroesophageal reflux disease)   . HTN (hypertension)   . Hyperlipidemia   . IBS (irritable bowel syndrome)   . Palpitations    none in last few yrs  . Prostate cancer ([place]) 2007  . Sigmoid diverticulosis   . Wegener's granulomatosis ([place])    followed by dr Berna Bue   Past Surgical History:  Procedure Laterality Date  . COLONOSCOPY N/A 06/24/2016   Procedure: COLONOSCOPY with propofol;  Surgeon: Garlan Fair, MD;  Location: [facility];  Service: Endoscopy;  Laterality: N/A;  . colonscopy  2008  . CORONARY STENT INTERVENTION N/A  09/20/2016   Procedure: Coronary Stent Intervention;  Surgeon: Leonie Man, MD;  Location: [facility];  Service: Cardiovascular;  Laterality: N/A;  . INSERTION PROSTATE RADIATION SEED  2007  . INTRAVASCULAR PRESSURE WIRE/FFR STUDY N/A 09/20/2016   Procedure: Intravascular Pressure Wire/FFR Study;  Surgeon: Leonie Man, MD;  Location: [facility];  Service: Cardiovascular;  Laterality: N/A;  . LEFT HEART CATH AND CORONARY ANGIOGRAPHY N/A 09/20/2016   Procedure: Left Heart Cath and Coronary Angiography;  Surgeon: Leonie Man, MD;  Location: [place];  Service: Cardiovascular;  Laterality: N/A;  . ROTATOR CUFF REPAIR Right     reports that he quit smoking about 26 years ago. His smoking use included cigarettes. He has a 105.00 pack-year smoking history. he has never used smokeless tobacco. He reports that he uses drugs. Drug: Marijuana. He reports that he does not drink alcohol. family history includes CAD in his father; Cancer in his brother; Diabetic kidney disease in his mother; Healthy in his sister and sister; Heart attack (age of onset: 48) in his father. Allergies  Allergen Reactions  . Codeine Other (See Comments)    "jittery"  . Lipitor [Atorvastatin] Other (See Comments)    arthralgia  . Zetia [Ezetimibe] Other (See Comments)    arthralgia  .  Zocor [Simvastatin] Other (See Comments)    arthralgia      Outpatient Encounter Medications as of 04/10/2017  Medication Sig  . acetaminophen (TYLENOL) 325 MG tablet Take 650 mg by mouth every morning.   Marland Kitchen amLODipine (NORVASC) 10 MG tablet Take 10 mg by mouth daily.  Marland Kitchen aspirin EC 81 MG tablet Take 1 tablet (81 mg total) by mouth daily.  . budesonide-formoterol (SYMBICORT) 160-4.5 MCG/ACT inhaler Inhale 2 puffs into the lungs 2 (two) times daily.  . clopidogrel (PLAVIX) 75 MG tablet Take 1 tablet (75 mg total) by mouth daily.  Marland Kitchen escitalopram (LEXAPRO) 10 MG tablet Take 10 mg by mouth daily.  .  hydrocortisone cream 1 % Apply 1 application topically daily as needed for itching.  . metFORMIN (GLUCOPHAGE-XR) 500 MG 24 hr tablet Take 500 mg by mouth daily with supper.  . Multiple Vitamin (MULTIVITAMIN WITH MINERALS) TABS tablet Take 1 tablet by mouth daily.  . nitroGLYCERIN (NITROSTAT) 0.4 MG SL tablet Place 1 tablet (0.4 mg total) under the tongue every 5 (five) minutes as needed for chest pain.  Marland Kitchen omeprazole (PRILOSEC) 20 MG capsule Take 20 mg by mouth 2 (two) times daily before a meal.   . pantoprazole (PROTONIX) 40 MG tablet Take 40 mg by mouth daily.  . rosuvastatin (CRESTOR) 5 MG tablet Take 5 mg by mouth daily.  . sodium chloride (OCEAN) 0.65 % SOLN nasal spray Place 1 spray into both nostrils as needed for congestion.  Marland Kitchen amLODipine (NORVASC) 10 MG tablet Take 1 tablet (10 mg total) by mouth daily.  . rosuvastatin (CRESTOR) 5 MG tablet Take 1 tablet (5 mg total) by mouth daily.   No facility-administered encounter medications on file as of 04/10/2017.      REVIEW OF SYSTEMS  : All other systems reviewed and negative except where noted in the History of Present Illness.   PHYSICAL EXAM: BP 120/66   Pulse 70   Ht 5\' 9"  (1.753 m)   Wt 165 lb 4 oz (75 kg)   SpO2 96%   BMI 24.40 kg/m  General: Well developed white male in no acute distress Head: Normocephalic and atraumatic Eyes:  Sclerae anicteric, conjunctiva pink. Ears: Normal auditory acuity Lungs: Clear throughout to auscultation; no increased WOB. Heart: Regular rate and rhythm; no M/R/G. Abdomen: Soft, non-distended.  BS present.  Non-tender. Musculoskeletal: Symmetrical with no gross deformities  Skin: No lesions on visible extremities Extremities: No edema  Neurological: Alert oriented x 4, grossly non-focal Psychological:  Alert and cooperative. Normal mood and affect  ASSESSMENT AND PLAN: *Dysphagia and GERD:  Mostly to solid food.  He may very well have an esophageal stricture and may need dilation, but he  just had cardiac stents placed in 09/2016 and is on Plavix.  We will start with an esophagram first to better assess before deciding on any procedure.  Could be esophageal spasm/reflux related.  Will increase pantoprazole to 40 mg BID for now.   CC:  Josetta Huddle, MD

## 2017-04-11 ENCOUNTER — Ambulatory Visit (HOSPITAL_COMMUNITY)
Admission: RE | Admit: 2017-04-11 | Discharge: 2017-04-11 | Disposition: A | Discharge status: Home / Self Care | Payer: Medicare Other | Source: Ambulatory Visit | Attending: Internal Medicine | Admitting: Internal Medicine

## 2017-04-11 DIAGNOSIS — I2510 Atherosclerotic heart disease of native coronary artery without angina pectoris: Secondary | ICD-10-CM | POA: Insufficient documentation

## 2017-04-11 DIAGNOSIS — R911 Solitary pulmonary nodule: Secondary | ICD-10-CM | POA: Insufficient documentation

## 2017-04-11 DIAGNOSIS — I700 Atherosclerosis of aorta: Secondary | ICD-10-CM | POA: Diagnosis not present

## 2017-04-11 LAB — GLUCOSE, CAPILLARY: GLUCOSE-CAPILLARY: 210 mg/dL — AB (ref 65–99)

## 2017-04-11 MED ORDER — FLUDEOXYGLUCOSE F - 18 (FDG) INJECTION: 8.1000 | Freq: Once | INTRAVENOUS | Status: DC | PRN

## 2017-04-14 ENCOUNTER — Telehealth: Payer: Self-pay | Admitting: Internal Medicine

## 2017-04-14 NOTE — Telephone Encounter (Signed)
Brian Hudson  Let [name] know tht PET scan the nodule might be shrinking. So we will prbabaly just end up keeping an eye. So, have him keep up appt 04/29/17   Dr. Brand Males, M.D., [facility].C.P Pulmonary and Critical Care Medicine Staff Physician, [place] Director - Interstitial Lung Disease  Program  Pulmonary [place] at [place], [place], [address]  Pager: [phone], If no answer or between  15:00h - 7:00h: call [phone] Telephone: [phone]

## 2017-04-15 NOTE — Telephone Encounter (Signed)
Also let [name] know that  Antibody for wegner is negative but one is trace positive. So, given fact nodule is shrinking etc., we will jus keep eye  Results for NEVAN, CREIGHTON (MRN [id number]) as of 04/15/2017 17:58  Ref. Range 03/14/2017 08:57 03/24/2017 15:02 04/11/2017 08:43 04/11/2017 10:17  Sed Rate Latest Ref Range: 0 - 20 mm/hr  12    GBM Ab Latest Units: AI  <1.0    Myeloperoxidase Abs Latest Units: AI  <1.0    Serine Protease 3 Latest Units: AI  1.6 (H)      Dr. Brand Males, M.D., [facility].C.P Pulmonary and Critical Care Medicine Staff Physician, [place] Director - Interstitial Lung Disease  Program  Pulmonary [place] at [place], [place], [address]  Pager: [phone], If no answer or between  15:00h - 7:00h: call [phone] Telephone: [phone]

## 2017-04-15 NOTE — Telephone Encounter (Signed)
Left message for pt to call us back x1 

## 2017-04-16 NOTE — Telephone Encounter (Signed)
lmtcb X1 for pt  

## 2017-04-16 NOTE — Telephone Encounter (Signed)
Pt calling about results of recent PET Scan. Cb is [phone].

## 2017-04-16 NOTE — Telephone Encounter (Signed)
lmtcb for pt.  

## 2017-04-17 ENCOUNTER — Ambulatory Visit (HOSPITAL_COMMUNITY)
Admission: RE | Admit: 2017-04-17 | Discharge: 2017-04-17 | Disposition: A | Discharge status: Home / Self Care | Payer: Medicare Other | Source: Ambulatory Visit | Attending: Gastroenterology | Admitting: Gastroenterology

## 2017-04-17 DIAGNOSIS — K224 Dyskinesia of esophagus: Secondary | ICD-10-CM | POA: Diagnosis not present

## 2017-04-17 DIAGNOSIS — R1310 Dysphagia, unspecified: Secondary | ICD-10-CM | POA: Diagnosis present

## 2017-04-17 DIAGNOSIS — K449 Diaphragmatic hernia without obstruction or gangrene: Secondary | ICD-10-CM | POA: Insufficient documentation

## 2017-04-17 DIAGNOSIS — K219 Gastro-esophageal reflux disease without esophagitis: Secondary | ICD-10-CM | POA: Insufficient documentation

## 2017-04-24 NOTE — Telephone Encounter (Signed)
Called pt letting him know the results of his PET scan and his labwork. Told pt MR would go over everything in further detail with him at his OV 04/29/17. Pt expressed understanding. Nothing further needed at this time.

## 2017-04-29 ENCOUNTER — Ambulatory Visit: Payer: Medicare Other | Admitting: Internal Medicine

## 2017-04-29 ENCOUNTER — Telehealth: Payer: Self-pay | Admitting: Internal Medicine

## 2017-04-29 ENCOUNTER — Encounter: Payer: Self-pay | Admitting: Internal Medicine

## 2017-04-29 VITALS — BP 114/70 | HR 50 | Ht 69.0 in | Wt 165.4 lb

## 2017-04-29 DIAGNOSIS — J329 Chronic sinusitis, unspecified: Secondary | ICD-10-CM

## 2017-04-29 DIAGNOSIS — R0982 Postnasal drip: Secondary | ICD-10-CM

## 2017-04-29 DIAGNOSIS — R911 Solitary pulmonary nodule: Secondary | ICD-10-CM | POA: Diagnosis not present

## 2017-04-29 DIAGNOSIS — Z8679 Personal history of other diseases of the circulatory system: Secondary | ICD-10-CM | POA: Diagnosis not present

## 2017-04-29 DIAGNOSIS — R0600 Dyspnea, unspecified: Secondary | ICD-10-CM | POA: Diagnosis not present

## 2017-04-29 DIAGNOSIS — R0689 Other abnormalities of breathing: Secondary | ICD-10-CM

## 2017-04-29 NOTE — Patient Instructions (Signed)
History of Wegener's granulomatosis Chronic sinusitis, unspecified location   - doubt sinus issue is due to wegner. But we do not know why you have chronic sinus issue  - So, do Sinus CT without contrast  Nodule of left lung  - do not think this is due to wegner because wegner blood work dec 2018 and PET jan 2019 show is shrinking compared to Dec 2018 - do repeat ct chest wo contrast Dec 2019  Dyspnea and respiratory abnormalities  - unexplained despite continue symbicort and placement of cardiac stent  - do CPST bike test with EIB challenge with Landis Martins  Followup  1-2 months but after completing above

## 2017-04-29 NOTE — Progress Notes (Signed)
Subjective:     Patient ID: Brian Hudson, male   DOB: [birth year], 67 y.o.   MRN: [id number]  HPI PCP Josetta Huddle, MD   IOV 08/21/2016  Chief Complaint  Patient presents with  . Pulm Consult    Referred by Leafy Kindle PA-C for vasculitis.    67 year old male referred by Dr. Gavin Pound and of physician assistant for evaluation of shortness of breath in the setting of previous diagnosis of Wegner's. There is very little outside information but in terms of his Wegener's granulomatosis he tells me that he was diagnosed with this in 2004 following significant sinus symptoms including bloody sinus drainage. This was at [facility]. He was treated for approximately one year with Cytoxan and subsequently on maintenance methotrexate. His last treatment for Wegener's was in 2008. Since then he's been on observation therapy. Treatment Back then  included prednisone as well. Then approximately 2 or 3 years ago he switched care to Dr. Gavin Pound and rheumatology. According to him his been on observation therapy there as well. He tells me now for the last year or 2 he's been more fatigue associated with shortness of breath. He used to walk 2 miles a day but now is unable to do that. Dyspnea is present all the time but definitely more so with exertion and relieved by rest. There is associated cough and sometimes hemoptysis especially early in the morning but this is been ongoing for several years. He thinks his Wegener's might be active. He believes Dr. Gavin Pound is done some blood work to look for vasculitis activity but I do not have those results with me. A CT scan of the chest was done that showed left upper lobe groundglass opacity and therefore he is been here. He denies any chest pain     FeNO 34ppb 08/21/2016 and slightly high  Walking desaturation test on 08/21/2016 185 feet x 3 laps on RA:  did NOT desaturate. Rest pulse ox was 100%, final pulse ox was 90. HR response 50/min at  rest to 62/min at peak exertion.    IMPRESSION: CT scan of the chest personally visualized 1. No acute abnormality is seen on CT of the chest. 2. Coronary artery calcifications. 3. Minimal ground-glass opacity in the left upper lobe of doubtful significance. Initial follow-up with CT at 6-12 months is recommended to confirm persistence. If persistent, repeat CT is recommended every 2 years until 5 years of stability has been established. This recommendation follows the consensus statement: Guidelines for Management of Incidental Pulmonary Nodules Detected on CT Images: From the Fleischner Society 2017; Radiology 2017; 284:228-243.   Electronically Signed   By: Ivar Drape M.D.   On: 06/11/2016 17:14   09/11/2016 PFT' Follow Up: Brian Hudson is a 67 y.o. male former smoker quit in 1994 with history of Wegner's Disease since 2004, and dyspnea on exertion.  Pt. Was seen by [name] 08/21/2016 for  dyspnea .Plan at that time was as follows: PLAN  - start symbicort 2 puff twice daily - take sample worth 1 month  - do ono test room air next few to several days  - refer cardiology  - [name] or [facility] first available - sign record releast to get more info on wegner from [name] esp blood work  - do full PFT  Patient presents today for follow-up. Patient has been compliant with Symbicort since 08/21/2016. He states he cannot tell any significant difference. In his dyspnea. He states  he continues to get short of breath at times with exercise and also when talking. He states his shortness of breath does not wake him from sleep. He continues to be unable to walk as he has done in the past. He was seen by [name] at [facility] heart care on 09/06/2016. Of note there is a significant family history which includes coronary artery disease in his father, who had MI at the age of 95.Risk factors include CAD, DM, HLD, + FH premature CAD, hx tobacco use and coronary calcifications on CT of  chest.The patient is Scheduled for an exercise stress test 6/14 and an event monitor 6/14 to rule out tachy bradycardia syndrome. He states he does have secretions in the morning, yellowish with occasional blood tinged secretions, that self resolves..Lab work has not been sent from Dr. Trudie Reed In [place], therefore I am unable to review. Patient denies any fever, chest pain, orthopnea, or hemoptysis.   Test Results:  CT angiogram: 06/11/2016 No acute abnormality is seen on CT of the chest. 2. Coronary artery calcifications. 3. Minimal ground-glass opacity in the left upper lobe of doubtful significance. Initial follow-up with CT at 6-12 months is recommended to confirm persistence  Ambulatory saturation: 08/21/2016 185 feet 3 laps on room air. Did not desaturate. Resting pulse ox was 100%. Final pulse ox was 90%. Heart rate response 50/m at rest is 62/m at peak exertion.  EKG 09/06/2016 Sinus bradycardia with poor R-wave progression, heart rate 44   OV 03/24/2017 Chief Complaint  Patient presents with  . Follow-up    CT scan 03/14/17.  Pt states that he still has some tightness in chest, has occ coughing, and is SOB all the time.  Pt also states that he has gotten choked several times while trying to eat.   Remote Wegner's granulomatosis in 2004 status post therapy. Remote smoking history  Follow-up shortness of breath: eing seen in pulmonary clinic for shortness of breath. Earlier this year we referred him to cardiology on basis of coronary artery calcification and strong family history. He status post coronary artery stent 2. After that his cardiac rehabilitation. Dyspnea is only some better he still has residual dyspnea  Follow-up left upper lobe lung nodule is a groundglass opacity. He had follow-up CT scan of the chest without contrast 03/14/2017. This is documented below. It appears the ground glass opacity persists but it is also "close to 2 cm. However he says that his [name]  is under remission. He did see Dr. Trudie Reed earlier this year and apparently blood work was normal.  New issue: Is complaining of nonspecific dysphagia 4 times in the last 2 weeks. It is for solids. He is pretty significant. No associated vomiting or aspiration. His sister confirms the same.  IMPRESSION: ct chest 03/14/17 Persistent 12 x 19 mm ground-glass opacity in the central left upper Lobe. Adenocarcinoma cannot be excluded. Follow up by CT is recommended in 12 months, with continued annual surveillance for a minimum of 3 Years. These recommendations are taken from: Recommendations for the Management of Subsolid Pulmonary Nodules Detected at CT: A Statement from the [place] [facility] 2013; 266:1, [phone].  Aortic Atherosclerosis (ICD10-I70.0).   Electronically Signed   By: Julian Hy M.D.   On: 03/14/2017 09:16   OV 04/29/2017  Chief Complaint  Patient presents with  . Follow-up    PET scan done 04/11/17.  Pt has complaints of coughing (non-productive), shortness of breath     Follow-up shortness of breath in this  patient with remote [place] history and remote smoking history Follow-up left lower lobe lung nodule  In terms of shortness of breath: He is compliant with his Symbicort for empiric asthma treatment.  He had coronary artery stents in the middle of 2018.  Despite this he is short of breath.  He says this frustrates him significantly.  He tells me that he is able to walk a block or climb steps without a problem or less of a problem.  And otherwise he does not been otherwise he does have some dyspnea on exertion for these activities.  But the significant amount of dyspnea happens when he is talking a lot.  He feels this is Wegener's dj vu again.  He did have Wegener's serology and did see Dr. Trudie Reed in rheumatology end of 2018 and there is no evidence of Wegener's recurrence but he still feels this is white not measurable.  He has an appointment upcoming  with Dr. Candee Furbish his cardiologist.  In terms of left lower lobe lung nodule groundglass opacity: This was noted December 2018 CT chest.  He had a follow-up CT PET in January 2019 that showed this nodule was beginning to shrink.  His Wegner antibodies were negative/trace positive.  He has been reassured.  He requires a follow-up CT scan end of 2019.  New issue: Is complaining of chronic sinus discharge for months.  He feels is all Wegener's again.  I have repeatedly told him that so far we do not have evidence of recurrence.    has a past medical history of Anxiety, CAD (coronary artery disease), Concussion, DM (diabetes mellitus) ([place]), ED (erectile dysfunction), Fracture (2010), GERD (gastroesophageal reflux disease), HTN (hypertension), Hyperlipidemia, IBS (irritable bowel syndrome), Palpitations, Prostate cancer ([place]) (2007), Sigmoid diverticulosis, and Wegener's granulomatosis ([place]).   reports that he quit smoking about 26 years ago. His smoking use included cigarettes. He has a 105.00 pack-year smoking history. he has never used smokeless tobacco.  Past Surgical History:  Procedure Laterality Date  . COLONOSCOPY N/A 06/24/2016   Procedure: COLONOSCOPY with propofol;  Surgeon: Garlan Fair, MD;  [facility];  Service: Endoscopy;  Laterality: N/A;  . colonscopy  2008  . CORONARY STENT INTERVENTION N/A 09/20/2016   Procedure: Coronary Stent Intervention;  Surgeon: Leonie Man, MD;  Location: [facility];  Service: Cardiovascular;  Laterality: N/A;  . INSERTION PROSTATE RADIATION SEED  2007  . INTRAVASCULAR PRESSURE WIRE/FFR STUDY N/A 09/20/2016   Procedure: Intravascular Pressure Wire/FFR Study;  Surgeon: Leonie Man, MD;  Location: [facility];  Service: Cardiovascular;  Laterality: N/A;  . LEFT HEART CATH AND CORONARY ANGIOGRAPHY N/A 09/20/2016   Procedure: Left Heart Cath and Coronary Angiography;  Surgeon: Leonie Man, MD;  Location: [facility]  [facility] LAB;  Service: Cardiovascular;  Laterality: N/A;  . ROTATOR CUFF REPAIR Right     Allergies  Allergen Reactions  . Codeine Other (See Comments)    "jittery"  . Lipitor [Atorvastatin] Other (See Comments)    arthralgia  . Zetia [Ezetimibe] Other (See Comments)    arthralgia  . Zocor [Simvastatin] Other (See Comments)    arthralgia    Immunization History  Administered Date(s) Administered  . Influenza, High Dose Seasonal PF 01/06/2017  . Pneumococcal Polysaccharide-23 02/19/2012    Family History  Problem Relation Age of Onset  . Heart attack Father 15  . CAD Father   . Diabetic kidney disease Mother   . Healthy Sister   .  Cancer Brother   . Healthy Sister      Current Outpatient Medications:  .  acetaminophen (TYLENOL) 325 MG tablet, Take 650 mg by mouth every morning. , Disp: , Rfl:  .  amLODipine (NORVASC) 10 MG tablet, Take 10 mg by mouth daily., Disp: , Rfl: 3 .  aspirin EC 81 MG tablet, Take 1 tablet (81 mg total) by mouth daily., Disp: 30 tablet, Rfl: 0 .  budesonide-formoterol (SYMBICORT) 160-4.5 MCG/ACT inhaler, Inhale 2 puffs into the lungs 2 (two) times daily., Disp: 1 Inhaler, Rfl: 5 .  clopidogrel (PLAVIX) 75 MG tablet, Take 1 tablet (75 mg total) by mouth daily., Disp: 90 tablet, Rfl: 3 .  escitalopram (LEXAPRO) 10 MG tablet, Take 10 mg by mouth daily., Disp: , Rfl:  .  hydrocortisone cream 1 %, Apply 1 application topically daily as needed for itching., Disp: , Rfl:  .  metFORMIN (GLUCOPHAGE-XR) 500 MG 24 hr tablet, Take 500 mg by mouth 2 (two) times daily after a meal. , Disp: , Rfl:  .  Multiple Vitamin (MULTIVITAMIN WITH MINERALS) TABS tablet, Take 1 tablet by mouth daily., Disp: , Rfl:  .  nitroGLYCERIN (NITROSTAT) 0.4 MG SL tablet, Place 1 tablet (0.4 mg total) under the tongue every 5 (five) minutes as needed for chest pain., Disp: 28 tablet, Rfl: 3 .  pantoprazole (PROTONIX) 40 MG tablet, Take 1 tablet (40 mg total) by mouth 2 (two) times daily.,  Disp: 60 tablet, Rfl: 2 .  rosuvastatin (CRESTOR) 5 MG tablet, Take 5 mg by mouth daily., Disp: , Rfl: 3 .  sodium chloride (OCEAN) 0.65 % SOLN nasal spray, Place 1 spray into both nostrils as needed for congestion., Disp: , Rfl:  .  amLODipine (NORVASC) 10 MG tablet, Take 1 tablet (10 mg total) by mouth daily., Disp: 180 tablet, Rfl: 3 .  rosuvastatin (CRESTOR) 5 MG tablet, Take 1 tablet (5 mg total) by mouth daily., Disp: 90 tablet, Rfl: 3   Review of Systems     Objective:   Physical Exam  Constitutional: He is oriented to person, place, and time. He appears well-developed and well-nourished. No distress.  HENT:  Head: Normocephalic and atraumatic.  Right Ear: External ear normal.  Left Ear: External ear normal.  Mouth/Throat: Oropharynx is clear and moist. No oropharyngeal exudate.  Eyes: Conjunctivae and EOM are normal. Pupils are equal, round, and reactive to light. Right eye exhibits no discharge. Left eye exhibits no discharge. No scleral icterus.  Neck: Normal range of motion. Neck supple. No JVD present. No tracheal deviation present. No thyromegaly present.  Cardiovascular: Normal rate, regular rhythm and intact distal pulses. Exam reveals no gallop and no friction rub.  No murmur heard. Pulmonary/Chest: Effort normal and breath sounds normal. No respiratory distress. He has no wheezes. He has no rales. He exhibits no tenderness.  Abdominal: Soft. Bowel sounds are normal. He exhibits no distension and no mass. There is no tenderness. There is no rebound and no guarding.  Musculoskeletal: Normal range of motion. He exhibits no edema or tenderness.  Lymphadenopathy:    He has no cervical adenopathy.  Neurological: He is alert and oriented to person, place, and time. He has normal reflexes. No cranial nerve deficit. Coordination normal.  Skin: Skin is warm and dry. No rash noted. He is not diaphoretic. No erythema. No pallor.  Psychiatric: He has a normal mood and affect. His  behavior is normal. Judgment and thought content normal.  Nursing note and vitals reviewed.  Vitals:  04/29/17 1042  BP: 114/70  Pulse: (!) 50  SpO2: 99%  Weight: 165 lb 6.4 oz (75 kg)  Height: 5\' 9"  (1.753 m)    Estimated body mass index is 24.43 kg/m as calculated from the following:   Height as of this encounter: 5\' 9"  (1.753 m).   Weight as of this encounter: 165 lb 6.4 oz (75 kg).     Assessment:       ICD-10-CM   1. History of Wegener's granulomatosis Z86.79   2. Chronic sinusitis, unspecified location J32.9   3. Nodule of left lung R91.1   4. Dyspnea and respiratory abnormalities R06.00    R06.89        Plan:     History of Wegener's granulomatosis Chronic sinusitis, unspecified location   - doubt sinus issue is due to wegner. But we do not know why you have chronic sinus issue  - So, do Sinus CT without contrast  Nodule of left lung  - do not think this is due to wegner because wegner blood work dec 2018 and PET jan 2019 show is shrinking compared to Dec 2018 - do repeat ct chest wo contrast Dec 2019  Dyspnea and respiratory abnormalities  - unexplained despite continue symbicort and placement of cardiac stent  - do CPST bike test with EIB challenge with Landis Martins  Followup  1-2 months but after completing above    > 50% of this > 25 min visit spent in face to face counseling or coordination of care    Dr. Brand Males, M.D., [facility].C.P Pulmonary and Critical Care Medicine Staff Physician, [place] Director - Interstitial Lung Disease  Program  Pulmonary [place] at [place], [place], [address]  Pager: [phone], If no answer or between  15:00h - 7:00h: call [phone] Telephone: [phone]

## 2017-04-29 NOTE — Telephone Encounter (Signed)
Ok to perform CPST bike test Candee Furbish, MD

## 2017-04-29 NOTE — Telephone Encounter (Signed)
Brian  Rubie Hudson is s.p stent. [name] for me to do CPST biket test? He still dyspneic  THanks  Dr. Brand Males, M.D., [facility].C.P Pulmonary and Critical Care Medicine Staff Physician, [place] Director - Interstitial Lung Disease  Program  Pulmonary [place] at [place], [place], [address]  Pager: [phone], If no answer or between  15:00h - 7:00h: call [phone] Telephone: [phone]

## 2017-04-29 NOTE — Telephone Encounter (Signed)
MR please advise- I don't know who Elta Guadeloupe is.

## 2017-04-30 NOTE — Telephone Encounter (Signed)
Thanks [employer]. Wil close note. Reply not needed  Dr. Brand Males, M.D., [facility].C.P Pulmonary and Critical Care Medicine Staff Physician, [place] Director - Interstitial Lung Disease  Program  Pulmonary [place] at [place], [place], [address]  Pager: [phone], If no answer or between  15:00h - 7:00h: call [phone] Telephone: [phone]

## 2017-05-06 ENCOUNTER — Ambulatory Visit: Payer: Medicare Other | Admitting: Cardiology

## 2017-05-06 ENCOUNTER — Encounter: Payer: Self-pay | Admitting: Cardiology

## 2017-05-06 VITALS — BP 112/50 | HR 58 | Ht 69.0 in | Wt 165.5 lb

## 2017-05-06 DIAGNOSIS — Z955 Presence of coronary angioplasty implant and graft: Secondary | ICD-10-CM

## 2017-05-06 DIAGNOSIS — I10 Essential (primary) hypertension: Secondary | ICD-10-CM

## 2017-05-06 DIAGNOSIS — I2510 Atherosclerotic heart disease of native coronary artery without angina pectoris: Secondary | ICD-10-CM

## 2017-05-06 DIAGNOSIS — I2584 Coronary atherosclerosis due to calcified coronary lesion: Secondary | ICD-10-CM | POA: Diagnosis not present

## 2017-05-06 DIAGNOSIS — I25119 Atherosclerotic heart disease of native coronary artery with unspecified angina pectoris: Secondary | ICD-10-CM | POA: Diagnosis not present

## 2017-05-06 DIAGNOSIS — Z9861 Coronary angioplasty status: Secondary | ICD-10-CM

## 2017-05-06 MED ORDER — AMLODIPINE BESYLATE 5 MG PO TABS: 5.0000 mg | ORAL_TABLET | Freq: Every day | ORAL | 3 refills | Status: DC

## 2017-05-06 NOTE — Patient Instructions (Addendum)
Medication Instructions:  Please decrease your Amlodipine to 5 mg a day. Continue all other medications as listed.  Labwork: None  Testing/Procedures: None  Follow-Up: Follow up in 6 months with Dr. Marlou Porch.  You will receive a letter in the mail 2 months before you are due.  Please call us when you receive this letter to schedule your follow up appointment.  If you need a refill on your cardiac medications before your next appointment, please call your pharmacy.  Thank you for choosing [facility]!!

## 2017-05-06 NOTE — Progress Notes (Signed)
Cardiology Office Note:    Date:  05/06/2017   ID:  [name], DOB [birth year], MRN [id number]  PCP:  Josetta Huddle, MD  Cardiologist:  Candee Furbish, MD    Referring MD: Josetta Huddle, MD     History of Present Illness:    ULYSSES ALPER is a 67 y.o. male with diabetes who underwent nuclear stress test at the request of [name], NP that demonstrated a markedly abnormal exercise treadmill. Marked ST segment depression, 2 mm diffuse. AVR elevation. His nuclear imaging was normal however. EF 53%. No perfusion defects. No transient ischemic dilatation.  Because of these findings, he ended up having a cardiac catheterization which did reveal significant CAD and resulted in DES to proximal left circumflex, and Cutting Balloon distal OM.  The stress test was performed because of coronary calcification seen on CT scan. [name] also had seen him and noted that he did not desaturate. He was concerned about possible chronotropic incompetence. His heart rate at baseline was in the 40s.  His father had heart attack at age 33. Coronary calcifications are noted in the LAD and circumflex arteries.  05/06/17  - some pressure with activity. Laying on left side or right side some pain. Right leg rash started a month ago. Debbie sister here. Now taking 2x PPI.   Past Medical History:  Diagnosis Date  . Anxiety   . CAD (coronary artery disease)    a. 09/20/16: 45% OM, 99% ostial ramus s/p DES, 80% proximal LCx s/p DES, 80% more distal LCx on AV groove portion of the circumflex after OM1 s/p cutting balloon and PCTA.   Marland Kitchen Concussion   . DM (diabetes mellitus) ([place])    type 2  . ED (erectile dysfunction)   . Fracture 2010   L 3 and L 4 healed with brace  . GERD (gastroesophageal reflux disease)   . HTN (hypertension)   . Hyperlipidemia   . IBS (irritable bowel syndrome)   . Palpitations    none in last few yrs  . Prostate cancer ([place]) 2007  . Sigmoid diverticulosis   . Wegener's  granulomatosis ([place])    followed by dr Berna Bue    Past Surgical History:  Procedure Laterality Date  . COLONOSCOPY N/A 06/24/2016   Procedure: COLONOSCOPY with propofol;  Surgeon: Garlan Fair, MD;  Location: [facility];  Service: Endoscopy;  Laterality: N/A;  . colonscopy  2008  . CORONARY STENT INTERVENTION N/A 09/20/2016   Procedure: Coronary Stent Intervention;  Surgeon: Leonie Man, MD;  Location: [facility];  Service: Cardiovascular;  Laterality: N/A;  . INSERTION PROSTATE RADIATION SEED  2007  . INTRAVASCULAR PRESSURE WIRE/FFR STUDY N/A 09/20/2016   Procedure: Intravascular Pressure Wire/FFR Study;  Surgeon: Leonie Man, MD;  Location: [facility];  Service: Cardiovascular;  Laterality: N/A;  . LEFT HEART CATH AND CORONARY ANGIOGRAPHY N/A 09/20/2016   Procedure: Left Heart Cath and Coronary Angiography;  Surgeon: Leonie Man, MD;  Location: [facility];  Service: Cardiovascular;  Laterality: N/A;  . ROTATOR CUFF REPAIR Right     Current Medications: Current Meds  Medication Sig  . acetaminophen (TYLENOL) 325 MG tablet Take 650 mg by mouth every morning.   Marland Kitchen aspirin EC 81 MG tablet Take 1 tablet (81 mg total) by mouth daily.  . budesonide-formoterol (SYMBICORT) 160-4.5 MCG/ACT inhaler Inhale 2 puffs into the lungs 2 (two) times daily.  . clopidogrel (PLAVIX) 75 MG tablet Take 1  tablet (75 mg total) by mouth daily.  Marland Kitchen escitalopram (LEXAPRO) 10 MG tablet Take 10 mg by mouth daily.  . hydrocortisone cream 1 % Apply 1 application topically daily as needed for itching.  . metFORMIN (GLUCOPHAGE-XR) 500 MG 24 hr tablet Take 500 mg by mouth 2 (two) times daily after a meal.   . Multiple Vitamin (MULTIVITAMIN WITH MINERALS) TABS tablet Take 1 tablet by mouth daily.  . nitroGLYCERIN (NITROSTAT) 0.4 MG SL tablet Place 1 tablet (0.4 mg total) under the tongue every 5 (five) minutes as needed for chest pain.  . pantoprazole (PROTONIX) 40 MG tablet  Take 1 tablet (40 mg total) by mouth 2 (two) times daily.  . rosuvastatin (CRESTOR) 5 MG tablet Take 5 mg by mouth daily.  . sodium chloride (OCEAN) 0.65 % SOLN nasal spray Place 1 spray into both nostrils as needed for congestion.     Allergies:   Codeine; Lipitor [atorvastatin]; Zetia [ezetimibe]; and Zocor [simvastatin]   Social History   Socioeconomic History  . Marital status: Divorced    Spouse name: None  . Number of children: None  . Years of education: None  . Highest education level: None  Social Needs  . Financial resource strain: None  . Food insecurity - worry: None  . Food insecurity - inability: None  . Transportation needs - medical: None  . Transportation needs - non-medical: None  Occupational History  . Occupation: Chief Strategy Officer  Tobacco Use  . Smoking status: Former Smoker    Packs/day: 3.00    Years: 35.00    Pack years: 105.00    Types: Cigarettes    Last attempt to quit: 04/09/1991    Years since quitting: 26.0  . Smokeless tobacco: Never Used  Substance and Sexual Activity  . Alcohol use: No    Alcohol/week: 0.0 oz  . Drug use: Yes    Types: Marijuana    Comment:   marijuana last used 2- weeks ago as of 06-20-16  . Sexual activity: None  Other Topics Concern  . None  Social History Narrative  . None     Family History: The patient's family history includes CAD in his father; Cancer in his brother; Diabetic kidney disease in his mother; Healthy in his sister and sister; Heart attack (age of onset: 53) in his father. ROS:   Please see the history of present illness.     All other systems reviewed and are negative.  EKGs/Labs/Other Studies Reviewed:    The following studies were reviewed today: Nuclear stress test as described above. Markedly abnormal ETT with ST segment depression. Perfusion images are normal.  Cath 7/82/42:  LV end diastolic pressure is normal.  1st Mrg lesion, 45 %stenosed.  Ost Ramus lesion, 99 %stenosed. Vessel is less  than 1.5 mm  Culprit lesion: Bifurcation Cx-OM1 (1,1,1) lesion  A STENT SYNERGY DES 2.25X12 drug eluting stent was successfully placed.  Prox Cx-1 lesion, 80 %stenosed involving Ost 1st Mrg lesion, 80 %stenosed. ([place] 1,1,1)  A STENT SYNERGY DES 2.25X12 drug eluting stent was successfully placed crossing from the main Cx into OM1. Postdilated to 2.5 mm  Post intervention, there is a 0% residual stenosis.  Prox Cx-2 lesion, 80 %stenosed in the follow on AV groove portion of the circumflex after OM1. The bifurcation lesion.  Post Cutting Balloon then post PCI of OM PTCA intervention, there is a 10% residual stenosis.   Successful bifurcation PCI/PTCA of the circumflex into OM1 and into the AV groove circumflex for PTCA.  OM1 is a larger vessel than the follow on AV groove circumflex proximal therefore decided to stent into the OM and PTCA the ostium.  Mild to moderate disease elsewhere. Normal LVEDP.  Plan:  Patient will need to be on aspirin plus Plavix for at least 6 months. I used a Synergy stent in order to allow for potentially safe stopping of the Plavix if necessary for Wegener's-related bleeding.  Patient was transferred back to the short stay holding area for TR band removal. If stable after bedrest, he would be ready for discharge today as per same day discharge planning.  Defer further management to Dr. Marlou Porch.  The patient is intolerant of statins, and is bradycardic at baseline precluding beta blocker use.    Glenetta Hew, M.D., M.S.  EKG:  EKG is not ordered today. Baseline ECG from exercise treadmill test shows sinus rhythm heart rate 49. Personally viewed.  Recent Labs: 09/06/2016: ALT 20; TSH 0.873 09/19/2016: BUN 13; Creatinine, Ser 1.14; Hemoglobin 13.9; Platelets 144; Potassium 4.4; Sodium 135   Recent Lipid Panel    Component Value Date/Time   CHOL 185 09/06/2016 0912   TRIG 89 09/06/2016 0912   HDL 33 (L) 09/06/2016 0912   CHOLHDL 5.6 (H) 09/06/2016  0912   LDLCALC 134 (H) 09/06/2016 0912    Physical Exam:    VS:  BP (!) 112/50   Pulse (!) 58   Ht 5\' 9"  (1.753 m)   Wt 165 lb 8 oz (75.1 kg)   BMI 24.44 kg/m     Wt Readings from Last 3 Encounters:  05/06/17 165 lb 8 oz (75.1 kg)  04/29/17 165 lb 6.4 oz (75 kg)  04/10/17 165 lb 4 oz (75 kg)     GEN: Well nourished, well developed, in no acute distress  HEENT: normal  Neck: no JVD, carotid bruits, or masses Cardiac: RRR; no murmurs, rubs, or gallops,no edema  Respiratory:  clear to auscultation bilaterally, normal work of breathing GI: soft, nontender, nondistended, + BS MS: no deformity or atrophy  Skin: warm and dry, right lower leg rash Neuro:  Alert and Oriented x 3, Strength and sensation are intact Psych: euthymic mood, full affect   ASSESSMENT:    1. Coronary artery disease with angina pectoris, unspecified vessel or lesion type, unspecified whether native or transplanted heart ([place])   2. Status post coronary artery stent placement   3. S/P PTCA (percutaneous transluminal coronary angioplasty)   4. Essential hypertension   5. Coronary artery calcification    PLAN:    In order of problems listed above:  Coronary artery disease status post circumflex stent -June 2018.  We will go ahead and continue with his dual antiplatelet therapy for at least one year.  At next visit we will likely stop his Plavix.  If bleeding occurs, can stop the Plavix now.  He is having occasional small episodes of possible anginal symptoms.  We will keep an eye on this.  It is fine for him to take nitroglycerin if necessary.  His symptom of chest discomfort when laying on his right or left side and not on his back is not likely cardiac related.  He is also battling GERD but the doubling up of Protonix seems to have helped.  Markedly abnormal exercise treadmill test/stress test  - With LAD and circumflex calcification, concerning for multivessel coronary artery disease with marketed 2 mm ST  segment depression diffusely with aVR elevation. He was significant short of breath, turned pale. His blood pressure  did elevate appropriately. He exercised for a total of 9 minutes 46 seconds but had to be held up at the end.  - His perfusion images are normal and there is no dilatation and EF is 53%.  This resulted in cardiac catheterization and circumflex stent.  See above for details.  -I am fine with him at this point undergoing a cardiopulmonary stress test with [name].  Coronary artery calcification  - LAD and circumflex, continue with Crestor  Diabetes  -Aggressive prevention.  Edema with rash  - amlodipine cut from 10 to 5. May help. With BP so low may be able to come off.   He had normal chronotropic competence after exercise treadmill test.  History of Wegener's - [name] doubts that his sinus issue was due to [facility].  Do not know why he has a chronic sinus issue however.  He also has a nodule of left lung which is not think his Wegener's.  Following with CT.  Dyspnea and respiratory issues will be challenged with bicycle stress test.  Medication Adjustments/Labs and Tests Ordered: Current medicines are reviewed at length with the patient today.  Concerns regarding medicines are outlined above. Labs and tests ordered and medication changes are outlined in the patient instructions below:  Patient Instructions  Medication Instructions:  Please decrease your Amlodipine to 5 mg a day. Continue all other medications as listed.  Labwork: None  Testing/Procedures: None  Follow-Up: Follow up in 1 year with Dr. Marlou Porch.  You will receive a letter in the mail 2 months before you are due.  Please call us when you receive this letter to schedule your follow up appointment.  If you need a refill on your cardiac medications before your next appointment, please call your pharmacy.  Thank you for choosing [facility]!!        Signed, Candee Furbish, MD    05/06/2017 8:47 AM    [place]

## 2017-05-12 ENCOUNTER — Ambulatory Visit (HOSPITAL_COMMUNITY): Payer: Medicare Other | Attending: Internal Medicine

## 2017-05-12 ENCOUNTER — Ambulatory Visit (INDEPENDENT_AMBULATORY_CARE_PROVIDER_SITE_OTHER)
Admission: RE | Admit: 2017-05-12 | Discharge: 2017-05-12 | Disposition: A | Discharge status: Home / Self Care | Payer: Medicare Other | Source: Ambulatory Visit | Attending: Internal Medicine | Admitting: Internal Medicine

## 2017-05-12 DIAGNOSIS — R0689 Other abnormalities of breathing: Secondary | ICD-10-CM

## 2017-05-12 DIAGNOSIS — R0600 Dyspnea, unspecified: Secondary | ICD-10-CM | POA: Diagnosis not present

## 2017-05-12 DIAGNOSIS — J329 Chronic sinusitis, unspecified: Secondary | ICD-10-CM | POA: Diagnosis not present

## 2017-05-13 ENCOUNTER — Telehealth: Payer: Self-pay | Admitting: Internal Medicine

## 2017-05-13 NOTE — Telephone Encounter (Signed)
HI Brian Hudson  Will you be able to see Brian Hudson sometime - he c/o dyspnea. CPST shows circulatory/cardiac issues. -> "Resting ECG in sinus bradycardia. HR response blunted. There were no sustained arrhythmias, however ST depression was present during final few stages of higher intensity exercise and at peak. This normalized with active to passive recovery. Patient was increasingly hypertensive, particularly during peak exercise with normalization during active to passive recovery"  Thanks  Dr. Brand Males, M.D., [facility].C.P Pulmonary and Critical Care Medicine Staff Physician, [place] Director - Interstitial Lung Disease  Program  Pulmonary [place] at [place], [place], [address]  Pager: [phone], If no answer or between  15:00h - 7:00h: call [phone] Telephone: [phone]   .

## 2017-05-13 NOTE — Telephone Encounter (Signed)
Sister called back (on [place]) requesting appt be moved up to 05/15/2017 as initially offered.  appt rescheduled as requested.

## 2017-05-13 NOTE — Telephone Encounter (Signed)
Have him come in to discuss cardiac cath based upon continued symptoms and continued ST abnormalities on CPST.  Thanks Candee Furbish, MD

## 2017-05-13 NOTE — Telephone Encounter (Signed)
Follow up    Patients sister Neoma Laming is calling back on behalf of his brother.

## 2017-05-13 NOTE — Telephone Encounter (Signed)
Spoke with pt RE: recommendations based on recent results.  Pt is scheduled with Dr Marlou Porch to discuss cardiac cath 2/19 at 8 am.  He was not able to come in prior to then.

## 2017-05-14 DIAGNOSIS — N183 Chronic kidney disease, stage 3 (moderate): Secondary | ICD-10-CM | POA: Diagnosis not present

## 2017-05-15 ENCOUNTER — Ambulatory Visit: Payer: Medicare Other | Admitting: Cardiology

## 2017-05-15 ENCOUNTER — Encounter: Payer: Self-pay | Admitting: Cardiology

## 2017-05-15 VITALS — BP 132/66 | HR 57 | Ht 69.0 in | Wt 168.2 lb

## 2017-05-15 DIAGNOSIS — R0609 Other forms of dyspnea: Secondary | ICD-10-CM | POA: Diagnosis not present

## 2017-05-15 DIAGNOSIS — I25119 Atherosclerotic heart disease of native coronary artery with unspecified angina pectoris: Secondary | ICD-10-CM | POA: Diagnosis not present

## 2017-05-15 DIAGNOSIS — R0600 Dyspnea, unspecified: Secondary | ICD-10-CM

## 2017-05-15 DIAGNOSIS — Z01818 Encounter for other preprocedural examination: Secondary | ICD-10-CM

## 2017-05-15 DIAGNOSIS — I209 Angina pectoris, unspecified: Secondary | ICD-10-CM | POA: Diagnosis not present

## 2017-05-15 NOTE — Progress Notes (Signed)
Cardiology Office Note:    Date:  05/15/2017   ID:  Brian Hudson, DOB [birth year], MRN [id number]  PCP:  Josetta Huddle, MD  Cardiologist:  Candee Furbish, MD    Referring MD: Josetta Huddle, MD     History of Present Illness:    Brian Hudson is a 67 y.o. male with diabetes who underwent nuclear stress test at the request of [name], NP that demonstrated a markedly abnormal exercise treadmill. Marked ST segment depression, 2 mm diffuse. AVR elevation. His nuclear imaging was normal however. EF 53%. No perfusion defects. No transient ischemic dilatation.  Because of these findings, he ended up having a cardiac catheterization which did reveal significant CAD and resulted in DES to proximal left circumflex, and Cutting Balloon distal OM.  The stress test was performed because of coronary calcification seen on CT scan. [name] also had seen him and noted that he did not desaturate. He was concerned about possible chronotropic incompetence. His heart rate at baseline was in the 40s.  His father had heart attack at age 6. Coronary calcifications are noted in the LAD and circumflex arteries.  05/06/17  - some pressure with activity. Laying on left side or right side some pain. Right leg rash started a month ago. Debbie sister here. Now taking 2x PPI.   05/15/17 -He saw [name] who performed a cardiopulmonary function study and this demonstrated ST segment depression once again on the ECG portion with some difficulty getting his heart rate increased.  Could be medication related.  With his ongoing dyspnea symptoms, we have decided to pursue a cardiac catheterization.  Past Medical History:  Diagnosis Date  . Anxiety   . CAD (coronary artery disease)    a. 09/20/16: 45% OM, 99% ostial ramus s/p DES, 80% proximal LCx s/p DES, 80% more distal LCx on AV groove portion of the circumflex after OM1 s/p cutting balloon and PCTA.   Marland Kitchen Concussion   . DM (diabetes mellitus) ([place])    type 2  .  ED (erectile dysfunction)   . Fracture 2010   L 3 and L 4 healed with brace  . GERD (gastroesophageal reflux disease)   . HTN (hypertension)   . Hyperlipidemia   . IBS (irritable bowel syndrome)   . Palpitations    none in last few yrs  . Prostate cancer ([place]) 2007  . Sigmoid diverticulosis   . Wegener's granulomatosis ([place])    followed by dr Berna Bue    Past Surgical History:  Procedure Laterality Date  . COLONOSCOPY N/A 06/24/2016   Procedure: COLONOSCOPY with propofol;  Surgeon: Garlan Fair, MD;  Location: [facility];  Service: Endoscopy;  Laterality: N/A;  . colonscopy  2008  . CORONARY STENT INTERVENTION N/A 09/20/2016   Procedure: Coronary Stent Intervention;  Surgeon: Leonie Man, MD;  Location: [place] [facility];  Service: Cardiovascular;  Laterality: N/A;  . INSERTION PROSTATE RADIATION SEED  2007  . INTRAVASCULAR PRESSURE WIRE/FFR STUDY N/A 09/20/2016   Procedure: Intravascular Pressure Wire/FFR Study;  Surgeon: Leonie Man, MD;  Location: [place] [facility];  Service: Cardiovascular;  Laterality: N/A;  . LEFT HEART CATH AND CORONARY ANGIOGRAPHY N/A 09/20/2016   Procedure: Left Heart Cath and Coronary Angiography;  Surgeon: Leonie Man, MD;  Location: [place] [facility];  Service: Cardiovascular;  Laterality: N/A;  . ROTATOR CUFF REPAIR Right     Current Medications: Current Meds  Medication Sig  . acetaminophen (TYLENOL) 325 MG  tablet Take 650 mg by mouth every morning.   Marland Kitchen amLODipine (NORVASC) 5 MG tablet Take 1 tablet (5 mg total) by mouth daily.  Marland Kitchen aspirin EC 81 MG tablet Take 1 tablet (81 mg total) by mouth daily.  . budesonide-formoterol (SYMBICORT) 160-4.5 MCG/ACT inhaler Inhale 2 puffs into the lungs 2 (two) times daily.  . clopidogrel (PLAVIX) 75 MG tablet Take 1 tablet (75 mg total) by mouth daily.  Marland Kitchen escitalopram (LEXAPRO) 10 MG tablet Take 10 mg by mouth daily.  . hydrocortisone cream 1 % Apply 1 application topically daily as needed  for itching.  . metFORMIN (GLUCOPHAGE-XR) 500 MG 24 hr tablet Take 500 mg by mouth 2 (two) times daily after a meal.   . Multiple Vitamin (MULTIVITAMIN WITH MINERALS) TABS tablet Take 1 tablet by mouth daily.  . nitroGLYCERIN (NITROSTAT) 0.4 MG SL tablet Place 1 tablet (0.4 mg total) under the tongue every 5 (five) minutes as needed for chest pain.  . pantoprazole (PROTONIX) 40 MG tablet Take 1 tablet (40 mg total) by mouth 2 (two) times daily.  . rosuvastatin (CRESTOR) 5 MG tablet Take 5 mg by mouth daily.  . sodium chloride (OCEAN) 0.65 % SOLN nasal spray Place 1 spray into both nostrils as needed for congestion.     Allergies:   Codeine; Lipitor [atorvastatin]; Zetia [ezetimibe]; and Zocor [simvastatin]   Social History   Socioeconomic History  . Marital status: Divorced    Spouse name: None  . Number of children: None  . Years of education: None  . Highest education level: None  Social Needs  . Financial resource strain: None  . Food insecurity - worry: None  . Food insecurity - inability: None  . Transportation needs - medical: None  . Transportation needs - non-medical: None  Occupational History  . Occupation: Chief Strategy Officer  Tobacco Use  . Smoking status: Former Smoker    Packs/day: 3.00    Years: 35.00    Pack years: 105.00    Types: Cigarettes    Last attempt to quit: 04/09/1991    Years since quitting: 26.1  . Smokeless tobacco: Never Used  Substance and Sexual Activity  . Alcohol use: No    Alcohol/week: 0.0 oz  . Drug use: Yes    Types: Marijuana    Comment:   marijuana last used 2- weeks ago as of 06-20-16  . Sexual activity: None  Other Topics Concern  . None  Social History Narrative  . None     Family History: The patient's family history includes CAD in his father; Cancer in his brother; Diabetic kidney disease in his mother; Healthy in his sister and sister; Heart attack (age of onset: 36) in his father. ROS:   Please see the history of present illness.      All other systems reviewed and are negative.  EKGs/Labs/Other Studies Reviewed:    The following studies were reviewed today: Nuclear stress test as described above. Markedly abnormal ETT with ST segment depression. Perfusion images are normal.  Cath 7/49/44:  LV end diastolic pressure is normal.  1st Mrg lesion, 45 %stenosed.  Ost Ramus lesion, 99 %stenosed. Vessel is less than 1.5 mm  Culprit lesion: Bifurcation Cx-OM1 (1,1,1) lesion  A STENT SYNERGY DES 2.25X12 drug eluting stent was successfully placed.  Prox Cx-1 lesion, 80 %stenosed involving Ost 1st Mrg lesion, 80 %stenosed. ([place] 1,1,1)  A STENT SYNERGY DES 2.25X12 drug eluting stent was successfully placed crossing from the main Cx into OM1. Postdilated to  2.5 mm  Post intervention, there is a 0% residual stenosis.  Prox Cx-2 lesion, 80 %stenosed in the follow on AV groove portion of the circumflex after OM1. The bifurcation lesion.  Post Cutting Balloon then post PCI of OM PTCA intervention, there is a 10% residual stenosis.   Successful bifurcation PCI/PTCA of the circumflex into OM1 and into the AV groove circumflex for PTCA. OM1 is a larger vessel than the follow on AV groove circumflex proximal therefore decided to stent into the OM and PTCA the ostium.  Mild to moderate disease elsewhere. Normal LVEDP.  Plan:  Patient will need to be on aspirin plus Plavix for at least 6 months. I used a Synergy stent in order to allow for potentially safe stopping of the Plavix if necessary for Wegener's-related bleeding.  Patient was transferred back to the short stay holding area for TR band removal. If stable after bedrest, he would be ready for discharge today as per same day discharge planning.  Defer further management to Dr. Marlou Porch.  The patient is intolerant of statins, and is bradycardic at baseline precluding beta blocker use.    Brian Hudson, M.D., M.S.  Barium swallow: 04/17/17 Fluoroscopic  evaluation of swallowing demonstrates disruption of 3 out of 4 primary esophageal peristaltic waves. There is a small hiatal hernia. Mild mucosal ring in the distal esophagus. No reflux with the water siphon maneuver. The patient swallowed a 13 mm barium tablet which freely passed into the stomach.  IMPRESSION: Nonspecific esophageal motility disorder.  Small hiatal hernia.  Mild mucosal ring in the distal esophagus. The 13 mm barium tablet freely passes into the stomach.  Cardiopulmonary exercise test: 05/12/17:  Conclusion: Exercise testing with gas exchange demonstrates low-normal functional capacity when compared to matched sedentary norms. There is no indication for ventilatory limitation. Several parameters indicate and suggest cardiovascular ischemia is a likely the primary factor in patient's intolerance. There was ST-depression in the absence of chest pain or dizziness at higher intensity exercise and at peak workload. There was a hypertensive response and chronotropic incompetence simultaneously during exercise. Study should be clinically correlated accordingly.   EKG:  EKG is not ordered today. Baseline ECG from exercise treadmill test shows sinus rhythm heart rate 49. Personally viewed.  Recent Labs: 09/06/2016: ALT 20; TSH 0.873 09/19/2016: BUN 13; Creatinine, Ser 1.14; Hemoglobin 13.9; Platelets 144; Potassium 4.4; Sodium 135   Recent Lipid Panel    Component Value Date/Time   CHOL 185 09/06/2016 0912   TRIG 89 09/06/2016 0912   HDL 33 (L) 09/06/2016 0912   CHOLHDL 5.6 (H) 09/06/2016 0912   LDLCALC 134 (H) 09/06/2016 0912    Physical Exam:    VS:  BP 132/66   Pulse (!) 57   Ht 5\' 9"  (1.753 m)   Wt 168 lb 4 oz (76.3 kg)   SpO2 97%   BMI 24.85 kg/m     Wt Readings from Last 3 Encounters:  05/15/17 168 lb 4 oz (76.3 kg)  05/06/17 165 lb 8 oz (75.1 kg)  04/29/17 165 lb 6.4 oz (75 kg)     GEN: Well nourished, well developed, in no acute distress  HEENT:  normal  Neck: no JVD, carotid bruits, or masses Cardiac: RRR; no murmurs, rubs, or gallops,no edema  Respiratory:  clear to auscultation bilaterally, normal work of breathing GI: soft, nontender, nondistended, + BS MS: no deformity or atrophy  Skin: warm and dry, no rash Neuro:  Alert and Oriented x 3, Strength and sensation are  intact Psych: euthymic mood, full affect    ASSESSMENT:    1. Dyspnea on exertion   2. Angina, class III ([place])   3. Pre-op examination   4. Coronary artery disease with angina pectoris, unspecified vessel or lesion type, unspecified whether native or transplanted heart [facility])    PLAN:    In order of problems listed above:  Coronary artery disease status post circumflex stent June 2018 with angina -   With his ongoing symptoms, mostly shortness of breath and central chest pain , and recent cardiopulmonary function study that was abnormal, I think it makes sense for [place] to make sure that his coronary anatomy is stable with cardiac catheterization.  Risks and benefits discussed including stroke, heart attack, death, renal impairment, bleeding.  He is willing to proceed. It is fine for him to take nitroglycerin if necessary.    Dysphagia  - He is also battling GERD but the doubling up of Protonix seems to have helped. Barium swallow as above. Continue to follow up GI.   Coronary artery calcification  - LAD and circumflex, continue with Crestor  Diabetes  -Aggressive prevention.  Edema with rash  - amlodipine cut from 10 to 5. May help. With BP so low may be able to come off.   History of Wegener's - [name] doubts that his sinus issue was due to [facility].  Do not know why he has a chronic sinus issue however.  He also has a nodule of left lung which is not think his Wegener's.  Following with CT.    Medication Adjustments/Labs and Tests Ordered: Current medicines are reviewed at length with the patient today.  Concerns regarding medicines are  outlined above. Labs and tests ordered and medication changes are outlined in the patient instructions below:  Patient Instructions  Medication Instructions:  The current medical regimen is effective;  continue present plan and medications.  Labwork: Please have blood work today  (BMP, CBC and PT/INR).  Testing/Procedures: Your physician has requested that you have a cardiac catheterization. Cardiac catheterization is used to diagnose and/or treat various heart conditions. Doctors may recommend this procedure for a number of different reasons. The most common reason is to evaluate chest pain. Chest pain can be a symptom of coronary artery disease (CAD), and cardiac catheterization can show whether plaque is narrowing or blocking your heart's arteries. This procedure is also used to evaluate the valves, as well as measure the blood flow and oxygen levels in different parts of your heart. For further information please visit [email or web address]. Please follow instruction sheet, as given.  Follow-Up: Follow up after your cardiac cath as scheduled.  If you need a refill on your cardiac medications before your next appointment, please call your pharmacy.  Thank you for choosing [facility]!!      [facility] [address], [address] [place] [address] Dept: [phone] Loc: [phone]  Brian Hudson  05/15/2017  You are scheduled for a Cardiac Cath on Friday, May 23, 2017 with Dr. Lauree Chandler.  1. Please arrive at the [facility] (Main Entrance A) at [facility]: [address] [place], [place] [address] at 5:30 am (two hours before your procedure to ensure your preparation). Free valet parking service is available.   Special note: Every effort is made to have your procedure done on time. Please understand that emergencies sometimes delay scheduled procedures.  2.  Diet: Nothing  to eat or drink after midnight.  3. Labs: please have blood work today (BMP, CBC and PT/INR).  4. Medication instructions in preparation for your procedure: Stop Metformin 24 hours before and continue to hold 48 hours after.  You may take take all other medications as listed.  You may use sips of water.  5. Plan for one night stay--bring personal belongings. 6. Bring a current list of your medications and current insurance cards. 7. You MUST have a responsible person to drive you home. 8. Someone MUST be with you the first 24 hours after you arrive home or your discharge will be delayed. 9. Please wear clothes that are easy to get on and off and wear slip-on shoes.  Thank you for allowing [place] to care for you!   -- [facility]     Signed, Candee Furbish, MD  05/15/2017 2:55 PM    [place]

## 2017-05-15 NOTE — H&P (View-Only) (Signed)
Cardiology Office Note:    Date:  05/15/2017   ID:  Brian Hudson, DOB 1951/01/09, MRN [id number]  PCP:  Josetta Huddle, MD  Cardiologist:  Candee Furbish, MD    Referring MD: Josetta Huddle, MD     History of Present Illness:    Brian Hudson is a 67 y.o. male with diabetes who underwent nuclear stress test at the request of [name], NP that demonstrated a markedly abnormal exercise treadmill. Marked ST segment depression, 2 mm diffuse. AVR elevation. His nuclear imaging was normal however. EF 53%. No perfusion defects. No transient ischemic dilatation.  Because of these findings, he ended up having a cardiac catheterization which did reveal significant CAD and resulted in DES to proximal left circumflex, and Cutting Balloon distal OM.  The stress test was performed because of coronary calcification seen on CT scan. [name] also had seen him and noted that he did not desaturate. He was concerned about possible chronotropic incompetence. His heart rate at baseline was in the 40s.  His father had heart attack at age 40. Coronary calcifications are noted in the LAD and circumflex arteries.  05/06/17  - some pressure with activity. Laying on left side or right side some pain. Right leg rash started a month ago. Debbie sister here. Now taking 2x PPI.   05/15/17 -He saw [name] who performed a cardiopulmonary function study and this demonstrated ST segment depression once again on the ECG portion with some difficulty getting his heart rate increased.  Could be medication related.  With his ongoing dyspnea symptoms, we have decided to pursue a cardiac catheterization.  Past Medical History:  Diagnosis Date  . Anxiety   . CAD (coronary artery disease)    a. 09/20/16: 45% OM, 99% ostial ramus s/p DES, 80% proximal LCx s/p DES, 80% more distal LCx on AV groove portion of the circumflex after OM1 s/p cutting balloon and PCTA.   Marland Kitchen Concussion   . DM (diabetes mellitus) ([place])    type 2  .  ED (erectile dysfunction)   . Fracture 2010   L 3 and L 4 healed with brace  . GERD (gastroesophageal reflux disease)   . HTN (hypertension)   . Hyperlipidemia   . IBS (irritable bowel syndrome)   . Palpitations    none in last few yrs  . Prostate cancer ([place]) 2007  . Sigmoid diverticulosis   . Wegener's granulomatosis ([place])    followed by dr Berna Bue    Past Surgical History:  Procedure Laterality Date  . COLONOSCOPY N/A 06/24/2016   Procedure: COLONOSCOPY with propofol;  Surgeon: Garlan Fair, MD;  Location: [facility];  Service: Endoscopy;  Laterality: N/A;  . colonscopy  2008  . CORONARY STENT INTERVENTION N/A 09/20/2016   Procedure: Coronary Stent Intervention;  Surgeon: Leonie Man, MD;  Location: [facility];  Service: Cardiovascular;  Laterality: N/A;  . INSERTION PROSTATE RADIATION SEED  2007  . INTRAVASCULAR PRESSURE WIRE/FFR STUDY N/A 09/20/2016   Procedure: Intravascular Pressure Wire/FFR Study;  Surgeon: Leonie Man, MD;  Location: [facility];  Service: Cardiovascular;  Laterality: N/A;  . LEFT HEART CATH AND CORONARY ANGIOGRAPHY N/A 09/20/2016   Procedure: Left Heart Cath and Coronary Angiography;  Surgeon: Leonie Man, MD;  Location: [facility];  Service: Cardiovascular;  Laterality: N/A;  . ROTATOR CUFF REPAIR Right     Current Medications: Current Meds  Medication Sig  . acetaminophen (TYLENOL) 325 MG  tablet Take 650 mg by mouth every morning.   Marland Kitchen amLODipine (NORVASC) 5 MG tablet Take 1 tablet (5 mg total) by mouth daily.  Marland Kitchen aspirin EC 81 MG tablet Take 1 tablet (81 mg total) by mouth daily.  . budesonide-formoterol (SYMBICORT) 160-4.5 MCG/ACT inhaler Inhale 2 puffs into the lungs 2 (two) times daily.  . clopidogrel (PLAVIX) 75 MG tablet Take 1 tablet (75 mg total) by mouth daily.  Marland Kitchen escitalopram (LEXAPRO) 10 MG tablet Take 10 mg by mouth daily.  . hydrocortisone cream 1 % Apply 1 application topically daily as needed  for itching.  . metFORMIN (GLUCOPHAGE-XR) 500 MG 24 hr tablet Take 500 mg by mouth 2 (two) times daily after a meal.   . Multiple Vitamin (MULTIVITAMIN WITH MINERALS) TABS tablet Take 1 tablet by mouth daily.  . nitroGLYCERIN (NITROSTAT) 0.4 MG SL tablet Place 1 tablet (0.4 mg total) under the tongue every 5 (five) minutes as needed for chest pain.  . pantoprazole (PROTONIX) 40 MG tablet Take 1 tablet (40 mg total) by mouth 2 (two) times daily.  . rosuvastatin (CRESTOR) 5 MG tablet Take 5 mg by mouth daily.  . sodium chloride (OCEAN) 0.65 % SOLN nasal spray Place 1 spray into both nostrils as needed for congestion.     Allergies:   Codeine; Lipitor [atorvastatin]; Zetia [ezetimibe]; and Zocor [simvastatin]   Social History   Socioeconomic History  . Marital status: Divorced    Spouse name: None  . Number of children: None  . Years of education: None  . Highest education level: None  Social Needs  . Financial resource strain: None  . Food insecurity - worry: None  . Food insecurity - inability: None  . Transportation needs - medical: None  . Transportation needs - non-medical: None  Occupational History  . Occupation: Chief Strategy Officer  Tobacco Use  . Smoking status: Former Smoker    Packs/day: 3.00    Years: 35.00    Pack years: 105.00    Types: Cigarettes    Last attempt to quit: 04/09/1991    Years since quitting: 26.1  . Smokeless tobacco: Never Used  Substance and Sexual Activity  . Alcohol use: No    Alcohol/week: 0.0 oz  . Drug use: Yes    Types: Marijuana    Comment:   marijuana last used 2- weeks ago as of 06-20-16  . Sexual activity: None  Other Topics Concern  . None  Social History Narrative  . None     Family History: The patient's family history includes CAD in his father; Cancer in his brother; Diabetic kidney disease in his mother; Healthy in his sister and sister; Heart attack (age of onset: 12) in his father. ROS:   Please see the history of present illness.      All other systems reviewed and are negative.  EKGs/Labs/Other Studies Reviewed:    The following studies were reviewed today: Nuclear stress test as described above. Markedly abnormal ETT with ST segment depression. Perfusion images are normal.  Cath 8/41/66:  LV end diastolic pressure is normal.  1st Mrg lesion, 45 %stenosed.  Ost Ramus lesion, 99 %stenosed. Vessel is less than 1.5 mm  Culprit lesion: Bifurcation Cx-OM1 (1,1,1) lesion  A STENT SYNERGY DES 2.25X12 drug eluting stent was successfully placed.  Prox Cx-1 lesion, 80 %stenosed involving Ost 1st Mrg lesion, 80 %stenosed. ([place] 1,1,1)  A STENT SYNERGY DES 2.25X12 drug eluting stent was successfully placed crossing from the main Cx into OM1. Postdilated to  2.5 mm  Post intervention, there is a 0% residual stenosis.  Prox Cx-2 lesion, 80 %stenosed in the follow on AV groove portion of the circumflex after OM1. The bifurcation lesion.  Post Cutting Balloon then post PCI of OM PTCA intervention, there is a 10% residual stenosis.   Successful bifurcation PCI/PTCA of the circumflex into OM1 and into the AV groove circumflex for PTCA. OM1 is a larger vessel than the follow on AV groove circumflex proximal therefore decided to stent into the OM and PTCA the ostium.  Mild to moderate disease elsewhere. Normal LVEDP.  Plan:  Patient will need to be on aspirin plus Plavix for at least 6 months. I used a Synergy stent in order to allow for potentially safe stopping of the Plavix if necessary for Wegener's-related bleeding.  Patient was transferred back to the short stay holding area for TR band removal. If stable after bedrest, he would be ready for discharge today as per same day discharge planning.  Defer further management to Dr. Marlou Porch.  The patient is intolerant of statins, and is bradycardic at baseline precluding beta blocker use.    Glenetta Hew, M.D., M.S.  Barium swallow: 04/17/17 Fluoroscopic  evaluation of swallowing demonstrates disruption of 3 out of 4 primary esophageal peristaltic waves. There is a small hiatal hernia. Mild mucosal ring in the distal esophagus. No reflux with the water siphon maneuver. The patient swallowed a 13 mm barium tablet which freely passed into the stomach.  IMPRESSION: Nonspecific esophageal motility disorder.  Small hiatal hernia.  Mild mucosal ring in the distal esophagus. The 13 mm barium tablet freely passes into the stomach.  Cardiopulmonary exercise test: 05/12/17:  Conclusion: Exercise testing with gas exchange demonstrates low-normal functional capacity when compared to matched sedentary norms. There is no indication for ventilatory limitation. Several parameters indicate and suggest cardiovascular ischemia is a likely the primary factor in patient's intolerance. There was ST-depression in the absence of chest pain or dizziness at higher intensity exercise and at peak workload. There was a hypertensive response and chronotropic incompetence simultaneously during exercise. Study should be clinically correlated accordingly.   EKG:  EKG is not ordered today. Baseline ECG from exercise treadmill test shows sinus rhythm heart rate 49. Personally viewed.  Recent Labs: 09/06/2016: ALT 20; TSH 0.873 09/19/2016: BUN 13; Creatinine, Ser 1.14; Hemoglobin 13.9; Platelets 144; Potassium 4.4; Sodium 135   Recent Lipid Panel    Component Value Date/Time   CHOL 185 09/06/2016 0912   TRIG 89 09/06/2016 0912   HDL 33 (L) 09/06/2016 0912   CHOLHDL 5.6 (H) 09/06/2016 0912   LDLCALC 134 (H) 09/06/2016 0912    Physical Exam:    VS:  BP 132/66   Pulse (!) 57   Ht 5\' 9"  (1.753 m)   Wt 168 lb 4 oz (76.3 kg)   SpO2 97%   BMI 24.85 kg/m     Wt Readings from Last 3 Encounters:  05/15/17 168 lb 4 oz (76.3 kg)  05/06/17 165 lb 8 oz (75.1 kg)  04/29/17 165 lb 6.4 oz (75 kg)     GEN: Well nourished, well developed, in no acute distress  HEENT:  normal  Neck: no JVD, carotid bruits, or masses Cardiac: RRR; no murmurs, rubs, or gallops,no edema  Respiratory:  clear to auscultation bilaterally, normal work of breathing GI: soft, nontender, nondistended, + BS MS: no deformity or atrophy  Skin: warm and dry, no rash Neuro:  Alert and Oriented x 3, Strength and sensation are  intact Psych: euthymic mood, full affect    ASSESSMENT:    1. Dyspnea on exertion   2. Angina, class III ([place])   3. Pre-op examination   4. Coronary artery disease with angina pectoris, unspecified vessel or lesion type, unspecified whether native or transplanted heart [facility])    PLAN:    In order of problems listed above:  Coronary artery disease status post circumflex stent June 2018 with angina -   With his ongoing symptoms, mostly shortness of breath and central chest pain , and recent cardiopulmonary function study that was abnormal, I think it makes sense for [place] to make sure that his coronary anatomy is stable with cardiac catheterization.  Risks and benefits discussed including stroke, heart attack, death, renal impairment, bleeding.  He is willing to proceed. It is fine for him to take nitroglycerin if necessary.    Dysphagia  - He is also battling GERD but the doubling up of Protonix seems to have helped. Barium swallow as above. Continue to follow up GI.   Coronary artery calcification  - LAD and circumflex, continue with Crestor  Diabetes  -Aggressive prevention.  Edema with rash  - amlodipine cut from 10 to 5. May help. With BP so low may be able to come off.   History of Wegener's - [name] doubts that his sinus issue was due to [facility].  Do not know why he has a chronic sinus issue however.  He also has a nodule of left lung which is not think his Wegener's.  Following with CT.    Medication Adjustments/Labs and Tests Ordered: Current medicines are reviewed at length with the patient today.  Concerns regarding medicines are  outlined above. Labs and tests ordered and medication changes are outlined in the patient instructions below:  Patient Instructions  Medication Instructions:  The current medical regimen is effective;  continue present plan and medications.  Labwork: Please have blood work today  (BMP, CBC and PT/INR).  Testing/Procedures: Your physician has requested that you have a cardiac catheterization. Cardiac catheterization is used to diagnose and/or treat various heart conditions. Doctors may recommend this procedure for a number of different reasons. The most common reason is to evaluate chest pain. Chest pain can be a symptom of coronary artery disease (CAD), and cardiac catheterization can show whether plaque is narrowing or blocking your heart's arteries. This procedure is also used to evaluate the valves, as well as measure the blood flow and oxygen levels in different parts of your heart. For further information please visit HugeFiesta.[email or web address]. Please follow instruction sheet, as given.  Follow-Up: Follow up after your cardiac cath as scheduled.  If you need a refill on your cardiac medications before your next appointment, please call your pharmacy.  Thank you for choosing [facility]!!      [facility] [address], [place] [address] [place] [address] Dept: [phone] Loc: [phone]  Brian Hudson  05/15/2017  You are scheduled for a Cardiac Cath on Friday, May 23, 2017 with Dr. Lauree Chandler.  1. Please arrive at the [facility] (Main Entrance A) at [facility]: [address] [place], [place] [address] at 5:30 am (two hours before your procedure to ensure your preparation). Free valet parking service is available.   Special note: Every effort is made to have your procedure done on time. Please understand that emergencies sometimes delay scheduled procedures.  2.  Diet: Nothing  to eat or drink after midnight.  3. Labs: please have blood work today (BMP, CBC and PT/INR).  4. Medication instructions in preparation for your procedure: Stop Metformin 24 hours before and continue to hold 48 hours after.  You may take take all other medications as listed.  You may use sips of water.  5. Plan for one night stay--bring personal belongings. 6. Bring a current list of your medications and current insurance cards. 7. You MUST have a responsible person to drive you home. 8. Someone MUST be with you the first 24 hours after you arrive home or your discharge will be delayed. 9. Please wear clothes that are easy to get on and off and wear slip-on shoes.  Thank you for allowing [place] to care for you!   -- [facility]     Signed, Candee Furbish, MD  05/15/2017 2:55 PM    [place]

## 2017-05-15 NOTE — Patient Instructions (Signed)
Medication Instructions:  The current medical regimen is effective;  continue present plan and medications.  Labwork: Please have blood work today  (BMP, CBC and PT/INR).  Testing/Procedures: Your physician has requested that you have a cardiac catheterization. Cardiac catheterization is used to diagnose and/or treat various heart conditions. Doctors may recommend this procedure for a number of different reasons. The most common reason is to evaluate chest pain. Chest pain can be a symptom of coronary artery disease (CAD), and cardiac catheterization can show whether plaque is narrowing or blocking your heart's arteries. This procedure is also used to evaluate the valves, as well as measure the blood flow and oxygen levels in different parts of your heart. For further information please visit HugeFiesta.[email or web address]. Please follow instruction sheet, as given.  Follow-Up: Follow up after your cardiac cath as scheduled.  If you need a refill on your cardiac medications before your next appointment, please call your pharmacy.  Thank you for choosing [facility]!!      [facility] [address], [place] [address] [place] [address] Dept: [phone] Loc: [phone]  Brian Hudson  05/15/2017  You are scheduled for a Cardiac Cath on Friday, May 23, 2017 with Dr. Lauree Chandler.  1. Please arrive at the [facility] (Main Entrance A) at [facility]: [address] [place],  [address] at 5:30 am (two hours before your procedure to ensure your preparation). Free valet parking service is available.   Special note: Every effort is made to have your procedure done on time. Please understand that emergencies sometimes delay scheduled procedures.  2. Diet: Nothing to eat or drink after midnight.  3. Labs: please have blood work today (BMP, CBC and PT/INR).  4. Medication  instructions in preparation for your procedure: Stop Metformin 24 hours before and continue to hold 48 hours after.  You may take take all other medications as listed.  You may use sips of water.  5. Plan for one night stay--bring personal belongings. 6. Bring a current list of your medications and current insurance cards. 7. You MUST have a responsible person to drive you home. 8. Someone MUST be with you the first 24 hours after you arrive home or your discharge will be delayed. 9. Please wear clothes that are easy to get on and off and wear slip-on shoes.  Thank you for allowing [place] to care for you!   -- [facility]

## 2017-05-16 LAB — BASIC METABOLIC PANEL
BUN / CREAT RATIO: 7 — AB (ref 10–24)
BUN: 8 mg/dL (ref 8–27)
CO2: 24 mmol/L (ref 20–29)
CREATININE: 1.08 mg/dL (ref 0.76–1.27)
Calcium: 10.8 mg/dL — ABNORMAL HIGH (ref 8.6–10.2)
Chloride: 100 mmol/L (ref 96–106)
GFR calc Af Amer: 81 mL/min/{1.73_m2} (ref >59)
GFR calc non Af Amer: 70 mL/min/{1.73_m2} (ref >59)
Glucose: 275 mg/dL — ABNORMAL HIGH (ref 65–99)
Potassium: 4.4 mmol/L (ref 3.5–5.2)
SODIUM: 139 mmol/L (ref 134–144)

## 2017-05-16 LAB — CBC
HEMOGLOBIN: 12.6 g/dL — AB (ref 13.0–17.7)
Hematocrit: 37 % — ABNORMAL LOW (ref 37.5–51.0)
MCH: 30.2 pg (ref 26.6–33.0)
MCHC: 34.1 g/dL (ref 31.5–35.7)
MCV: 89 fL (ref 79–97)
Platelets: 180 10*3/uL (ref 150–379)
RBC: 4.17 x10E6/uL (ref 4.14–5.80)
RDW: 13.4 % (ref 12.3–15.4)
WBC: 5.6 10*3/uL (ref 3.4–10.8)

## 2017-05-16 LAB — PROTIME-INR
INR: 1 (ref 0.8–1.2)
Prothrombin Time: 10.4 s (ref 9.1–12.0)

## 2017-05-20 NOTE — Progress Notes (Signed)
Called and spoke to pt. Informed him of the results and recs per MR. Pt verbalized understanding and denied any further questions or concerns at this time.  

## 2017-05-20 NOTE — Progress Notes (Signed)
Pt states he has an upcoming heart cath on 06/06/2017. Will send to MR as FYI.

## 2017-05-21 DIAGNOSIS — M3130 Wegener's granulomatosis without renal involvement: Secondary | ICD-10-CM | POA: Diagnosis not present

## 2017-05-21 DIAGNOSIS — D631 Anemia in chronic kidney disease: Secondary | ICD-10-CM | POA: Diagnosis not present

## 2017-05-21 DIAGNOSIS — N183 Chronic kidney disease, stage 3 (moderate): Secondary | ICD-10-CM | POA: Diagnosis not present

## 2017-05-22 ENCOUNTER — Telehealth: Payer: Self-pay | Admitting: *Deleted

## 2017-05-22 NOTE — Telephone Encounter (Signed)
Heart Cath scheduled at [facility] for: Friday February 15,2019 at 7:30 AM Verify arrival time and place: [place] Entrance A/[place] at: 5:30 AM  HOLD: No metformin 05/22/17, 05/23/17 and 48 hours after cath  Except hold medications  AM meds can be  taken pre-cath with sip of water including: ASA 81 mg am of cath Plavix 75 mg am of cath     Confirm patient has responsible person to drive home post procedure and observe patient for 24 hours  LMTCB for pt to discuss instructions

## 2017-05-22 NOTE — Telephone Encounter (Signed)
Voice Mail 

## 2017-05-23 ENCOUNTER — Ambulatory Visit (HOSPITAL_COMMUNITY)
Admission: RE | Admit: 2017-05-23 | Discharge: 2017-05-23 | Disposition: A | Discharge status: Home / Self Care | Payer: Medicare Other | Source: Ambulatory Visit | Attending: Cardiovascular Disease | Admitting: Cardiovascular Disease

## 2017-05-23 ENCOUNTER — Ambulatory Visit (HOSPITAL_COMMUNITY)
Admission: RE | Disposition: A | Discharge status: Home / Self Care | Payer: Self-pay | Source: Ambulatory Visit | Attending: Cardiovascular Disease

## 2017-05-23 DIAGNOSIS — E785 Hyperlipidemia, unspecified: Secondary | ICD-10-CM | POA: Diagnosis not present

## 2017-05-23 DIAGNOSIS — Z7984 Long term (current) use of oral hypoglycemic drugs: Secondary | ICD-10-CM | POA: Diagnosis not present

## 2017-05-23 DIAGNOSIS — K224 Dyskinesia of esophagus: Secondary | ICD-10-CM | POA: Insufficient documentation

## 2017-05-23 DIAGNOSIS — Z7982 Long term (current) use of aspirin: Secondary | ICD-10-CM | POA: Diagnosis not present

## 2017-05-23 DIAGNOSIS — K449 Diaphragmatic hernia without obstruction or gangrene: Secondary | ICD-10-CM | POA: Insufficient documentation

## 2017-05-23 DIAGNOSIS — Z955 Presence of coronary angioplasty implant and graft: Secondary | ICD-10-CM

## 2017-05-23 DIAGNOSIS — Z7902 Long term (current) use of antithrombotics/antiplatelets: Secondary | ICD-10-CM | POA: Insufficient documentation

## 2017-05-23 DIAGNOSIS — Z885 Allergy status to narcotic agent status: Secondary | ICD-10-CM | POA: Diagnosis not present

## 2017-05-23 DIAGNOSIS — I10 Essential (primary) hypertension: Secondary | ICD-10-CM | POA: Diagnosis not present

## 2017-05-23 DIAGNOSIS — Z87891 Personal history of nicotine dependence: Secondary | ICD-10-CM | POA: Diagnosis not present

## 2017-05-23 DIAGNOSIS — K589 Irritable bowel syndrome without diarrhea: Secondary | ICD-10-CM | POA: Insufficient documentation

## 2017-05-23 DIAGNOSIS — Z8249 Family history of ischemic heart disease and other diseases of the circulatory system: Secondary | ICD-10-CM | POA: Insufficient documentation

## 2017-05-23 DIAGNOSIS — K219 Gastro-esophageal reflux disease without esophagitis: Secondary | ICD-10-CM | POA: Insufficient documentation

## 2017-05-23 DIAGNOSIS — E119 Type 2 diabetes mellitus without complications: Secondary | ICD-10-CM | POA: Diagnosis not present

## 2017-05-23 DIAGNOSIS — F419 Anxiety disorder, unspecified: Secondary | ICD-10-CM | POA: Diagnosis not present

## 2017-05-23 DIAGNOSIS — R0600 Dyspnea, unspecified: Secondary | ICD-10-CM | POA: Diagnosis present

## 2017-05-23 DIAGNOSIS — R0609 Other forms of dyspnea: Secondary | ICD-10-CM | POA: Diagnosis present

## 2017-05-23 DIAGNOSIS — I200 Unstable angina: Secondary | ICD-10-CM

## 2017-05-23 DIAGNOSIS — I25110 Atherosclerotic heart disease of native coronary artery with unstable angina pectoris: Secondary | ICD-10-CM | POA: Insufficient documentation

## 2017-05-23 DIAGNOSIS — I2510 Atherosclerotic heart disease of native coronary artery without angina pectoris: Secondary | ICD-10-CM | POA: Diagnosis present

## 2017-05-23 HISTORY — PX: LEFT HEART CATH AND CORONARY ANGIOGRAPHY: CATH118249

## 2017-05-23 HISTORY — PX: CORONARY STENT INTERVENTION: CATH118234

## 2017-05-23 LAB — POCT ACTIVATED CLOTTING TIME: ACTIVATED CLOTTING TIME: 268 s

## 2017-05-23 LAB — GLUCOSE, CAPILLARY
GLUCOSE-CAPILLARY: 200 mg/dL — AB (ref 65–99)
GLUCOSE-CAPILLARY: 172 mg/dL — AB (ref 65–99)

## 2017-05-23 SURGERY — LEFT HEART CATH AND CORONARY ANGIOGRAPHY
Anesthesia: LOCAL

## 2017-05-23 MED ORDER — MIDAZOLAM HCL 2 MG/2ML IJ SOLN
INTRAMUSCULAR | Status: DC | PRN
  Administered 2017-05-23: 2 mg via INTRAVENOUS

## 2017-05-23 MED ORDER — MIDAZOLAM HCL 2 MG/2ML IJ SOLN
INTRAMUSCULAR | Status: AC
  Filled 2017-05-23: qty 2

## 2017-05-23 MED ORDER — CLOPIDOGREL BISULFATE 75 MG PO TABS: 75.0000 mg | ORAL_TABLET | ORAL | Status: DC

## 2017-05-23 MED ORDER — HYDRALAZINE HCL 20 MG/ML IJ SOLN: 5.0000 mg | INTRAMUSCULAR | Status: AC | PRN

## 2017-05-23 MED ORDER — LIDOCAINE HCL (PF) 1 % IJ SOLN
INTRAMUSCULAR | Status: AC
  Filled 2017-05-23: qty 30

## 2017-05-23 MED ORDER — SODIUM CHLORIDE 0.9% FLUSH: 3.0000 mL | Freq: Two times a day (BID) | INTRAVENOUS | Status: DC

## 2017-05-23 MED ORDER — SODIUM CHLORIDE 0.9 % WEIGHT BASED INFUSION
3.0000 mL/kg/h | INTRAVENOUS | Status: DC
  Administered 2017-05-23: 3 mL/kg/h via INTRAVENOUS

## 2017-05-23 MED ORDER — IOPAMIDOL (ISOVUE-370) INJECTION 76%
INTRAVENOUS | Status: AC
  Filled 2017-05-23: qty 100

## 2017-05-23 MED ORDER — SODIUM CHLORIDE 0.9 % IV SOLN: 250.0000 mL | INTRAVENOUS | Status: DC | PRN

## 2017-05-23 MED ORDER — SODIUM CHLORIDE 0.9% FLUSH: 3.0000 mL | INTRAVENOUS | Status: DC | PRN

## 2017-05-23 MED ORDER — ONDANSETRON HCL 4 MG/2ML IJ SOLN: 4.0000 mg | Freq: Four times a day (QID) | INTRAMUSCULAR | Status: DC | PRN

## 2017-05-23 MED ORDER — ACETAMINOPHEN 325 MG PO TABS: 650.0000 mg | ORAL_TABLET | ORAL | Status: DC | PRN

## 2017-05-23 MED ORDER — FENTANYL CITRATE (PF) 100 MCG/2ML IJ SOLN
INTRAMUSCULAR | Status: DC | PRN
  Administered 2017-05-23: 25 ug via INTRAVENOUS

## 2017-05-23 MED ORDER — VERAPAMIL HCL 2.5 MG/ML IV SOLN
INTRAVENOUS | Status: AC
  Filled 2017-05-23: qty 2

## 2017-05-23 MED ORDER — ASPIRIN 81 MG PO CHEW
81.0000 mg | CHEWABLE_TABLET | ORAL | Status: AC
  Administered 2017-05-23: 81 mg via ORAL

## 2017-05-23 MED ORDER — VERAPAMIL HCL 2.5 MG/ML IV SOLN
INTRAVENOUS | Status: DC | PRN
  Administered 2017-05-23: 10 mL via INTRA_ARTERIAL

## 2017-05-23 MED ORDER — HEPARIN (PORCINE) IN NACL 2-0.9 UNIT/ML-% IJ SOLN
INTRAMUSCULAR | Status: AC
  Filled 2017-05-23: qty 1000

## 2017-05-23 MED ORDER — IOPAMIDOL (ISOVUE-370) INJECTION 76%
INTRAVENOUS | Status: DC | PRN
  Administered 2017-05-23: 180 mL via INTRA_ARTERIAL

## 2017-05-23 MED ORDER — HEPARIN SODIUM (PORCINE) 1000 UNIT/ML IJ SOLN
INTRAMUSCULAR | Status: DC | PRN
  Administered 2017-05-23: 6000 [IU] via INTRAVENOUS
  Administered 2017-05-23: 2000 [IU] via INTRAVENOUS
  Administered 2017-05-23: 4000 [IU] via INTRAVENOUS

## 2017-05-23 MED ORDER — HEPARIN (PORCINE) IN NACL 2-0.9 UNIT/ML-% IJ SOLN
INTRAMUSCULAR | Status: AC | PRN
  Administered 2017-05-23 (×2): 500 mL

## 2017-05-23 MED ORDER — HEPARIN SODIUM (PORCINE) 1000 UNIT/ML IJ SOLN
INTRAMUSCULAR | Status: AC
  Filled 2017-05-23: qty 1

## 2017-05-23 MED ORDER — SODIUM CHLORIDE 0.9 % WEIGHT BASED INFUSION: 1.0000 mL/kg/h | INTRAVENOUS | Status: DC

## 2017-05-23 MED ORDER — CLOPIDOGREL BISULFATE 300 MG PO TABS
ORAL_TABLET | ORAL | Status: AC
  Filled 2017-05-23: qty 1

## 2017-05-23 MED ORDER — LABETALOL HCL 5 MG/ML IV SOLN: 10.0000 mg | INTRAVENOUS | Status: AC | PRN

## 2017-05-23 MED ORDER — ASPIRIN 81 MG PO CHEW
CHEWABLE_TABLET | ORAL | Status: AC
  Administered 2017-05-23: 81 mg via ORAL
  Filled 2017-05-23: qty 1

## 2017-05-23 MED ORDER — FENTANYL CITRATE (PF) 100 MCG/2ML IJ SOLN
INTRAMUSCULAR | Status: AC
  Filled 2017-05-23: qty 2

## 2017-05-23 MED ORDER — CLOPIDOGREL BISULFATE 300 MG PO TABS
ORAL_TABLET | ORAL | Status: DC | PRN
  Administered 2017-05-23: 300 mg via ORAL

## 2017-05-23 MED ORDER — ANGIOPLASTY BOOK
Freq: Once | Status: DC
  Filled 2017-05-23 (×3): qty 1

## 2017-05-23 MED ORDER — LIDOCAINE HCL (PF) 1 % IJ SOLN
INTRAMUSCULAR | Status: DC | PRN
  Administered 2017-05-23: 2 mL

## 2017-05-23 MED ORDER — SODIUM CHLORIDE 0.9 % IV SOLN: INTRAVENOUS | Status: AC

## 2017-05-23 SURGICAL SUPPLY — 20 items
BALLN EMERGE MR 2.0X8 (BALLOONS) ×2
BALLN ~~LOC~~ EMERGE MR 2.25X6 (BALLOONS) ×2
BALLOON EMERGE MR 2.0X8 (BALLOONS) IMPLANT
BALLOON ~~LOC~~ EMERGE MR 2.25X6 (BALLOONS) IMPLANT
CATH INFINITI 5 FR JL3.5 (CATHETERS) ×1 IMPLANT
CATH INFINITI 5FR ANG PIGTAIL (CATHETERS) ×1 IMPLANT
CATH INFINITI JR4 5F (CATHETERS) ×1 IMPLANT
CATH VISTA GUIDE 6FR XBLAD3.5 (CATHETERS) ×1 IMPLANT
DEVICE RAD COMP TR BAND LRG (VASCULAR PRODUCTS) ×1 IMPLANT
GLIDESHEATH SLEND SS 6F .021 (SHEATH) ×1 IMPLANT
GUIDEWIRE INQWIRE 1.5J.035X260 (WIRE) IMPLANT
INQWIRE 1.5J .035X260CM (WIRE) ×2
KIT ENCORE 26 ADVANTAGE (KITS) ×1 IMPLANT
KIT HEART LEFT (KITS) ×2 IMPLANT
PACK CARDIAC CATHETERIZATION (CUSTOM PROCEDURE TRAY) ×2 IMPLANT
STENT SYNERGY DES 2.25X8 (Permanent Stent) ×1 IMPLANT
SYR MEDRAD MARK V 150ML (SYRINGE) ×2 IMPLANT
TRANSDUCER W/STOPCOCK (MISCELLANEOUS) ×2 IMPLANT
TUBING CIL FLEX 10 FLL-RA (TUBING) ×2 IMPLANT
WIRE COUGAR XT STRL 190CM (WIRE) ×1 IMPLANT

## 2017-05-23 NOTE — Progress Notes (Signed)
Ed completed with pt and sister. Voiced understanding. Encouraged making diet changes and increasing exercise. Will refer to [place] (he did not do last summer due to a job). Understands importance of Plavix. [id number] Yves Dill CES, ACSM 12:04 PM \\2 /15/2019

## 2017-05-23 NOTE — Interval H&P Note (Signed)
History and Physical Interval Note:  05/23/2017 7:25 AM  Brian Hudson  has presented today for cardiac cath with the diagnosis of dyspnea, unstable angina. The various methods of treatment have been discussed with the patient and family. After consideration of risks, benefits and other options for treatment, the patient has consented to  Procedure(s): LEFT HEART CATH AND CORONARY ANGIOGRAPHY (N/A) as a surgical intervention .  The patient's history has been reviewed, patient examined, no change in status, stable for surgery.  I have reviewed the patient's chart and labs.  Questions were answered to the patient's satisfaction.    Cath Lab Visit (complete for each Cath Lab visit)  Clinical Evaluation Leading to the Procedure:   ACS: No.  Non-ACS:    Anginal Classification: CCS III  Anti-ischemic medical therapy: Minimal Therapy (1 class of medications)  Non-Invasive Test Results: No non-invasive testing performed  Prior CABG: No previous CABG         Lauree Chandler

## 2017-05-23 NOTE — Discharge Instructions (Signed)

## 2017-05-23 NOTE — Research (Signed)
CADFEM Informed Consent   Subject Name: Brian Hudson  Subject met inclusion and exclusion criteria.  The informed consent form, study requirements and expectations were reviewed with the subject and questions and concerns were addressed prior to the signing of the consent form.  The subject verbalized understanding of the trail requirements.  The subject agreed to participate in the CADFEM trial and signed the informed consent.  The informed consent was obtained prior to performance of any protocol-specific procedures for the subject.  A copy of the signed informed consent was given to the subject and a copy was placed in the subject's medical record.  Christena Flake 05/23/2017, 06:55 AM

## 2017-05-23 NOTE — Discharge Summary (Signed)
Discharge Summary    Patient ID: Brian Hudson,  MRN: [id number], DOB/AGE: July 29, 1950 67 y.o.  Admit date: 05/23/2017 Discharge date: 05/23/2017  Primary Care Provider: Josetta Huddle Primary Cardiologist: Dr. Marlou Porch  Discharge Diagnoses    Principal Problem:   Unstable angina [facility]) Active Problems:   Dyspnea on exertion   CAD (coronary artery disease)   Hyperlipidemia   HTN (hypertension)   DM (diabetes mellitus) ([place])   Allergies Allergies  Allergen Reactions  . Codeine Other (See Comments)    "jittery"  . Lipitor [Atorvastatin] Other (See Comments)    arthralgia  . Zetia [Ezetimibe] Other (See Comments)    arthralgia  . Zocor [Simvastatin] Other (See Comments)    arthralgia    Diagnostic Studies/Procedures    CORONARY STENT INTERVENTION  LEFT HEART CATH AND CORONARY ANGIOGRAPHY  Conclusion     Mid RCA lesion is 20% stenosed.  Post Atrio lesion is 50% stenosed.  Ost 1st Mrg lesion is 90% stenosed.  Prox Cx lesion is 10% stenosed.  Ost 2nd Mrg-1 lesion is 10% stenosed.  Ost 2nd Mrg-2 lesion is 80% stenosed.  Ost Cx to Prox Cx lesion is 30% stenosed.  A drug-eluting stent was successfully placed using a STENT SYNERGY DES 2.25X12.  Post intervention, there is a 0% residual stenosis.  Post intervention, there is a 0% residual stenosis.  2nd Mrg lesion is 50% stenosed.  Prox Cx to Mid Cx lesion is 40% stenosed.  Ost LAD to Mid LAD lesion is 30% stenosed.  Ost 1st Diag lesion is 50% stenosed.  1st Diag lesion is 50% stenosed.  Dist LAD lesion is 50% stenosed.  The left ventricular systolic function is normal.  LV end diastolic pressure is normal.  The left ventricular ejection fraction is 50-55% by visual estimate.  There is no mitral valve regurgitation.   1. Non-obstructive diffuse disease in the LAD 2. Severe stenosis very small caliber intermediate branch. Unchanged from last cath and too small for PCI 3. Severe stenosis OM1  just beyond old stent. Successful PTCA/DES x 1 OM1.  4. Non-obstructive disease in the large dominant RCA 5. Normal LV systolic function.   Recommendations: Continue ASA, Plavix and statin. Same day discharge if stable.      Diagnostic Diagram       Post-Intervention Diagram         History of Present Illness     Brian Hudson is a 67 y.o. male with hx of CAD, HTN, HLD and diabetes presented for cath.   He underwent nuclear stress test at the request of [name], NP 09/2016 that demonstrated a markedly abnormal exercise treadmill. Marked ST segment depression, 2 mm diffuse. AVR elevation. His nuclear imaging was normal however. EF 53%. No perfusion defects. No transient ischemic dilatation.  Because of these findings, he ended up having a cardiac catheterization which did reveal significant CAD and resulted in DES to proximal left circumflex, and Cutting Balloon distal OM.  The stress test was performed because of coronary calcification seen on CT scan. [name] also had seen him and noted that he did not desaturate. He was concerned about possible chronotropic incompetence. His heart rate at baseline was in the 40s.  He saw [name] who performed a cardiopulmonary function study 05/12/17 and this demonstrated ST segment depression once again on the ECG portion with some difficulty getting his heart rate increased.  Could be medication related.  With his ongoing dyspnea symptoms, we have decided to  pursue a cardiac catheterization.  Hospital Course     Consultants: None  Cath showed as above. Non obstructive disease in LAD and RCA. Severe stenosis very small caliber intermediate branch. Unchanged from last cath and too small for PCI. Severe stenosis OM1 just beyond old stent s/p successful PTCA/DES x 1 OM1. No immediate or post PCI complications. Radial site stable without hematoma. No chest pain. He will enroll in CRP II. Continue DAPT with ASA and plavix. Deemed stable  for same day PCI candidate.   09/06/2016: Cholesterol, Total 185; HDL 33; LDL Calculated 134; Triglycerides 89. Continue low dose Crestor. Unable to tolerate Zocor and Lipitor. Consider lipid clinic evaluation as outpatient.   The patient has been seen by Dr. Angelena Form today and deemed ready for discharge home. All follow-up appointments have been scheduled. Discharge medications are listed below.    Discharge Vitals Blood pressure (!) 155/73, pulse (!) 54, temperature 98 F (36.7 C), temperature source Oral, resp. rate 15, height 5\' 9"  (1.753 m), weight 169 lb (76.7 kg), SpO2 99 %.  Filed Weights   05/23/17 0547  Weight: 169 lb (76.7 kg)   Physical Exam  Constitutional: He is oriented to person, place, and time and well-developed, well-nourished, and in no distress.  HENT:  Head: Normocephalic and atraumatic.  Eyes: Pupils are equal, round, and reactive to light.  Neck: Normal range of motion. Neck supple.  Cardiovascular: Normal rate and regular rhythm.  No murmur heard. R radial cath site without hematoma  Pulmonary/Chest: Effort normal and breath sounds normal.  Abdominal: Soft. Bowel sounds are normal.  Musculoskeletal: Normal range of motion.  Neurological: He is alert and oriented to person, place, and time.  Skin: Skin is warm and dry.  Psychiatric: Affect normal.   Labs & Radiologic Studies    Ct Maxillofacial Ltd Wo Cm  Result Date: 05/12/2017 CLINICAL DATA:  Chronic sinusitis EXAM: CT PARANASAL SINUS LIMITED WITHOUT CONTRAST TECHNIQUE: Non-contiguous multidetector CT images of the paranasal sinuses were obtained in a single plane without contrast. COMPARISON:  None. FINDINGS: Mild mucosal edema base of right maxillary sinus. Remaining sinuses clear. No air-fluid level. Visualized intracranial contents negative. Soft tissues of the orbit and face negative IMPRESSION: Mild mucosal edema right maxillary sinus.  Remaining sinuses clear. Electronically Signed   By: Franchot Gallo  M.D.   On: 05/12/2017 14:14    Disposition   Pt is being discharged home today in good condition.  Follow-up Plans & Appointments    Follow-up Information    Jerline Pain, MD. Go on 06/06/2017.   Specialty:  Cardiology Why:  @9 :20am for cath follow up Contact information: [address] [place] [address] [phone]          Discharge Instructions    Amb Referral to Cardiac Rehabilitation   Complete by:  As directed    Diagnosis:   Coronary Stents PTCA     Diet - low sodium heart healthy   Complete by:  As directed    Discharge instructions   Complete by:  As directed    No driving for 3 days. No lifting over 5 lbs for 1 week. No sexual activity for 1 week. Marland Kitchen Keep procedure site clean & dry. If you notice increased pain, swelling, bleeding or pus, call/return!  You may shower, but no soaking baths/hot tubs/pools for 1 week.   Hold Metformin for two days. Resume on 05/26/17.   Increase activity slowly   Complete by:  As directed  Discharge Medications   Allergies as of 05/23/2017      Reactions   Codeine Other (See Comments)   "jittery"   Lipitor [atorvastatin] Other (See Comments)   arthralgia   Zetia [ezetimibe] Other (See Comments)   arthralgia   Zocor [simvastatin] Other (See Comments)   arthralgia      Medication List    TAKE these medications   acetaminophen 325 MG tablet Commonly known as:  TYLENOL Take 650 mg by mouth daily.   amLODipine 5 MG tablet Commonly known as:  NORVASC Take 1 tablet (5 mg total) by mouth daily.   aspirin EC 81 MG tablet Take 1 tablet (81 mg total) by mouth daily. What changed:  when to take this   budesonide-formoterol 160-4.5 MCG/ACT inhaler Commonly known as:  SYMBICORT Inhale 2 puffs into the lungs 2 (two) times daily.   clopidogrel 75 MG tablet Commonly known as:  PLAVIX Take 1 tablet (75 mg total) by mouth daily.   escitalopram 10 MG tablet Commonly known as:  LEXAPRO Take 10  mg by mouth daily.   hydrocortisone cream 1 % Apply 1 application topically daily as needed for itching.   metFORMIN 500 MG 24 hr tablet Commonly known as:  GLUCOPHAGE-XR Take 500 mg by mouth 2 (two) times daily after a meal.   multivitamin with minerals Tabs tablet Take 1 tablet by mouth daily.   nitroGLYCERIN 0.4 MG SL tablet Commonly known as:  NITROSTAT Place 1 tablet (0.4 mg total) under the tongue every 5 (five) minutes as needed for chest pain.   pantoprazole 40 MG tablet Commonly known as:  PROTONIX Take 1 tablet (40 mg total) by mouth 2 (two) times daily.   rosuvastatin 5 MG tablet Commonly known as:  CRESTOR Take 5 mg by mouth daily.   sodium chloride 0.65 % Soln nasal spray Commonly known as:  OCEAN Place 1 spray into both nostrils as needed for congestion.           Outstanding Labs/Studies   Consider lipid panel   Duration of Discharge Encounter   Greater than 30 minutes including physician time.  Signed, Crista Luria Prescilla Monger PA-C 05/23/2017, 1:18 PM

## 2017-05-26 ENCOUNTER — Telehealth (HOSPITAL_COMMUNITY): Payer: Self-pay

## 2017-05-26 ENCOUNTER — Encounter (HOSPITAL_COMMUNITY): Payer: Self-pay | Admitting: Cardiovascular Disease

## 2017-05-26 MED FILL — Lidocaine HCl Local Inj 1%: INTRAMUSCULAR | Qty: 20 | Status: AC

## 2017-05-26 MED FILL — Heparin Sodium (Porcine) 2 Unit/ML in Sodium Chloride 0.9%: INTRAMUSCULAR | Qty: 1000 | Status: AC

## 2017-05-26 NOTE — Telephone Encounter (Signed)
Patients insurance is active and benefits verified through [place] - No co-pay, no deductible, out of pocket amount of $5,900/$552.87 has been met, 20% co-insurance, and no pre-authorization is required. Spoke with [employer] - reference # [id number].  Patient will be contacted and scheduled upon review by the RN Navigator.

## 2017-05-27 ENCOUNTER — Ambulatory Visit: Payer: Medicare Other | Admitting: Cardiology

## 2017-05-30 ENCOUNTER — Ambulatory Visit: Payer: Self-pay | Admitting: Internal Medicine

## 2017-06-04 ENCOUNTER — Telehealth (HOSPITAL_COMMUNITY): Payer: Self-pay

## 2017-06-04 DIAGNOSIS — D649 Anemia, unspecified: Secondary | ICD-10-CM | POA: Diagnosis not present

## 2017-06-04 DIAGNOSIS — E1165 Type 2 diabetes mellitus with hyperglycemia: Secondary | ICD-10-CM | POA: Diagnosis not present

## 2017-06-04 DIAGNOSIS — I10 Essential (primary) hypertension: Secondary | ICD-10-CM | POA: Diagnosis not present

## 2017-06-04 DIAGNOSIS — E7800 Pure hypercholesterolemia, unspecified: Secondary | ICD-10-CM | POA: Diagnosis not present

## 2017-06-04 NOTE — Telephone Encounter (Signed)
Called to speak with patient in regards to Cardiac Rehab - Patient is not interested in the program and he is joining a [employer]. Closed referral.

## 2017-06-06 ENCOUNTER — Encounter: Payer: Self-pay | Admitting: Cardiology

## 2017-06-06 ENCOUNTER — Ambulatory Visit: Payer: Medicare Other | Admitting: Cardiology

## 2017-06-06 VITALS — BP 138/70 | HR 51 | Ht 69.0 in | Wt 168.4 lb

## 2017-06-06 DIAGNOSIS — E785 Hyperlipidemia, unspecified: Secondary | ICD-10-CM

## 2017-06-06 DIAGNOSIS — E782 Mixed hyperlipidemia: Secondary | ICD-10-CM

## 2017-06-06 DIAGNOSIS — I25119 Atherosclerotic heart disease of native coronary artery with unspecified angina pectoris: Secondary | ICD-10-CM

## 2017-06-06 DIAGNOSIS — Z9861 Coronary angioplasty status: Secondary | ICD-10-CM | POA: Diagnosis not present

## 2017-06-06 DIAGNOSIS — I209 Angina pectoris, unspecified: Secondary | ICD-10-CM

## 2017-06-06 MED ORDER — ROSUVASTATIN CALCIUM 10 MG PO TABS: 10.0000 mg | ORAL_TABLET | Freq: Every day | ORAL | 3 refills | Status: DC

## 2017-06-06 NOTE — Progress Notes (Signed)
Cardiology Office Note:    Date:  06/06/2017   ID:  Brian Hudson, DOB 08-17-1950, MRN [id number]  PCP:  Josetta Huddle, MD  Cardiologist:  Candee Furbish, MD    Referring MD: Josetta Huddle, MD     History of Present Illness:    Brian Hudson is a 67 y.o. male with coronary artery disease status post recent obtuse marginal stent placement on 05/23/17 just beyond previously placed stent here for follow-up.  He is doing quite well.   During a prior workup underwent nuclear stress test at the request of Brian Kicks, NP that demonstrated a markedly abnormal exercise treadmill. Marked ST segment depression, 2 mm diffuse. AVR elevation. His nuclear imaging was normal however. EF 53%. No perfusion defects. No transient ischemic dilatation.  Because of these findings, he ended up having a cardiac catheterization which did reveal significant CAD and resulted in DES to proximal left circumflex, and Cutting Balloon distal OM.  The most recent cardiopulmonary stress test was performed because of coronary calcification seen on CT scan. [name] also had seen him and noted that he did not desaturate. He was concerned about possible chronotropic incompetence. His heart rate at baseline was in the 40s.  His father had heart attack at age 21. Coronary calcifications are noted in the LAD and circumflex arteries.  05/15/17 -He saw [name] who performed a cardiopulmonary function study and this demonstrated ST segment depression once again on the ECG portion with some difficulty getting his heart rate increased.  Could be medication related.  With his ongoing dyspnea symptoms, we have decided to pursue a cardiac catheterization.  06/06/17 -Underwent cardiac catheterization, new stent placed in obtuse marginal just beyond previous stent.  He states that he feels much better.  Decreased anginal symptoms.  No bleeding, no syncope.  The below for details.  Past Medical History:  Diagnosis Date  . Anxiety   .  CAD (coronary artery disease)    a. 09/20/16: 45% OM, 99% ostial ramus s/p DES, 80% proximal LCx s/p DES, 80% more distal LCx on AV groove portion of the circumflex after OM1 s/p cutting balloon and PCTA.   Marland Kitchen Concussion   . DM (diabetes mellitus) ([place])    type 2  . ED (erectile dysfunction)   . Fracture 2010   L 3 and L 4 healed with brace  . GERD (gastroesophageal reflux disease)   . HTN (hypertension)   . Hyperlipidemia   . IBS (irritable bowel syndrome)   . Palpitations    none in last few yrs  . Prostate cancer ([place]) 2007  . Sigmoid diverticulosis   . Wegener's granulomatosis ([place])    followed by dr Berna Bue    Past Surgical History:  Procedure Laterality Date  . COLONOSCOPY N/A 06/24/2016   Procedure: COLONOSCOPY with propofol;  Surgeon: Brian Fair, MD;  Location: [facility];  Service: Endoscopy;  Laterality: N/A;  . colonscopy  2008  . CORONARY STENT INTERVENTION N/A 09/20/2016   Procedure: Coronary Stent Intervention;  Surgeon: Brian Man, MD;  Location: [facility];  Service: Cardiovascular;  Laterality: N/A;  . CORONARY STENT INTERVENTION N/A 05/23/2017   Procedure: CORONARY STENT INTERVENTION;  Surgeon: Brian Blanks, MD;  Location: [facility];  Service: Cardiovascular;  Laterality: N/A;  . INSERTION PROSTATE RADIATION SEED  2007  . INTRAVASCULAR PRESSURE WIRE/FFR STUDY N/A 09/20/2016   Procedure: Intravascular Pressure Wire/FFR Study;  Surgeon: Brian Man, MD;  Location: [facility];  Service: Cardiovascular;  Laterality: N/A;  . LEFT HEART CATH AND CORONARY ANGIOGRAPHY N/A 09/20/2016   Procedure: Left Heart Cath and Coronary Angiography;  Surgeon: Brian Man, MD;  Location: [place] [facility];  Service: Cardiovascular;  Laterality: N/A;  . LEFT HEART CATH AND CORONARY ANGIOGRAPHY N/A 05/23/2017   Procedure: LEFT HEART CATH AND CORONARY ANGIOGRAPHY;  Surgeon: Brian Blanks, MD;  Location: [place] CV  LAB;  Service: Cardiovascular;  Laterality: N/A;  . ROTATOR CUFF REPAIR Right     Current Medications: Current Meds  Medication Sig  . acetaminophen (TYLENOL) 325 MG tablet Take 650 mg by mouth daily.   Marland Kitchen amLODipine (NORVASC) 5 MG tablet Take 1 tablet (5 mg total) by mouth daily.  Marland Kitchen aspirin EC 81 MG tablet Take 1 tablet (81 mg total) by mouth daily. (Patient taking differently: Take 81 mg by mouth at bedtime. )  . budesonide-formoterol (SYMBICORT) 160-4.5 MCG/ACT inhaler Inhale 2 puffs into the lungs 2 (two) times daily.  . clopidogrel (PLAVIX) 75 MG tablet Take 1 tablet (75 mg total) by mouth daily.  Marland Kitchen escitalopram (LEXAPRO) 10 MG tablet Take 10 mg by mouth daily.  . hydrocortisone cream 1 % Apply 1 application topically daily as needed for itching.  . metFORMIN (GLUCOPHAGE-XR) 500 MG 24 hr tablet Take 500 mg by mouth 2 (two) times daily after a meal.   . Multiple Vitamin (MULTIVITAMIN WITH MINERALS) TABS tablet Take 1 tablet by mouth daily.  . nitroGLYCERIN (NITROSTAT) 0.4 MG SL tablet Place 1 tablet (0.4 mg total) under the tongue every 5 (five) minutes as needed for chest pain.  . pantoprazole (PROTONIX) 40 MG tablet Take 1 tablet (40 mg total) by mouth 2 (two) times daily.  Marland Kitchen SITagliptin Phosphate (JANUVIA PO) Take by mouth daily.  . sodium chloride (OCEAN) 0.65 % SOLN nasal spray Place 1 spray into both nostrils as needed for congestion.  . [DISCONTINUED] rosuvastatin (CRESTOR) 5 MG tablet Take 5 mg by mouth daily.     Allergies:   Codeine; Lipitor [atorvastatin]; Zetia [ezetimibe]; and Zocor [simvastatin]   Social History   Socioeconomic History  . Marital status: Divorced    Spouse name: None  . Number of children: None  . Years of education: None  . Highest education level: None  Social Needs  . Financial resource strain: None  . Food insecurity - worry: None  . Food insecurity - inability: None  . Transportation needs - medical: None  . Transportation needs -  non-medical: None  Occupational History  . Occupation: Chief Strategy Officer  Tobacco Use  . Smoking status: Former Smoker    Packs/day: 3.00    Years: 35.00    Pack years: 105.00    Types: Cigarettes    Last attempt to quit: 04/09/1991    Years since quitting: 26.1  . Smokeless tobacco: Never Used  Substance and Sexual Activity  . Alcohol use: No    Alcohol/week: 0.0 oz  . Drug use: Yes    Types: Marijuana    Comment:   marijuana last used 2- weeks ago as of 06-20-16  . Sexual activity: None  Other Topics Concern  . None  Social History Narrative  . None     Family History: The patient's family history includes CAD in his father; Cancer in his brother; Diabetic kidney disease in his mother; Healthy in his sister and sister; Heart attack (age of onset: 56) in his father. ROS:   Please see the history  of present illness.    All other review of systems negative.  No bleeding, no syncope, no orthopnea.  EKGs/Labs/Other Studies Reviewed:    The following studies were reviewed today: Nuclear stress test as described above. Markedly abnormal ETT with ST segment depression. Perfusion images are normal.  Cath 4/78/29:  LV end diastolic pressure is normal.  1st Mrg lesion, 45 %stenosed.  Ost Ramus lesion, 99 %stenosed. Vessel is less than 1.5 mm  Culprit lesion: Bifurcation Cx-OM1 (1,1,1) lesion  A STENT SYNERGY DES 2.25X12 drug eluting stent was successfully placed.  Prox Cx-1 lesion, 80 %stenosed involving Ost 1st Mrg lesion, 80 %stenosed. ([place] 1,1,1)  A STENT SYNERGY DES 2.25X12 drug eluting stent was successfully placed crossing from the main Cx into OM1. Postdilated to 2.5 mm  Post intervention, there is a 0% residual stenosis.  Prox Cx-2 lesion, 80 %stenosed in the follow on AV groove portion of the circumflex after OM1. The bifurcation lesion.  Post Cutting Balloon then post PCI of OM PTCA intervention, there is a 10% residual stenosis.   Successful bifurcation PCI/PTCA  of the circumflex into OM1 and into the AV groove circumflex for PTCA. OM1 is a larger vessel than the follow on AV groove circumflex proximal therefore decided to stent into the OM and PTCA the ostium.  Mild to moderate disease elsewhere. Normal LVEDP.  Plan:  Patient will need to be on aspirin plus Plavix for at least 6 months. I used a Synergy stent in order to allow for potentially safe stopping of the Plavix if necessary for Wegener's-related bleeding.  Patient was transferred back to the short stay holding area for TR band removal. If stable after bedrest, he would be ready for discharge today as per same day discharge planning.  Defer further management to Dr. Marlou Porch.  The patient is intolerant of statins, and is bradycardic at baseline precluding beta blocker use.    Glenetta Hew, M.D., M.S.  Cardiac cath 05/23/17: 1. Non-obstructive diffuse disease in the LAD 2. Severe stenosis very small caliber intermediate branch. Unchanged from last cath and too small for PCI 3. Severe stenosis OM1 just beyond old stent. Successful PTCA/DES x 1 OM1.  4. Non-obstructive disease in the large dominant RCA 5. Normal LV systolic function.   Recommendations: Continue ASA, Plavix and statin. Same day discharge if stable.    Barium swallow: 04/17/17 Fluoroscopic evaluation of swallowing demonstrates disruption of 3 out of 4 primary esophageal peristaltic waves. There is a small hiatal hernia. Mild mucosal ring in the distal esophagus. No reflux with the water siphon maneuver. The patient swallowed a 13 mm barium tablet which freely passed into the stomach.  IMPRESSION: Nonspecific esophageal motility disorder.  Small hiatal hernia.  Mild mucosal ring in the distal esophagus. The 13 mm barium tablet freely passes into the stomach.  Cardiopulmonary exercise test: 05/12/17:  Conclusion: Exercise testing with gas exchange demonstrates low-normal functional capacity when compared to  matched sedentary norms. There is no indication for ventilatory limitation. Several parameters indicate and suggest cardiovascular ischemia is a likely the primary factor in patient's intolerance. There was ST-depression in the absence of chest pain or dizziness at higher intensity exercise and at peak workload. There was a hypertensive response and chronotropic incompetence simultaneously during exercise. Study should be clinically correlated accordingly.   EKG:  EKG is not ordered today. Baseline ECG from exercise treadmill test shows sinus rhythm heart rate 49. Personally viewed.  Recent Labs: 09/06/2016: ALT 20; TSH 0.873 05/15/2017: BUN 8; Creatinine,  Ser 1.08; Hemoglobin 12.6; Platelets 180; Potassium 4.4; Sodium 139   Recent Lipid Panel    Component Value Date/Time   CHOL 185 09/06/2016 0912   TRIG 89 09/06/2016 0912   HDL 33 (L) 09/06/2016 0912   CHOLHDL 5.6 (H) 09/06/2016 0912   LDLCALC 134 (H) 09/06/2016 0912    Physical Exam:    VS:  BP 138/70   Pulse (!) 51   Ht 5\' 9"  (1.753 m)   Wt 168 lb 6.4 oz (76.4 kg)   SpO2 99%   BMI 24.87 kg/m     Wt Readings from Last 3 Encounters:  06/06/17 168 lb 6.4 oz (76.4 kg)  05/23/17 169 lb (76.7 kg)  05/15/17 168 lb 4 oz (76.3 kg)     GEN: Well nourished, well developed, in no acute distress  HEENT: normal  Neck: no JVD, carotid bruits, or masses Cardiac: RRR; no murmurs, rubs, or gallops,no edema  Respiratory:  clear to auscultation bilaterally, normal work of breathing GI: soft, nontender, nondistended, + BS MS: no deformity or atrophy, normal pulses Skin: warm and dry, no rash Neuro:  Alert and Oriented x 3, Strength and sensation are intact Psych: euthymic mood, full affect     ASSESSMENT:    1. Coronary artery disease with angina pectoris, unspecified vessel or lesion type, unspecified whether native or transplanted heart ([place])   2. Hyperlipidemia, unspecified hyperlipidemia type   3. Angina, class III ([place])   4. S/P  PTCA (percutaneous transluminal coronary angioplasty)   5. Mixed hyperlipidemia    PLAN:    In order of problems listed above:  Coronary artery disease status post circumflex stent June 2018 with angina -   With his ongoing symptoms, mostly shortness of breath and central chest pain , and recent cardiopulmonary function study that was abnormal resulting in repeat catheterization on 05/23/17 which PCI to circumflex once again was performed with lesion just distal to prior circumflex stent.  He states that this has improved his anginal symptoms.  At first he said no to cardiac rehab but now he would like to do it.  Would feel safer there.  Dysphagia  - He is also battling GERD but the doubling up of Protonix seems to have helped. Barium swallow as above. Continue to follow up GI.  No changes.  Coronary artery calcification  - LAD and circumflex, continue with Crestor but increasing the dose from 5-10,  LDL goal less than 70.  Last LDL 90 in October 2018.  He has had trouble with Zetia and Zocor and Lipitor in the past.  He is not felt any problems with Crestor thus far. Recheck lipids in 2 months.   Diabetes  -Aggressive prevention.  Goal hemoglobin A1c less than 7.  Last check 8.5.  October 2018.  He held his metformin because of his vomiting issues.  Januvia replaced it.  He is going to talk with Dr. Inda Merlin.  Edema with rash  - amlodipine cut from 10 to 5 and this helped with his lower extremity edema and rash.  He still using some cortisone cream on his lower extremity.  History of Wegener's - [name] doubts that his sinus issue was due to [facility].  Do not know why he has a chronic sinus issue however.  He also has a nodule of left lung which is not think his Wegener's.  Following with CT.    Lori in 6 months, me 12   Medication Adjustments/Labs and Tests Ordered: Current medicines are  reviewed at length with the patient today.  Concerns regarding medicines are outlined above.  Labs and tests ordered and medication changes are outlined in the patient instructions below:  Patient Instructions  Medication Instructions:  Your physician has recommended you make the following change in your medication:   INCREASE: Crestor to 10 mg daily  Labwork: Your physician recommends that you return for a FASTING lipid profile and ALT on 08/06/17   Testing/Procedures: None ordered  Follow-Up: Your physician wants you to follow-up in: 6 months with Truitt Merle, NP. You will receive a reminder letter in the mail two months in advance. If you don't receive a letter, please call our office to schedule the follow-up appointment.  Your physician wants you to follow-up in: 1 year with Dr. Marlou Porch.  You will receive a reminder letter in the mail two months in advance. If you don't receive a letter, please call our office to schedule the follow-up appointment.   Any Other Special Instructions Will Be Listed Below (If Applicable).     If you need a refill on your cardiac medications before your next appointment, please call your pharmacy.     Signed, Candee Furbish, MD  06/06/2017 9:42 AM    [facility]

## 2017-06-06 NOTE — Patient Instructions (Addendum)
Medication Instructions:  Your physician has recommended you make the following change in your medication:   INCREASE: Crestor to 10 mg daily  Labwork: Your physician recommends that you return for a FASTING lipid profile and ALT on 08/06/17   Testing/Procedures: None ordered  Follow-Up: Your physician wants you to follow-up in: 6 months with Truitt Merle, NP. You will receive a reminder letter in the mail two months in advance. If you don't receive a letter, please call our office to schedule the follow-up appointment.  Your physician wants you to follow-up in: 1 year with Dr. Marlou Porch.  You will receive a reminder letter in the mail two months in advance. If you don't receive a letter, please call our office to schedule the follow-up appointment.   Any Other Special Instructions Will Be Listed Below (If Applicable).     If you need a refill on your cardiac medications before your next appointment, please call your pharmacy.

## 2017-06-13 ENCOUNTER — Ambulatory Visit: Payer: Medicare Other | Admitting: Internal Medicine

## 2017-06-13 ENCOUNTER — Encounter: Payer: Self-pay | Admitting: Internal Medicine

## 2017-06-13 VITALS — BP 104/60 | HR 50 | Ht 69.0 in | Wt 164.2 lb

## 2017-06-13 DIAGNOSIS — R911 Solitary pulmonary nodule: Secondary | ICD-10-CM | POA: Diagnosis not present

## 2017-06-13 DIAGNOSIS — R0689 Other abnormalities of breathing: Secondary | ICD-10-CM | POA: Diagnosis not present

## 2017-06-13 DIAGNOSIS — R0600 Dyspnea, unspecified: Secondary | ICD-10-CM

## 2017-06-13 DIAGNOSIS — Z8679 Personal history of other diseases of the circulatory system: Secondary | ICD-10-CM | POA: Diagnosis not present

## 2017-06-13 NOTE — Patient Instructions (Signed)
History of Wegener's granulomatosis Currently no evidence of recurrence  Nodule of left lung  - do not think this is due to wegner because wegner blood work dec 2018 and PET jan 2019 show is shrinking compared to Dec 2018 - do repeat ct chest wo contrast Dec 2019  Dyspnea and respiratory abnormalities  - improved after placement of 2nd stent  Followup Dec 2019 after CT chest; sooner if needed

## 2017-06-13 NOTE — Progress Notes (Signed)
Subjective:     Patient ID: Brian Hudson, male   DOB: [birth year], 67 y.o.   MRN: [id number]  HPI PCP Josetta Huddle, MD   IOV 08/21/2016  Chief Complaint  Patient presents with  . Pulm Consult    Referred by Leafy Kindle PA-C for vasculitis.    67 year old male referred by Dr. Gavin Pound and of physician assistant for evaluation of shortness of breath in the setting of previous diagnosis of Wegner's. There is very little outside information but in terms of his Wegener's granulomatosis he tells me that he was diagnosed with this in 2004 following significant sinus symptoms including bloody sinus drainage. This was at [facility]. He was treated for approximately one year with Cytoxan and subsequently on maintenance methotrexate. His last treatment for Wegener's was in 2008. Since then he's been on observation therapy. Treatment Back then  included prednisone as well. Then approximately 2 or 3 years ago he switched care to Dr. Gavin Pound and rheumatology. According to him his been on observation therapy there as well. He tells me now for the last year or 2 he's been more fatigue associated with shortness of breath. He used to walk 2 miles a day but now is unable to do that. Dyspnea is present all the time but definitely more so with exertion and relieved by rest. There is associated cough and sometimes hemoptysis especially early in the morning but this is been ongoing for several years. He thinks his Wegener's might be active. He believes Dr. Gavin Pound is done some blood work to look for vasculitis activity but I do not have those results with me. A CT scan of the chest was done that showed left upper lobe groundglass opacity and therefore he is been here. He denies any chest pain     FeNO 34ppb 08/21/2016 and slightly high  Walking desaturation test on 08/21/2016 185 feet x 3 laps on RA:  did NOT desaturate. Rest pulse ox was 100%, final pulse ox was 90. HR response 50/min at  rest to 62/min at peak exertion.    IMPRESSION: CT scan of the chest personally visualized 1. No acute abnormality is seen on CT of the chest. 2. Coronary artery calcifications. 3. Minimal ground-glass opacity in the left upper lobe of doubtful significance. Initial follow-up with CT at 6-12 months is recommended to confirm persistence. If persistent, repeat CT is recommended every 2 years until 5 years of stability has been established. This recommendation follows the consensus statement: Guidelines for Management of Incidental Pulmonary Nodules Detected on CT Images: From the Fleischner Society 2017; Radiology 2017; 284:228-243.   Electronically Signed   By: Ivar Drape M.D.   On: 06/11/2016 17:14   09/11/2016 PFT' Follow Up: Brian Hudson is a 67 y.o. male former smoker quit in 1994 with history of Wegner's Disease since 2004, and dyspnea on exertion.  Pt. Was seen by [name] 08/21/2016 for  dyspnea .Plan at that time was as follows: PLAN  - start symbicort 2 puff twice daily - take sample worth 1 month  - do ono test room air next few to several days  - refer cardiology  - [name] or [facility] first available - sign record releast to get more info on wegner from [name] esp blood work  - do full PFT  Patient presents today for follow-up. Patient has been compliant with Symbicort since 08/21/2016. He states he cannot tell any significant difference. In his dyspnea. He states  he continues to get short of breath at times with exercise and also when talking. He states his shortness of breath does not wake him from sleep. He continues to be unable to walk as he has done in the past. He was seen by [name] at [facility] heart care on 09/06/2016. Of note there is a significant family history which includes coronary artery disease in his father, who had MI at the age of 60.Risk factors include CAD, DM, HLD, + FH premature CAD, hx tobacco use and coronary calcifications on CT of  chest.The patient is Scheduled for an exercise stress test 6/14 and an event monitor 6/14 to rule out tachy bradycardia syndrome. He states he does have secretions in the morning, yellowish with occasional blood tinged secretions, that self resolves..Lab work has not been sent from Dr. Trudie Reed [place], therefore I am unable to review. Patient denies any fever, chest pain, orthopnea, or hemoptysis.   Test Results:  CT angiogram: 06/11/2016 No acute abnormality is seen on CT of the chest. 2. Coronary artery calcifications. 3. Minimal ground-glass opacity in the left upper lobe of doubtful significance. Initial follow-up with CT at 6-12 months is recommended to confirm persistence  Ambulatory saturation: 08/21/2016 185 feet 3 laps on room air. Did not desaturate. Resting pulse ox was 100%. Final pulse ox was 90%. Heart rate response 50/m at rest is 62/m at peak exertion.  EKG 09/06/2016 Sinus bradycardia with poor R-wave progression, heart rate 44   OV 03/24/2017 Chief Complaint  Patient presents with  . Follow-up    CT scan 03/14/17.  Pt states that he still has some tightness in chest, has occ coughing, and is SOB all the time.  Pt also states that he has gotten choked several times while trying to eat.   Remote Wegner's granulomatosis in 2004 status post therapy. Remote smoking history  Follow-up shortness of breath: eing seen in pulmonary clinic for shortness of breath. Earlier this year we referred him to cardiology on basis of coronary artery calcification and strong family history. He status post coronary artery stent 2. After that his cardiac rehabilitation. Dyspnea is only some better he still has residual dyspnea  Follow-up left upper lobe lung nodule is a groundglass opacity. He had follow-up CT scan of the chest without contrast 03/14/2017. This is documented below. It appears the ground glass opacity persists but it is also "close to 2 cm. However he says that his [name]  is under remission. He did see Dr. Trudie Reed earlier this year and apparently blood work was normal.  New issue: Is complaining of nonspecific dysphagia 4 times in the last 2 weeks. It is for solids. He is pretty significant. No associated vomiting or aspiration. His sister confirms the same.  IMPRESSION: ct chest 03/14/17 Persistent 12 x 19 mm ground-glass opacity in the central left upper Lobe. Adenocarcinoma cannot be excluded. Follow up by CT is recommended in 12 months, with continued annual surveillance for a minimum of 3 Years. These recommendations are taken from: Recommendations for the Management of Subsolid Pulmonary Nodules Detected at CT: A Statement from the [employer] 2013; 266:1, [phone].  Aortic Atherosclerosis (ICD10-I70.0).   Electronically Signed   By: Julian Hy M.D.   On: 03/14/2017 09:16   OV 04/29/2017  Chief Complaint  Patient presents with  . Follow-up    PET scan done 04/11/17.  Pt has complaints of coughing (non-productive), shortness of breath     Follow-up shortness of breath in this  patient with remote [place] history and remote smoking history Follow-up left lower lobe lung nodule  In terms of shortness of breath: He is compliant with his Symbicort for empiric asthma treatment.  He had coronary artery stents in the middle of 2018.  Despite this he is short of breath.  He says this frustrates him significantly.  He tells me that he is able to walk a block or climb steps without a problem or less of a problem.  And otherwise he does not been otherwise he does have some dyspnea on exertion for these activities.  But the significant amount of dyspnea happens when he is talking a lot.  He feels this is Wegener's dj vu again.  He did have Wegener's serology and did see Dr. Trudie Reed in rheumatology end of 2018 and there is no evidence of Wegener's recurrence but he still feels this is white not measurable.  He has an appointment upcoming  with Dr. Candee Furbish his cardiologist.  In terms of left lower lobe lung nodule groundglass opacity: This was noted December 2018 CT chest.  He had a follow-up CT PET in January 2019 that showed this nodule was beginning to shrink.  His Wegner antibodies were negative/trace positive.  He has been reassured.  He requires a follow-up CT scan end of 2019.  New issue: Is complaining of chronic sinus discharge for months.  He feels is all Wegener's again.  I have repeatedly told him that so far we do not have evidence of recurrence.  OV 06/13/2017  Chief Complaint  Patient presents with  . Follow-up    lung nodule, no SOB, no Wheezing, No chest tightness    Follow-up dyspnea: In terms of dyspnea he had a cardiopulmonary stress test that suggested coronary issues.  He then had a cardiac cath in February 2019 and the place a second coronary stent.  He tells me now that his dyspnea is better.  In fact when he talks he gets dyspnea and even this is better but he still has residual dyspnea.  He is trying to get into cardiac rehab and is waiting to hear from them.  There is no wheezing or cough  In terms of left lower lobe lung nodule groundglass opacity: He has follow-up scan pending in December 2019.  There are no other new issues.    has a past medical history of Anxiety, CAD (coronary artery disease), Concussion, DM (diabetes mellitus) ([place]), ED (erectile dysfunction), Fracture (2010), GERD (gastroesophageal reflux disease), HTN (hypertension), Hyperlipidemia, IBS (irritable bowel syndrome), Palpitations, Prostate cancer ([place]) (2007), Sigmoid diverticulosis, and Wegener's granulomatosis ([place]).   reports that he quit smoking about 26 years ago. His smoking use included cigarettes. He has a 105.00 pack-year smoking history. he has never used smokeless tobacco.  Past Surgical History:  Procedure Laterality Date  . COLONOSCOPY N/A 06/24/2016   Procedure: COLONOSCOPY with propofol;  Surgeon: Garlan Fair, MD;  Location: [facility];  Service: Endoscopy;  Laterality: N/A;  . colonscopy  2008  . CORONARY STENT INTERVENTION N/A 09/20/2016   Procedure: Coronary Stent Intervention;  Surgeon: Leonie Man, MD;  Location: [place] [facility];  Service: Cardiovascular;  Laterality: N/A;  . CORONARY STENT INTERVENTION N/A 05/23/2017   Procedure: CORONARY STENT INTERVENTION;  Surgeon: Burnell Blanks, MD;  Location: [place] [facility];  Service: Cardiovascular;  Laterality: N/A;  . INSERTION PROSTATE RADIATION SEED  2007  . INTRAVASCULAR PRESSURE WIRE/FFR STUDY N/A 09/20/2016   Procedure: Intravascular Pressure Wire/FFR  Study;  Surgeon: Leonie Man, MD;  Location: [place] [facility];  Service: Cardiovascular;  Laterality: N/A;  . LEFT HEART CATH AND CORONARY ANGIOGRAPHY N/A 09/20/2016   Procedure: Left Heart Cath and Coronary Angiography;  Surgeon: Leonie Man, MD;  Location: [place] [facility];  Service: Cardiovascular;  Laterality: N/A;  . LEFT HEART CATH AND CORONARY ANGIOGRAPHY N/A 05/23/2017   Procedure: LEFT HEART CATH AND CORONARY ANGIOGRAPHY;  Surgeon: Burnell Blanks, MD;  Location: [place] [facility];  Service: Cardiovascular;  Laterality: N/A;  . ROTATOR CUFF REPAIR Right     Allergies  Allergen Reactions  . Codeine Other (See Comments)    "jittery"  . Lipitor [Atorvastatin] Other (See Comments)    arthralgia  . Zetia [Ezetimibe] Other (See Comments)    arthralgia  . Zocor [Simvastatin] Other (See Comments)    arthralgia    Immunization History  Administered Date(s) Administered  . Influenza, High Dose Seasonal PF 01/06/2017  . Pneumococcal Polysaccharide-23 02/19/2012    Family History  Problem Relation Age of Onset  . Heart attack Father 68  . CAD Father   . Diabetic kidney disease Mother   . Healthy Sister   . Cancer Brother   . Healthy Sister      Current Outpatient Medications:  .  acetaminophen (TYLENOL) 325 MG tablet, Take 650 mg  by mouth daily. , Disp: , Rfl:  .  amLODipine (NORVASC) 5 MG tablet, Take 1 tablet (5 mg total) by mouth daily., Disp: 90 tablet, Rfl: 3 .  aspirin EC 81 MG tablet, Take 1 tablet (81 mg total) by mouth daily. (Patient taking differently: Take 81 mg by mouth at bedtime. ), Disp: 30 tablet, Rfl: 0 .  budesonide-formoterol (SYMBICORT) 160-4.5 MCG/ACT inhaler, Inhale 2 puffs into the lungs 2 (two) times daily., Disp: 1 Inhaler, Rfl: 5 .  clopidogrel (PLAVIX) 75 MG tablet, Take 1 tablet (75 mg total) by mouth daily., Disp: 90 tablet, Rfl: 3 .  escitalopram (LEXAPRO) 10 MG tablet, Take 10 mg by mouth daily., Disp: , Rfl:  .  hydrocortisone cream 1 %, Apply 1 application topically daily as needed for itching., Disp: , Rfl:  .  metFORMIN (GLUCOPHAGE-XR) 500 MG 24 hr tablet, Take 500 mg by mouth 2 (two) times daily after a meal. , Disp: , Rfl:  .  Multiple Vitamin (MULTIVITAMIN WITH MINERALS) TABS tablet, Take 1 tablet by mouth daily., Disp: , Rfl:  .  nitroGLYCERIN (NITROSTAT) 0.4 MG SL tablet, Place 1 tablet (0.4 mg total) under the tongue every 5 (five) minutes as needed for chest pain., Disp: 28 tablet, Rfl: 3 .  pantoprazole (PROTONIX) 40 MG tablet, Take 1 tablet (40 mg total) by mouth 2 (two) times daily., Disp: 60 tablet, Rfl: 2 .  rosuvastatin (CRESTOR) 10 MG tablet, Take 1 tablet (10 mg total) by mouth daily., Disp: 90 tablet, Rfl: 3 .  SITagliptin Phosphate (JANUVIA PO), Take by mouth daily., Disp: , Rfl:  .  sodium chloride (OCEAN) 0.65 % SOLN nasal spray, Place 1 spray into both nostrils as needed for congestion., Disp: , Rfl:    Review of Systems     Objective:   Physical Exam  Constitutional: He is oriented to person, place, and time. He appears well-developed and well-nourished. No distress.  HENT:  Head: Normocephalic and atraumatic.  Right Ear: External ear normal.  Left Ear: External ear normal.  Mouth/Throat: Oropharynx is clear and moist. No oropharyngeal exudate.  Eyes:  Conjunctivae and EOM  are normal. Pupils are equal, round, and reactive to light. Right eye exhibits no discharge. Left eye exhibits no discharge. No scleral icterus.  Neck: Normal range of motion. Neck supple. No JVD present. No tracheal deviation present. No thyromegaly present.  Cardiovascular: Normal rate, regular rhythm and intact distal pulses. Exam reveals no gallop and no friction rub.  No murmur heard. Pulmonary/Chest: Effort normal and breath sounds normal. No respiratory distress. He has no wheezes. He has no rales. He exhibits no tenderness.  Abdominal: Soft. Bowel sounds are normal. He exhibits no distension and no mass. There is no tenderness. There is no rebound and no guarding.  Musculoskeletal: Normal range of motion. He exhibits no edema or tenderness.  Lymphadenopathy:    He has no cervical adenopathy.  Neurological: He is alert and oriented to person, place, and time. He has normal reflexes. No cranial nerve deficit. Coordination normal.  Skin: Skin is warm and dry. No rash noted. He is not diaphoretic. No erythema. No pallor.  Psychiatric: He has a normal mood and affect. His behavior is normal. Judgment and thought content normal.  Nursing note and vitals reviewed.  Vitals:   06/13/17 1027 06/13/17 1028  BP:  104/60  Pulse:  (!) 50  SpO2:  100%  Weight: 164 lb 3.2 oz (74.5 kg)   Height: 5\' 9"  (1.753 m)     Estimated body mass index is 24.25 kg/m as calculated from the following:   Height as of this encounter: 5\' 9"  (1.753 m).   Weight as of this encounter: 164 lb 3.2 oz (74.5 kg).      Assessment:       ICD-10-CM   1. History of Wegener's granulomatosis Z86.79   2. Nodule of left lung R91.1   3. Dyspnea and respiratory abnormalities R06.00    R06.89        Plan:     History of Wegener's granulomatosis Currently no evidence of recurrence  Nodule of left lung  - do not think this is due to wegner because wegner blood work dec 2018 and PET jan 2019 show  is shrinking compared to Dec 2018 - do repeat ct chest wo contrast Dec 2019  Dyspnea and respiratory abnormalities  - improved after placement of 2nd stent  Followup Dec 2019 after CT chest; sooner if needed  Dr. Brand Males, M.D., [facility].C.P Pulmonary and Critical Care Medicine Staff Physician, [place] Director - Interstitial Lung Disease  Program  Pulmonary [place] at [place], [place], [address]  Pager: [phone], If no answer or between  15:00h - 7:00h: call [phone] Telephone: [phone]

## 2017-06-16 ENCOUNTER — Telehealth (HOSPITAL_COMMUNITY): Payer: Self-pay

## 2017-06-16 NOTE — Telephone Encounter (Signed)
Sister of patient called and stated patient has changed his mind and would like to Participate in Cardiac Rehab. Scheduled orientation on 08/07/2017 at 8:30am. Patient will attend the 9:45am exc class. Mailed packet.

## 2017-06-18 ENCOUNTER — Telehealth (HOSPITAL_COMMUNITY): Payer: Self-pay

## 2017-06-18 NOTE — Telephone Encounter (Signed)
Called sister of patient to inform her that we had a cancellation in our orientation schedule and we could get patient in on 07/08/2017. Sister of patient stated that was great. Rescheduled orientation to 07/08/2017 at 8:30am.

## 2017-06-20 ENCOUNTER — Telehealth: Payer: Self-pay | Admitting: Internal Medicine

## 2017-06-20 NOTE — Telephone Encounter (Signed)
Received a PA request from [facility] in [place] for patient's Symbicort 160.   PA has been started via [email or web address]. Key is [id number].   Per CMM.com, [employer] will notify the office within 3 business days with a decision.   Will route to [place].

## 2017-06-23 DIAGNOSIS — E1165 Type 2 diabetes mellitus with hyperglycemia: Secondary | ICD-10-CM | POA: Diagnosis not present

## 2017-06-23 DIAGNOSIS — D509 Iron deficiency anemia, unspecified: Secondary | ICD-10-CM | POA: Diagnosis not present

## 2017-06-23 DIAGNOSIS — I10 Essential (primary) hypertension: Secondary | ICD-10-CM | POA: Diagnosis not present

## 2017-06-23 DIAGNOSIS — I2510 Atherosclerotic heart disease of native coronary artery without angina pectoris: Secondary | ICD-10-CM | POA: Diagnosis not present

## 2017-06-23 NOTE — Telephone Encounter (Signed)
Per CMM.com, PA not needed for Symbicort since the patient is not using more than the prescribed amount.   [facility] in [place] is aware. Patient has already picked up medication for $37. Nothing else needed.

## 2017-06-25 DIAGNOSIS — E1165 Type 2 diabetes mellitus with hyperglycemia: Secondary | ICD-10-CM | POA: Diagnosis not present

## 2017-07-04 ENCOUNTER — Telehealth (HOSPITAL_COMMUNITY): Payer: Self-pay

## 2017-07-07 NOTE — Telephone Encounter (Signed)
Cardiac Rehab Medication Review by a Pharmacist  Does the patient  feel that his/her medications are working for him/her?  yes  Has the patient been experiencing any side effects to the medications prescribed?  no  Does the patient measure his/her own blood pressure or blood glucose at home?  no   Does the patient have any problems obtaining medications due to transportation or finances?   no  Understanding of regimen: fair Understanding of indications: fair Potential of compliance: good    Pharmacist comments: Brian Hudson is a pleasant 67 y/o M whom I spoke with regarding his medications prior to his upcoming cardiac rehab appointment. He states that he has not felt this good in 2 to 3 years and that the medications and treatments seem to be working well for him. He does have difficulty recalling if he took his medicines or which ones he took, so someone helps him with his medicines and fills his pill boxes. He denies any side effects or concerns with the medications.  Patterson Hammersmith PharmD PGY1 Pharmacy Practice Resident 07/07/2017 5:30 PM Pager: [phone]

## 2017-07-08 ENCOUNTER — Encounter (HOSPITAL_COMMUNITY): Payer: Self-pay

## 2017-07-08 ENCOUNTER — Encounter (HOSPITAL_COMMUNITY)
Admission: RE | Admit: 2017-07-08 | Discharge: 2017-07-08 | Disposition: A | Discharge status: Home / Self Care | Payer: Medicare Other | Source: Ambulatory Visit | Attending: Cardiology | Admitting: Cardiology

## 2017-07-08 VITALS — BP 114/60 | HR 49 | Ht 69.25 in | Wt 171.3 lb

## 2017-07-08 DIAGNOSIS — K589 Irritable bowel syndrome without diarrhea: Secondary | ICD-10-CM | POA: Diagnosis not present

## 2017-07-08 DIAGNOSIS — Z7902 Long term (current) use of antithrombotics/antiplatelets: Secondary | ICD-10-CM | POA: Diagnosis not present

## 2017-07-08 DIAGNOSIS — F419 Anxiety disorder, unspecified: Secondary | ICD-10-CM | POA: Diagnosis not present

## 2017-07-08 DIAGNOSIS — I10 Essential (primary) hypertension: Secondary | ICD-10-CM | POA: Diagnosis not present

## 2017-07-08 DIAGNOSIS — Z79899 Other long term (current) drug therapy: Secondary | ICD-10-CM | POA: Insufficient documentation

## 2017-07-08 DIAGNOSIS — E119 Type 2 diabetes mellitus without complications: Secondary | ICD-10-CM | POA: Insufficient documentation

## 2017-07-08 DIAGNOSIS — Z7982 Long term (current) use of aspirin: Secondary | ICD-10-CM | POA: Insufficient documentation

## 2017-07-08 DIAGNOSIS — Z87891 Personal history of nicotine dependence: Secondary | ICD-10-CM | POA: Diagnosis not present

## 2017-07-08 DIAGNOSIS — Z8546 Personal history of malignant neoplasm of prostate: Secondary | ICD-10-CM | POA: Insufficient documentation

## 2017-07-08 DIAGNOSIS — Z955 Presence of coronary angioplasty implant and graft: Secondary | ICD-10-CM | POA: Diagnosis present

## 2017-07-08 DIAGNOSIS — I2510 Atherosclerotic heart disease of native coronary artery without angina pectoris: Secondary | ICD-10-CM | POA: Diagnosis not present

## 2017-07-08 DIAGNOSIS — K219 Gastro-esophageal reflux disease without esophagitis: Secondary | ICD-10-CM | POA: Diagnosis not present

## 2017-07-08 DIAGNOSIS — M3130 Wegener's granulomatosis without renal involvement: Secondary | ICD-10-CM | POA: Diagnosis not present

## 2017-07-08 DIAGNOSIS — Z7951 Long term (current) use of inhaled steroids: Secondary | ICD-10-CM | POA: Diagnosis not present

## 2017-07-08 DIAGNOSIS — E785 Hyperlipidemia, unspecified: Secondary | ICD-10-CM | POA: Diagnosis not present

## 2017-07-08 DIAGNOSIS — N529 Male erectile dysfunction, unspecified: Secondary | ICD-10-CM | POA: Insufficient documentation

## 2017-07-08 DIAGNOSIS — Z7984 Long term (current) use of oral hypoglycemic drugs: Secondary | ICD-10-CM | POA: Insufficient documentation

## 2017-07-08 NOTE — Progress Notes (Signed)
Brian Hudson 67 y.o. male DOB: [birth year] MRN: [id number]      Nutrition Note  Dx: DES OM1 Past Medical History:  Diagnosis Date  . Anxiety   . CAD (coronary artery disease)    a. 09/20/16: 45% OM, 99% ostial ramus s/p DES, 80% proximal LCx s/p DES, 80% more distal LCx on AV groove portion of the circumflex after OM1 s/p cutting balloon and PCTA.   Marland Kitchen Concussion   . DM (diabetes mellitus) ([place])    type 2  . ED (erectile dysfunction)   . Fracture 2010   L 3 and L 4 healed with brace  . GERD (gastroesophageal reflux disease)   . HTN (hypertension)   . Hyperlipidemia   . IBS (irritable bowel syndrome)   . Palpitations    none in last few yrs  . Prostate cancer ([place]) 2007  . Sigmoid diverticulosis   . Wegener's granulomatosis ([place])    followed by dr Berna Bue   Meds reviewed. Metformin, Januvia noted  HT: Ht Readings from Last 1 Encounters:  07/08/17 5' 9.25" (1.759 m)    WT: Wt Readings from Last 3 Encounters:  07/08/17 171 lb 4.8 oz (77.7 kg)  06/13/17 164 lb 3.2 oz (74.5 kg)  06/06/17 168 lb 6.4 oz (76.4 kg)     BMI 25.1   Current tobacco use? No  Labs:  Lipid Panel     Component Value Date/Time   CHOL 185 09/06/2016 0912   TRIG 89 09/06/2016 0912   HDL 33 (L) 09/06/2016 0912   CHOLHDL 5.6 (H) 09/06/2016 0912   LDLCALC 134 (H) 09/06/2016 0912    No results found for: HGBA1C CBG (last 3)  No results for input(s): GLUCAP in the last 72 hours.  Nutrition Note Spoke with pt. Nutrition plan and goals reviewed with pt. Pt is not currently following the Therapeutic Lifestyle Changes diet. Pt is diabetic. Last A1c indicates blood glucose not optimally controlled. Pt checks CBG's every other day. Fasting CBG's reportedly 100-180 mg/dL. Pt reports Metformin discontinued due to GI distress. Pt currently taking only Januvia to control CBG's. Pt expressed understanding of the information reviewed. Pt aware of nutrition education classes offered.  Nutrition  Diagnosis ? Food-and nutrition-related knowledge deficit related to lack of exposure to information as related to diagnosis of: ? CVD ? DM  Nutrition Intervention Pt's individual nutrition plan and goals reviewed with pt. Pt given handouts for: ? Nutrition I class ? Nutrition II class ? Diabetes Blitz Class   Nutrition Goal(s):  ? Pt to identify and limit food sources of saturated fat, trans fat, and sodium ? Pt to describe the benefit of including fruits, vegetables, whole grains, and low-fat dairy products in a heart healthy meal plan. ? Improved blood glucose control as evidenced by pt's A1c trending from 8.5 (per MD note) toward less than 7.0.   Plan:  Pt to attend nutrition classes ? Nutrition I ? Nutrition II ? Portion Distortion ? Diabetes Blitz ? Diabetes Q & A Will provide client-centered nutrition education as part of interdisciplinary care.   Monitor and evaluate progress toward nutrition goal with team.  Derek Mound, M.Ed, RD, LDN, CDE 07/08/2017 1:43 PM

## 2017-07-08 NOTE — Progress Notes (Signed)
Cardiac Individual Treatment Plan  Patient Details  Name: Brian Hudson MRN: [id number] Date of Birth: 25-Feb-1951 Referring Provider:     CARDIAC REHAB PHASE II ORIENTATION from 07/08/2017 in [place]  Referring Provider  Candee Furbish MD      Initial Encounter Date:    CARDIAC REHAB PHASE II ORIENTATION from 07/08/2017 in [place]  Date  07/08/17  Referring Provider  Candee Furbish MD      Visit Diagnosis: Status post coronary artery stent placement 05/23/17 S/P DES OM  Patient's Home Medications on Admission:  Current Outpatient Medications:  .  acetaminophen (TYLENOL) 325 MG tablet, Take 650 mg by mouth daily. , Disp: , Rfl:  .  amLODipine (NORVASC) 5 MG tablet, Take 1 tablet (5 mg total) by mouth daily., Disp: 90 tablet, Rfl: 3 .  aspirin EC 81 MG tablet, Take 1 tablet (81 mg total) by mouth daily. (Patient taking differently: Take 81 mg by mouth at bedtime. ), Disp: 30 tablet, Rfl: 0 .  budesonide-formoterol (SYMBICORT) 160-4.5 MCG/ACT inhaler, Inhale 2 puffs into the lungs 2 (two) times daily., Disp: 1 Inhaler, Rfl: 5 .  clopidogrel (PLAVIX) 75 MG tablet, Take 1 tablet (75 mg total) by mouth daily., Disp: 90 tablet, Rfl: 3 .  escitalopram (LEXAPRO) 10 MG tablet, Take 10 mg by mouth daily., Disp: , Rfl:  .  hydrocortisone cream 1 %, Apply 1 application topically daily as needed for itching., Disp: , Rfl:  .  metFORMIN (GLUCOPHAGE-XR) 500 MG 24 hr tablet, Take 500 mg by mouth 2 (two) times daily after a meal. , Disp: , Rfl:  .  Multiple Vitamin (MULTIVITAMIN WITH MINERALS) TABS tablet, Take 1 tablet by mouth daily., Disp: , Rfl:  .  nitroGLYCERIN (NITROSTAT) 0.4 MG SL tablet, Place 1 tablet (0.4 mg total) under the tongue every 5 (five) minutes as needed for chest pain., Disp: 28 tablet, Rfl: 3 .  pantoprazole (PROTONIX) 40 MG tablet, Take 1 tablet (40 mg total) by mouth 2 (two) times daily., Disp: 60 tablet, Rfl: 2 .   rosuvastatin (CRESTOR) 10 MG tablet, Take 1 tablet (10 mg total) by mouth daily., Disp: 90 tablet, Rfl: 3 .  SITagliptin Phosphate (JANUVIA PO), Take by mouth daily., Disp: , Rfl:  .  sodium chloride (OCEAN) 0.65 % SOLN nasal spray, Place 1 spray into both nostrils as needed for congestion., Disp: , Rfl:   Past Medical History: Past Medical History:  Diagnosis Date  . Anxiety   . CAD (coronary artery disease)    a. 09/20/16: 45% OM, 99% ostial ramus s/p DES, 80% proximal LCx s/p DES, 80% more distal LCx on AV groove portion of the circumflex after OM1 s/p cutting balloon and PCTA.   Marland Kitchen Concussion   . DM (diabetes mellitus) ([place])    type 2  . ED (erectile dysfunction)   . Fracture 2010   L 3 and L 4 healed with brace  . GERD (gastroesophageal reflux disease)   . HTN (hypertension)   . Hyperlipidemia   . IBS (irritable bowel syndrome)   . Palpitations    none in last few yrs  . Prostate cancer ([place]) 2007  . Sigmoid diverticulosis   . Wegener's granulomatosis ([place])    followed by dr Berna Bue    Tobacco Use: Social History   Tobacco Use  Smoking Status Former Smoker  . Packs/day: 3.00  . Years: 35.00  . Pack years: 105.00  . Types:  Cigarettes  . Last attempt to quit: 04/09/1991  . Years since quitting: 26.2  Smokeless Tobacco Never Used    Labs: Recent Review Scientist, physiological    Labs for ITP Cardiac and Pulmonary Rehab Latest Ref Rng & Units 09/06/2016   Cholestrol 100 - 199 mg/dL 185   LDLCALC 0 - 99 mg/dL 134(H)   HDL >39 mg/dL 33(L)   Trlycerides 0 - 149 mg/dL 89      Capillary Blood Glucose: Lab Results  Component Value Date   GLUCAP 172 (H) 05/23/2017   GLUCAP 200 (H) 05/23/2017   GLUCAP 210 (H) 04/11/2017   GLUCAP 167 (H) 09/20/2016   GLUCAP 135 (H) 09/20/2016     Exercise Target Goals: Date: 07/08/17  Exercise Program Goal: Individual exercise prescription set using results from initial 6 min walk test and THRR while considering  patient's activity  barriers and safety.   Exercise Prescription Goal: Initial exercise prescription builds to 30-45 minutes a day of aerobic activity, 2-3 days per week.  Home exercise guidelines will be given to patient during program as part of exercise prescription that the participant will acknowledge.  Activity Barriers & Risk Stratification: Activity Barriers & Cardiac Risk Stratification - 07/08/17 1045      Activity Barriers & Cardiac Risk Stratification   Activity Barriers  Deconditioning;Joint Problems;Back Problems;Neck/Spine Problems;Other (comment);Arthritis    Comments  R knee pain, LBP! and neck stiffness    Cardiac Risk Stratification  High       6 Minute Walk: 6 Minute Walk    Row Name 07/08/17 1017 07/08/17 1045 07/08/17 1102     6 Minute Walk   Phase  Initial  (Pended)   -  Initial   Distance  1600 feet  (Pended)   1600 feet  -   Walk Time  6 minutes  (Pended)   6 minutes  -   # of Rest Breaks  0  (Pended)   0  -   MPH  3.03  (Pended)   3  -   METS  -  3.3  -   RPE  -  9  -   VO2 Peak  -  11.5  -   Symptoms  -  No  -   Resting HR  -  49 bpm  -   Resting BP  -  114/60  -   Resting Oxygen Saturation   -  100 %  -   Exercise Oxygen Saturation  during 6 min walk  -  99 %  -   Max Ex. HR  -  67 bpm  -   Max Ex. BP  -  112/72  -   2 Minute Post BP  -  118/60  -      Oxygen Initial Assessment:   Oxygen Re-Evaluation:   Oxygen Discharge (Final Oxygen Re-Evaluation):   Initial Exercise Prescription: Initial Exercise Prescription - 07/08/17 1000      Date of Initial Exercise RX and Referring Provider   Date  07/08/17    Referring Provider  Candee Furbish MD      Treadmill   MPH  2.8    Grade  0    Minutes  10    METs  3.14      Bike   Level  0.7    Minutes  10    METs  2.65      NuStep   Level  3    SPM  80  Minutes  10    METs  2.5      Prescription Details   Frequency (times per week)  3    Duration  Progress to 30 minutes of continuous aerobic  without signs/symptoms of physical distress      Intensity   THRR 40-80% of Max Heartrate  61-122    Ratings of Perceived Exertion  11-13    Perceived Dyspnea  0-4      Progression   Progression  Continue to progress workloads to maintain intensity without signs/symptoms of physical distress.      Resistance Training   Training Prescription  Yes    Weight  3lbs    Reps  10-15       Perform Capillary Blood Glucose checks as needed.  Exercise Prescription Changes:   Exercise Comments:   Exercise Goals and Review: Exercise Goals    Row Name 07/08/17 1046             Exercise Goals   Increase Physical Activity  Yes       Intervention  Provide advice, education, support and counseling about physical activity/exercise needs.;Develop an individualized exercise prescription for aerobic and resistive training based on initial evaluation findings, risk stratification, comorbidities and participant's personal goals.       Expected Outcomes  Short Term: Attend rehab on a regular basis to increase amount of physical activity.;Long Term: Add in home exercise to make exercise part of routine and to increase amount of physical activity.;Long Term: Exercising regularly at least 3-5 days a week.       Increase Strength and Stamina  Yes improve mobility, and increase motivation to exercise       Intervention  Provide advice, education, support and counseling about physical activity/exercise needs.;Develop an individualized exercise prescription for aerobic and resistive training based on initial evaluation findings, risk stratification, comorbidities and participant's personal goals.       Expected Outcomes  Short Term: Increase workloads from initial exercise prescription for resistance, speed, and METs.;Short Term: Perform resistance training exercises routinely during rehab and add in resistance training at home;Long Term: Improve cardiorespiratory fitness, muscular endurance and strength as  measured by increased METs and functional capacity (6MWT)       Able to understand and use rate of perceived exertion (RPE) scale  Yes       Intervention  Provide education and explanation on how to use RPE scale       Expected Outcomes  Short Term: Able to use RPE daily in rehab to express subjective intensity level;Long Term:  Able to use RPE to guide intensity level when exercising independently       Knowledge and understanding of Target Heart Rate Range (THRR)  Yes       Intervention  Provide education and explanation of THRR including how the numbers were predicted and where they are located for reference       Expected Outcomes  Long Term: Able to use THRR to govern intensity when exercising independently;Short Term: Able to state/look up THRR;Short Term: Able to use daily as guideline for intensity in rehab       Able to check pulse independently  Yes       Intervention  Provide education and demonstration on how to check pulse in carotid and radial arteries.;Review the importance of being able to check your own pulse for safety during independent exercise       Expected Outcomes  Short Term: Able to explain why pulse  checking is important during independent exercise;Long Term: Able to check pulse independently and accurately       Understanding of Exercise Prescription  Yes       Intervention  Provide education, explanation, and written materials on patient's individual exercise prescription       Expected Outcomes  Short Term: Able to explain program exercise prescription;Long Term: Able to explain home exercise prescription to exercise independently          Exercise Goals Re-Evaluation :    Discharge Exercise Prescription (Final Exercise Prescription Changes):   Nutrition:  Target Goals: Understanding of nutrition guidelines, daily intake of sodium 1500mg , cholesterol 200mg , calories 30% from fat and 7% or less from saturated fats, daily to have 5 or more servings of fruits and  vegetables.  Biometrics: Pre Biometrics - 07/08/17 1047      Pre Biometrics   Height  5' 9.25" (1.759 m)    Weight  171 lb 4.8 oz (77.7 kg)    Waist Circumference  39.25 inches    Hip Circumference  38.5 inches    Waist to Hip Ratio  1.02 %    BMI (Calculated)  25.11    Triceps Skinfold  17 mm    % Body Fat  26.7 %    Grip Strength  45 kg    Flexibility  12.5 in    Single Leg Stand  30 seconds        Nutrition Therapy Plan and Nutrition Goals:   Nutrition Assessments:   Nutrition Goals Re-Evaluation:   Nutrition Goals Re-Evaluation:   Nutrition Goals Discharge (Final Nutrition Goals Re-Evaluation):   Psychosocial: Target Goals: Acknowledge presence or absence of significant depression and/or stress, maximize coping skills, provide positive support system. Participant is able to verbalize types and ability to use techniques and skills needed for reducing stress and depression.  Initial Review & Psychosocial Screening: Initial Psych Review & Screening - 07/08/17 1225      Initial Review   Current issues with  History of Depression      Family Dynamics   Comments  Patient to bring in psychosocial assessment and QOL questionnaire next week      Barriers   Psychosocial barriers to participate in program  The patient should benefit from training in stress management and relaxation.      Screening Interventions   Interventions  Encouraged to exercise    Expected Outcomes  Short Term goal: Utilizing psychosocial counselor, staff and physician to assist with identification of specific Stressors or current issues interfering with healing process. Setting desired goal for each stressor or current issue identified.;Long Term Goal: Stressors or current issues are controlled or eliminated.;Short Term goal: Identification and review with participant of any Quality of Life or Depression concerns found by scoring the questionnaire.;Long Term goal: The participant improves quality  of Life and PHQ9 Scores as seen by post scores and/or verbalization of changes       Quality of Life Scores:  Scores of 19 and below usually indicate a poorer quality of life in these areas.  A difference of  2-3 points is a clinically meaningful difference.  A difference of 2-3 points in the total score of the Quality of Life Index has been associated with significant improvement in overall quality of life, self-image, physical symptoms, and general health in studies assessing change in quality of life.  PHQ-9: Recent Review Flowsheet Data    There is no flowsheet data to display.     Interpretation  of Total Score  Total Score Depression Severity:  1-4 = Minimal depression, 5-9 = Mild depression, 10-14 = Moderate depression, 15-19 = Moderately severe depression, 20-27 = Severe depression   Psychosocial Evaluation and Intervention:   Psychosocial Re-Evaluation:   Psychosocial Discharge (Final Psychosocial Re-Evaluation):   Vocational Rehabilitation: Provide vocational rehab assistance to qualifying candidates.   Vocational Rehab Evaluation & Intervention: Vocational Rehab - 07/08/17 1225      Initial Vocational Rehab Evaluation & Intervention   Assessment shows need for Vocational Rehabilitation  No       Education: Education Goals: Education classes will be provided on a weekly basis, covering required topics. Participant will state understanding/return demonstration of topics presented.  Learning Barriers/Preferences: Learning Barriers/Preferences - 07/08/17 1014      Learning Barriers/Preferences   Learning Barriers  Sight;Hearing    Learning Preferences  Video;Skilled Demonstration;Pictoral       Education Topics: Count Your Pulse:  -Group instruction provided by verbal instruction, demonstration, patient participation and written materials to support subject.  Instructors address importance of being able to find your pulse and how to count your pulse when at  home without a heart monitor.  Patients get hands on experience counting their pulse with staff help and individually.   Heart Attack, Angina, and Risk Factor Modification:  -Group instruction provided by verbal instruction, video, and written materials to support subject.  Instructors address signs and symptoms of angina and heart attacks.    Also discuss risk factors for heart disease and how to make changes to improve heart health risk factors.   Functional Fitness:  -Group instruction provided by verbal instruction, demonstration, patient participation, and written materials to support subject.  Instructors address safety measures for doing things around the house.  Discuss how to get up and down off the floor, how to pick things up properly, how to safely get out of a chair without assistance, and balance training.   Meditation and Mindfulness:  -Group instruction provided by verbal instruction, patient participation, and written materials to support subject.  Instructor addresses importance of mindfulness and meditation practice to help reduce stress and improve awareness.  Instructor also leads participants through a meditation exercise.    Stretching for Flexibility and Mobility:  -Group instruction provided by verbal instruction, patient participation, and written materials to support subject.  Instructors lead participants through series of stretches that are designed to increase flexibility thus improving mobility.  These stretches are additional exercise for major muscle groups that are typically performed during regular warm up and cool down.   Hands Only CPR:  -Group verbal, video, and participation provides a basic overview of AHA guidelines for community CPR. Role-play of emergencies allow participants the opportunity to practice calling for help and chest compression technique with discussion of AED use.   Hypertension: -Group verbal and written instruction that provides a  basic overview of hypertension including the most recent diagnostic guidelines, risk factor reduction with self-care instructions and medication management.    Nutrition I class: Heart Healthy Eating:  -Group instruction provided by PowerPoint slides, verbal discussion, and written materials to support subject matter. The instructor gives an explanation and review of the Therapeutic Lifestyle Changes diet recommendations, which includes a discussion on lipid goals, dietary fat, sodium, fiber, plant stanol/sterol esters, sugar, and the components of a well-balanced, healthy diet.   Nutrition II class: Lifestyle Skills:  -Group instruction provided by PowerPoint slides, verbal discussion, and written materials to support subject matter. The instructor gives an explanation  and review of label reading, grocery shopping for heart health, heart healthy recipe modifications, and ways to make healthier choices when eating out.   Diabetes Question & Answer:  -Group instruction provided by PowerPoint slides, verbal discussion, and written materials to support subject matter. The instructor gives an explanation and review of diabetes co-morbidities, pre- and post-prandial blood glucose goals, pre-exercise blood glucose goals, signs, symptoms, and treatment of hypoglycemia and hyperglycemia, and foot care basics.   Diabetes Blitz:  -Group instruction provided by PowerPoint slides, verbal discussion, and written materials to support subject matter. The instructor gives an explanation and review of the physiology behind type 1 and type 2 diabetes, diabetes medications and rational behind using different medications, pre- and post-prandial blood glucose recommendations and Hemoglobin A1c goals, diabetes diet, and exercise including blood glucose guidelines for exercising safely.    Portion Distortion:  -Group instruction provided by PowerPoint slides, verbal discussion, written materials, and food models to  support subject matter. The instructor gives an explanation of serving size versus portion size, changes in portions sizes over the last 20 years, and what consists of a serving from each food group.   Stress Management:  -Group instruction provided by verbal instruction, video, and written materials to support subject matter.  Instructors review role of stress in heart disease and how to cope with stress positively.     Exercising on Your Own:  -Group instruction provided by verbal instruction, power point, and written materials to support subject.  Instructors discuss benefits of exercise, components of exercise, frequency and intensity of exercise, and end points for exercise.  Also discuss use of nitroglycerin and activating EMS.  Review options of places to exercise outside of rehab.  Review guidelines for sex with heart disease.   Cardiac Drugs I:  -Group instruction provided by verbal instruction and written materials to support subject.  Instructor reviews cardiac drug classes: antiplatelets, anticoagulants, beta blockers, and statins.  Instructor discusses reasons, side effects, and lifestyle considerations for each drug class.   Cardiac Drugs II:  -Group instruction provided by verbal instruction and written materials to support subject.  Instructor reviews cardiac drug classes: angiotensin converting enzyme inhibitors (ACE-I), angiotensin II receptor blockers (ARBs), nitrates, and calcium channel blockers.  Instructor discusses reasons, side effects, and lifestyle considerations for each drug class.   Anatomy and Physiology of the Circulatory System:  Group verbal and written instruction and models provide basic cardiac anatomy and physiology, with the coronary electrical and arterial systems. Review of: AMI, Angina, Valve disease, Heart Failure, Peripheral Artery Disease, Cardiac Arrhythmia, Pacemakers, and the ICD.   Other Education:  -Group or individual verbal, written, or  video instructions that support the educational goals of the cardiac rehab program.   Holiday Eating Survival Tips:  -Group instruction provided by PowerPoint slides, verbal discussion, and written materials to support subject matter. The instructor gives patients tips, tricks, and techniques to help them not only survive but enjoy the holidays despite the onslaught of food that accompanies the holidays.   Knowledge Questionnaire Score:   Core Components/Risk Factors/Patient Goals at Admission: Personal Goals and Risk Factors at Admission - 07/08/17 0942      Core Components/Risk Factors/Patient Goals on Admission    Weight Management  Yes;Weight Maintenance    Intervention  Weight Management: Develop a combined nutrition and exercise program designed to reach desired caloric intake, while maintaining appropriate intake of nutrient and fiber, sodium and fats, and appropriate energy expenditure required for the weight goal.;Weight Management: Provide  education and appropriate resources to help participant work on and attain dietary goals.;Weight Management/Obesity: Establish reasonable short term and long term weight goals.    Admit Weight  171 lb 4.8 oz (77.7 kg)    Expected Outcomes  Short Term: Continue to assess and modify interventions until short term weight is achieved;Weight Maintenance: Understanding of the daily nutrition guidelines, which includes 25-35% calories from fat, 7% or less cal from saturated fats, less than 200mg  cholesterol, less than 1.5gm of sodium, & 5 or more servings of fruits and vegetables daily;Long Term: Adherence to nutrition and physical activity/exercise program aimed toward attainment of established weight goal;Understanding recommendations for meals to include 15-35% energy as protein, 25-35% energy from fat, 35-60% energy from carbohydrates, less than 200mg  of dietary cholesterol, 20-35 gm of total fiber daily;Understanding of distribution of calorie intake  throughout the day with the consumption of 4-5 meals/snacks    Diabetes  Yes    Intervention  Provide education about signs/symptoms and action to take for hypo/hyperglycemia.;Provide education about proper nutrition, including hydration, and aerobic/resistive exercise prescription along with prescribed medications to achieve blood glucose in normal ranges: Fasting glucose 65-99 mg/dL    Expected Outcomes  Short Term: Participant verbalizes understanding of the signs/symptoms and immediate care of hyper/hypoglycemia, proper foot care and importance of medication, aerobic/resistive exercise and nutrition plan for blood glucose control.;Long Term: Attainment of HbA1C < 7%.    Hypertension  Yes    Intervention  Provide education on lifestyle modifcations including regular physical activity/exercise, weight management, moderate sodium restriction and increased consumption of fresh fruit, vegetables, and low fat dairy, alcohol moderation, and smoking cessation.;Monitor prescription use compliance.    Expected Outcomes  Short Term: Continued assessment and intervention until BP is < 140/27mm HG in hypertensive participants. < 130/56mm HG in hypertensive participants with diabetes, heart failure or chronic kidney disease.;Long Term: Maintenance of blood pressure at goal levels.    Intervention  Provide education and support for participant on nutrition & aerobic/resistive exercise along with prescribed medications to achieve LDL 70mg , HDL >40mg .    Expected Outcomes  Short Term: Participant states understanding of desired cholesterol values and is compliant with medications prescribed. Participant is following exercise prescription and nutrition guidelines.;Long Term: Cholesterol controlled with medications as prescribed, with individualized exercise RX and with personalized nutrition plan. Value goals: LDL < 70mg , HDL > 40 mg.    Stress  Yes    Intervention  Offer individual and/or small group education and  counseling on adjustment to heart disease, stress management and health-related lifestyle change. Teach and support self-help strategies.;Refer participants experiencing significant psychosocial distress to appropriate mental health specialists for further evaluation and treatment. When possible, include family members and significant others in education/counseling sessions.    Expected Outcomes  Short Term: Participant demonstrates changes in health-related behavior, relaxation and other stress management skills, ability to obtain effective social support, and compliance with psychotropic medications if prescribed.;Long Term: Emotional wellbeing is indicated by absence of clinically significant psychosocial distress or social isolation.       Core Components/Risk Factors/Patient Goals Review:    Core Components/Risk Factors/Patient Goals at Discharge (Final Review):    ITP Comments: ITP Comments    Row Name 07/08/17 1008           ITP Comments  Dr. Fransico Him, Medical Director          Comments: Naida Sleight attended orientation from [place] to [facility] to review rules and guidelines for program. Completed 6 minute walk test,  Intitial ITP, and exercise prescription.  VSS. Telemetry-Sinus Brady, Sinus Rhythm Asymptomatic. Mendel Ryder Robert's NP  notified about bradycardia. See Previous progress note.[name], RN,BSN 07/08/2017 12:37 PM

## 2017-07-08 NOTE — Progress Notes (Signed)
Patient here for orientation at cardiac rehab. Resting heart rate noted as low as 46. Patient asymptomatic. Blood pressure 114/60. Oxygen saturation 96% on room air. Mr Brian Hudson completed the walk test without difficulty or complaints. Harlan Stains NP called and notified. No new orders received. Will continue to monitor the patient. Mendel Ryder says Mr Brian Hudson is okay to proceed exercise at cardiac rehab.Will fax exercise flow sheets to Dr. Marlou Porch  office for review with today's ECG tracings.[name], RN,BSN 07/08/2017 12:12 PM

## 2017-07-14 ENCOUNTER — Encounter: Payer: Self-pay | Admitting: Cardiology

## 2017-07-14 ENCOUNTER — Encounter (HOSPITAL_COMMUNITY)
Admission: RE | Admit: 2017-07-14 | Discharge: 2017-07-14 | Disposition: A | Discharge status: Home / Self Care | Payer: Medicare Other | Source: Ambulatory Visit | Attending: Cardiology | Admitting: Cardiology

## 2017-07-14 VITALS — BP 118/62 | HR 51 | Wt 171.3 lb

## 2017-07-14 DIAGNOSIS — Z955 Presence of coronary angioplasty implant and graft: Secondary | ICD-10-CM

## 2017-07-14 LAB — GLUCOSE, CAPILLARY
Glucose-Capillary: 172 mg/dL — ABNORMAL HIGH (ref 65–99)
Glucose-Capillary: 136 mg/dL — ABNORMAL HIGH (ref 65–99)

## 2017-07-14 NOTE — Progress Notes (Signed)
Daily Session Note  Patient Details  Name: Brian Hudson MRN: [id number] Date of Birth: [birth year] Referring Provider:     CARDIAC REHAB PHASE II ORIENTATION from 07/08/2017 in [facility]  Referring Provider  Brian Furbish MD      Encounter Date: 07/14/2017  Check In: Session Check In - 07/14/17 1040      Check-In   Location  [facility]    Staff Present  Brian Nutting Fair, MS, ACSM RCEP, Exercise Physiologist;Brian Nevels, MS,ACSM CEP, Exercise Physiologist;Brian Keady, RN, Brian Pretty, MS, ACSM CEP, Exercise Physiologist    Supervising physician immediately available to respond to emergencies  [facility] immediately available    Physician(s)  Dr. Lonny Hudson    Medication changes reported      No    Fall or balance concerns reported     No    Tobacco Cessation  No Change    Warm-up and Cool-down  Performed as group-led instruction    Resistance Training Performed  Yes    VAD Patient?  No      Pain Assessment   Currently in Pain?  No/denies    Multiple Pain Sites  No       Capillary Blood Glucose: Results for orders placed or performed during the hospital encounter of 07/14/17 (from the past 24 hour(s))  Glucose, capillary     Status: Abnormal   Collection Time: 07/14/17  9:58 AM  Result Value Ref Range   Glucose-Capillary 172 (H) 65 - 99 mg/dL  Glucose, capillary     Status: Abnormal   Collection Time: 07/14/17 10:46 AM  Result Value Ref Range   Glucose-Capillary 136 (H) 65 - 99 mg/dL      Social History   Tobacco Use  Smoking Status Former Smoker  . Packs/day: 3.00  . Years: 35.00  . Pack years: 105.00  . Types: Cigarettes  . Last attempt to quit: 04/09/1991  . Years since quitting: 26.2  Smokeless Tobacco Never Used    Goals Met:  Exercise tolerated well  Goals Unmet:  Not Applicable  Comments: Pt started cardiac rehab today.  Pt tolerated light exercise without difficulty. VSS, telemetry-Sinus  brady, Sinus Rhtyhm, asymptomatic. Resting heart rate in the 50's today. Updated exercise flow sheets sent to Dr Brian Hudson office for review.  Medication list reconciled. Pt denies barriers to medicaiton compliance.  PSYCHOSOCIAL ASSESSMENT:  PHQ-1. Brian Hudson admits that he is depressed at time as he has had some family stressors. Brian Hudson is taking an antidepressant that is currently working for Hudson. Brian Hudson says he has an older sister who is supportive. I offered Brian Hudson to meet with the hospital chaplain. Brian Hudson declined at this time but is looking forward to participating in phase 2 cardiac rehab.   Pt enjoys camping..   Pt oriented to exercise equipment and routine.    Understanding verbalized.Brian Pall, RN,BSN 07/14/2017 5:10 PM   Dr. Fransico Hudson is Medical Director for Cardiac Rehab at [facility].

## 2017-07-16 ENCOUNTER — Encounter (HOSPITAL_COMMUNITY): Payer: Medicare Other

## 2017-07-18 ENCOUNTER — Encounter (HOSPITAL_COMMUNITY): Payer: Medicare Other

## 2017-07-21 ENCOUNTER — Encounter (HOSPITAL_COMMUNITY): Payer: Medicare Other

## 2017-07-23 ENCOUNTER — Encounter (HOSPITAL_COMMUNITY): Payer: Medicare Other

## 2017-07-23 ENCOUNTER — Telehealth (HOSPITAL_COMMUNITY): Payer: Self-pay | Admitting: Internal Medicine

## 2017-07-24 ENCOUNTER — Encounter (HOSPITAL_COMMUNITY): Payer: Self-pay | Admitting: *Deleted

## 2017-07-24 DIAGNOSIS — Z955 Presence of coronary angioplasty implant and graft: Secondary | ICD-10-CM

## 2017-07-24 NOTE — Progress Notes (Signed)
Cardiac Individual Treatment Plan  Patient Details  Name: Brian Hudson MRN: [id number] Date of Birth: March 12, 1951 Referring Provider:     CARDIAC REHAB PHASE II ORIENTATION from 07/08/2017 in [place]  Referring Provider  Candee Furbish MD      Initial Encounter Date:    CARDIAC REHAB PHASE II ORIENTATION from 07/08/2017 in [place]  Date  07/08/17  Referring Provider  Candee Furbish MD      Visit Diagnosis: Status post coronary artery stent placement 05/23/17 S/P DES OM  Patient's Home Medications on Admission:  Current Outpatient Medications:  .  acetaminophen (TYLENOL) 325 MG tablet, Take 650 mg by mouth daily. , Disp: , Rfl:  .  amLODipine (NORVASC) 5 MG tablet, Take 1 tablet (5 mg total) by mouth daily., Disp: 90 tablet, Rfl: 3 .  aspirin EC 81 MG tablet, Take 1 tablet (81 mg total) by mouth daily. (Patient taking differently: Take 81 mg by mouth at bedtime. ), Disp: 30 tablet, Rfl: 0 .  budesonide-formoterol (SYMBICORT) 160-4.5 MCG/ACT inhaler, Inhale 2 puffs into the lungs 2 (two) times daily., Disp: 1 Inhaler, Rfl: 5 .  clopidogrel (PLAVIX) 75 MG tablet, Take 1 tablet (75 mg total) by mouth daily., Disp: 90 tablet, Rfl: 3 .  escitalopram (LEXAPRO) 10 MG tablet, Take 10 mg by mouth daily., Disp: , Rfl:  .  hydrocortisone cream 1 %, Apply 1 application topically daily as needed for itching., Disp: , Rfl:  .  metFORMIN (GLUCOPHAGE-XR) 500 MG 24 hr tablet, Take 500 mg by mouth 2 (two) times daily after a meal. , Disp: , Rfl:  .  Multiple Vitamin (MULTIVITAMIN WITH MINERALS) TABS tablet, Take 1 tablet by mouth daily., Disp: , Rfl:  .  nitroGLYCERIN (NITROSTAT) 0.4 MG SL tablet, Place 1 tablet (0.4 mg total) under the tongue every 5 (five) minutes as needed for chest pain., Disp: 28 tablet, Rfl: 3 .  pantoprazole (PROTONIX) 40 MG tablet, Take 1 tablet (40 mg total) by mouth 2 (two) times daily., Disp: 60 tablet, Rfl: 2 .   rosuvastatin (CRESTOR) 10 MG tablet, Take 1 tablet (10 mg total) by mouth daily., Disp: 90 tablet, Rfl: 3 .  SITagliptin Phosphate (JANUVIA PO), Take by mouth daily., Disp: , Rfl:  .  sodium chloride (OCEAN) 0.65 % SOLN nasal spray, Place 1 spray into both nostrils as needed for congestion., Disp: , Rfl:   Past Medical History: Past Medical History:  Diagnosis Date  . Anxiety   . CAD (coronary artery disease)    a. 09/20/16: 45% OM, 99% ostial ramus s/p DES, 80% proximal LCx s/p DES, 80% more distal LCx on AV groove portion of the circumflex after OM1 s/p cutting balloon and PCTA.   Marland Kitchen Concussion   . DM (diabetes mellitus) ([place])    type 2  . ED (erectile dysfunction)   . Fracture 2010   L 3 and L 4 healed with brace  . GERD (gastroesophageal reflux disease)   . HTN (hypertension)   . Hyperlipidemia   . IBS (irritable bowel syndrome)   . Palpitations    none in last few yrs  . Prostate cancer ([place]) 2007  . Sigmoid diverticulosis   . Wegener's granulomatosis ([place])    followed by dr Berna Bue    Tobacco Use: Social History   Tobacco Use  Smoking Status Former Smoker  . Packs/day: 3.00  . Years: 35.00  . Pack years: 105.00  . Types:  Cigarettes  . Last attempt to quit: 04/09/1991  . Years since quitting: 26.3  Smokeless Tobacco Never Used    Labs: Recent Review Scientist, physiological    Labs for ITP Cardiac and Pulmonary Rehab Latest Ref Rng & Units 09/06/2016   Cholestrol 100 - 199 mg/dL 185   LDLCALC 0 - 99 mg/dL 134(H)   HDL >39 mg/dL 33(L)   Trlycerides 0 - 149 mg/dL 89      Capillary Blood Glucose: Lab Results  Component Value Date   GLUCAP 136 (H) 07/14/2017   GLUCAP 172 (H) 07/14/2017   GLUCAP 172 (H) 05/23/2017   GLUCAP 200 (H) 05/23/2017   GLUCAP 210 (H) 04/11/2017     Exercise Target Goals:    Exercise Program Goal: Individual exercise prescription set using results from initial 6 min walk test and THRR while considering  patient's activity barriers and  safety.   Exercise Prescription Goal: Initial exercise prescription builds to 30-45 minutes a day of aerobic activity, 2-3 days per week.  Home exercise guidelines will be given to patient during program as part of exercise prescription that the participant will acknowledge.  Activity Barriers & Risk Stratification: Activity Barriers & Cardiac Risk Stratification - 07/08/17 1045      Activity Barriers & Cardiac Risk Stratification   Activity Barriers  Deconditioning;Joint Problems;Back Problems;Neck/Spine Problems;Other (comment);Arthritis    Comments  R knee pain, LBP! and neck stiffness    Cardiac Risk Stratification  High       6 Minute Walk: 6 Minute Walk    Row Name 07/08/17 1017 07/08/17 1045 07/08/17 1102     6 Minute Walk   Phase  Initial  (Pended)   -  Initial   Distance  1600 feet  (Pended)   1600 feet  -   Walk Time  6 minutes  (Pended)   6 minutes  -   # of Rest Breaks  0  (Pended)   0  -   MPH  3.03  (Pended)   3  -   METS  -  3.3  -   RPE  -  9  -   VO2 Peak  -  11.5  -   Symptoms  -  No  -   Resting HR  -  49 bpm  -   Resting BP  -  114/60  -   Resting Oxygen Saturation   -  100 %  -   Exercise Oxygen Saturation  during 6 min walk  -  99 %  -   Max Ex. HR  -  67 bpm  -   Max Ex. BP  -  112/72  -   2 Minute Post BP  -  118/60  -      Oxygen Initial Assessment:   Oxygen Re-Evaluation:   Oxygen Discharge (Final Oxygen Re-Evaluation):   Initial Exercise Prescription: Initial Exercise Prescription - 07/08/17 1000      Date of Initial Exercise RX and Referring Provider   Date  07/08/17    Referring Provider  Candee Furbish MD      Treadmill   MPH  2.8    Grade  0    Minutes  10    METs  3.14      Bike   Level  0.7    Minutes  10    METs  2.65      NuStep   Level  3    SPM  80  Minutes  10    METs  2.5      Prescription Details   Frequency (times per week)  3    Duration  Progress to 30 minutes of continuous aerobic without  signs/symptoms of physical distress      Intensity   THRR 40-80% of Max Heartrate  61-122    Ratings of Perceived Exertion  11-13    Perceived Dyspnea  0-4      Progression   Progression  Continue to progress workloads to maintain intensity without signs/symptoms of physical distress.      Resistance Training   Training Prescription  Yes    Weight  3lbs    Reps  10-15       Perform Capillary Blood Glucose checks as needed.  Exercise Prescription Changes:  Exercise Prescription Changes    Row Name 07/14/17 0955             Response to Exercise   Blood Pressure (Admit)  118/62       Blood Pressure (Exercise)  140/70       Blood Pressure (Exit)  110/62       Heart Rate (Admit)  51 bpm       Heart Rate (Exercise)  94 bpm       Heart Rate (Exit)  52 bpm       Rating of Perceived Exertion (Exercise)  12       Symptoms  none       Duration  Continue with 30 min of aerobic exercise without signs/symptoms of physical distress.       Intensity  THRR unchanged         Progression   Progression  Continue to progress workloads to maintain intensity without signs/symptoms of physical distress.       Average METs  3.3         Resistance Training   Training Prescription  Yes       Weight  3lbs       Reps  10-15       Time  10 Minutes         Interval Training   Interval Training  No         Treadmill   MPH  2.6       Grade  0       Minutes  10       METs  2.99         Bike   Level  1       Minutes  10       METs  3.43         NuStep   Level  3       SPM  80       Minutes  10       METs  3.5          Exercise Comments:  Exercise Comments    Row Name 07/14/17 1349           Exercise Comments  Patient off to a good start with exercise,  tolerated mild to  moderate intensity without c/o.          Exercise Goals and Review:  Exercise Goals    Row Name 07/08/17 1046             Exercise Goals   Increase Physical Activity  Yes       Intervention   Provide advice, education, support and counseling about  physical activity/exercise needs.;Develop an individualized exercise prescription for aerobic and resistive training based on initial evaluation findings, risk stratification, comorbidities and participant's personal goals.       Expected Outcomes  Short Term: Attend rehab on a regular basis to increase amount of physical activity.;Long Term: Add in home exercise to make exercise part of routine and to increase amount of physical activity.;Long Term: Exercising regularly at least 3-5 days a week.       Increase Strength and Stamina  Yes improve mobility, and increase motivation to exercise       Intervention  Provide advice, education, support and counseling about physical activity/exercise needs.;Develop an individualized exercise prescription for aerobic and resistive training based on initial evaluation findings, risk stratification, comorbidities and participant's personal goals.       Expected Outcomes  Short Term: Increase workloads from initial exercise prescription for resistance, speed, and METs.;Short Term: Perform resistance training exercises routinely during rehab and add in resistance training at home;Long Term: Improve cardiorespiratory fitness, muscular endurance and strength as measured by increased METs and functional capacity (6MWT)       Able to understand and use rate of perceived exertion (RPE) scale  Yes       Intervention  Provide education and explanation on how to use RPE scale       Expected Outcomes  Short Term: Able to use RPE daily in rehab to express subjective intensity level;Long Term:  Able to use RPE to guide intensity level when exercising independently       Knowledge and understanding of Target Heart Rate Range (THRR)  Yes       Intervention  Provide education and explanation of THRR including how the numbers were predicted and where they are located for reference       Expected Outcomes  Long Term: Able to use  THRR to govern intensity when exercising independently;Short Term: Able to state/look up THRR;Short Term: Able to use daily as guideline for intensity in rehab       Able to check pulse independently  Yes       Intervention  Provide education and demonstration on how to check pulse in carotid and radial arteries.;Review the importance of being able to check your own pulse for safety during independent exercise       Expected Outcomes  Short Term: Able to explain why pulse checking is important during independent exercise;Long Term: Able to check pulse independently and accurately       Understanding of Exercise Prescription  Yes       Intervention  Provide education, explanation, and written materials on patient's individual exercise prescription       Expected Outcomes  Short Term: Able to explain program exercise prescription;Long Term: Able to explain home exercise prescription to exercise independently          Exercise Goals Re-Evaluation : Exercise Goals Re-Evaluation    Row Name 07/14/17 1348             Exercise Goal Re-Evaluation   Exercise Goals Review  Able to understand and use rate of perceived exertion (RPE) scale       Comments  Patient able to understand and use RPE scale appropriately.       Expected Outcomes  Increase workloads as tolerated.           Discharge Exercise Prescription (Final Exercise Prescription Changes): Exercise Prescription Changes - 07/14/17 0955      Response to Exercise   Blood Pressure (  Admit)  118/62    Blood Pressure (Exercise)  140/70    Blood Pressure (Exit)  110/62    Heart Rate (Admit)  51 bpm    Heart Rate (Exercise)  94 bpm    Heart Rate (Exit)  52 bpm    Rating of Perceived Exertion (Exercise)  12    Symptoms  none    Duration  Continue with 30 min of aerobic exercise without signs/symptoms of physical distress.    Intensity  THRR unchanged      Progression   Progression  Continue to progress workloads to maintain intensity  without signs/symptoms of physical distress.    Average METs  3.3      Resistance Training   Training Prescription  Yes    Weight  3lbs    Reps  10-15    Time  10 Minutes      Interval Training   Interval Training  No      Treadmill   MPH  2.6    Grade  0    Minutes  10    METs  2.99      Bike   Level  1    Minutes  10    METs  3.43      NuStep   Level  3    SPM  80    Minutes  10    METs  3.5       Nutrition:  Target Goals: Understanding of nutrition guidelines, daily intake of sodium 1500mg , cholesterol 200mg , calories 30% from fat and 7% or less from saturated fats, daily to have 5 or more servings of fruits and vegetables.  Biometrics: Pre Biometrics - 07/08/17 1047      Pre Biometrics   Height  5' 9.25" (1.759 m)    Weight  171 lb 4.8 oz (77.7 kg)    Waist Circumference  39.25 inches    Hip Circumference  38.5 inches    Waist to Hip Ratio  1.02 %    BMI (Calculated)  25.11    Triceps Skinfold  17 mm    % Body Fat  26.7 %    Grip Strength  45 kg    Flexibility  12.5 in    Single Leg Stand  30 seconds        Nutrition Therapy Plan and Nutrition Goals: Nutrition Therapy & Goals - 07/08/17 1347      Nutrition Therapy   Diet  Carb Modified, Therapeutic Lifestyle Choices      Personal Nutrition Goals   Nutrition Goal  Pt to identify and limit food sources of saturated fat, trans fat, and sodium    Personal Goal #2  Pt to describe the benefit of including fruits, vegetables, whole grains, and low-fat dairy products in a heart healthy meal plan.    Personal Goal #3  Improved blood glucose control as evidenced by pt's A1c trending from 8.5 (per MD note) toward less than 7.0.      Intervention Plan   Intervention  Prescribe, educate and counsel regarding individualized specific dietary modifications aiming towards targeted core components such as weight, hypertension, lipid management, diabetes, heart failure and other comorbidities.    Expected Outcomes   Short Term Goal: Understand basic principles of dietary content, such as calories, fat, sodium, cholesterol and nutrients.;Long Term Goal: Adherence to prescribed nutrition plan.       Nutrition Assessments: Nutrition Assessments - 07/21/17 0828      MEDFICTS Scores   Pre  Score  132    Post Score  73    Score Difference  -59       Nutrition Goals Re-Evaluation:   Nutrition Goals Re-Evaluation:   Nutrition Goals Discharge (Final Nutrition Goals Re-Evaluation):   Psychosocial: Target Goals: Acknowledge presence or absence of significant depression and/or stress, maximize coping skills, provide positive support system. Participant is able to verbalize types and ability to use techniques and skills needed for reducing stress and depression.  Initial Review & Psychosocial Screening: Initial Psych Review & Screening - 07/08/17 1225      Initial Review   Current issues with  History of Depression      Family Dynamics   Comments  Patient to bring in psychosocial assessment and QOL questionnaire next week      Barriers   Psychosocial barriers to participate in program  The patient should benefit from training in stress management and relaxation.      Screening Interventions   Interventions  Encouraged to exercise    Expected Outcomes  Short Term goal: Utilizing psychosocial counselor, staff and physician to assist with identification of specific Stressors or current issues interfering with healing process. Setting desired goal for each stressor or current issue identified.;Long Term Goal: Stressors or current issues are controlled or eliminated.;Short Term goal: Identification and review with participant of any Quality of Life or Depression concerns found by scoring the questionnaire.;Long Term goal: The participant improves quality of Life and PHQ9 Scores as seen by post scores and/or verbalization of changes       Quality of Life Scores: Quality of Life - 07/15/17 1642       Quality of Life Scores   Health/Function Pre  13.17 %    Socioeconomic Pre  15.63 %    Psych/Spiritual Pre  16.07 %    Family Pre  19.5 %    GLOBAL Pre  15.21 %      Scores of 19 and below usually indicate a poorer quality of life in these areas.  A difference of  2-3 points is a clinically meaningful difference.  A difference of 2-3 points in the total score of the Quality of Life Index has been associated with significant improvement in overall quality of life, self-image, physical symptoms, and general health in studies assessing change in quality of life.  PHQ-9: Recent Review Flowsheet Data    Depression screen [facility] 2/9 07/14/2017   Decreased Interest 0   Down, Depressed, Hopeless 1    PHQ - 2 Score 1     Interpretation of Total Score  Total Score Depression Severity:  1-4 = Minimal depression, 5-9 = Mild depression, 10-14 = Moderate depression, 15-19 = Moderately severe depression, 20-27 = Severe depression   Psychosocial Evaluation and Intervention:   Psychosocial Re-Evaluation:   Psychosocial Discharge (Final Psychosocial Re-Evaluation):   Vocational Rehabilitation: Provide vocational rehab assistance to qualifying candidates.   Vocational Rehab Evaluation & Intervention: Vocational Rehab - 07/08/17 1225      Initial Vocational Rehab Evaluation & Intervention   Assessment shows need for Vocational Rehabilitation  No       Education: Education Goals: Education classes will be provided on a weekly basis, covering required topics. Participant will state understanding/return demonstration of topics presented.  Learning Barriers/Preferences: Learning Barriers/Preferences - 07/08/17 1014      Learning Barriers/Preferences   Learning Barriers  Sight;Hearing    Learning Preferences  Video;Skilled Demonstration;Pictoral       Education Topics: Count Your Pulse:  -Group instruction  provided by verbal instruction, demonstration, patient participation and written  materials to support subject.  Instructors address importance of being able to find your pulse and how to count your pulse when at home without a heart monitor.  Patients get hands on experience counting their pulse with staff help and individually.   Heart Attack, Angina, and Risk Factor Modification:  -Group instruction provided by verbal instruction, video, and written materials to support subject.  Instructors address signs and symptoms of angina and heart attacks.    Also discuss risk factors for heart disease and how to make changes to improve heart health risk factors.   Functional Fitness:  -Group instruction provided by verbal instruction, demonstration, patient participation, and written materials to support subject.  Instructors address safety measures for doing things around the house.  Discuss how to get up and down off the floor, how to pick things up properly, how to safely get out of a chair without assistance, and balance training.   Meditation and Mindfulness:  -Group instruction provided by verbal instruction, patient participation, and written materials to support subject.  Instructor addresses importance of mindfulness and meditation practice to help reduce stress and improve awareness.  Instructor also leads participants through a meditation exercise.    Stretching for Flexibility and Mobility:  -Group instruction provided by verbal instruction, patient participation, and written materials to support subject.  Instructors lead participants through series of stretches that are designed to increase flexibility thus improving mobility.  These stretches are additional exercise for major muscle groups that are typically performed during regular warm up and cool down.   Hands Only CPR:  -Group verbal, video, and participation provides a basic overview of AHA guidelines for community CPR. Role-play of emergencies allow participants the opportunity to practice calling for help and  chest compression technique with discussion of AED use.   Hypertension: -Group verbal and written instruction that provides a basic overview of hypertension including the most recent diagnostic guidelines, risk factor reduction with self-care instructions and medication management.    Nutrition I class: Heart Healthy Eating:  -Group instruction provided by PowerPoint slides, verbal discussion, and written materials to support subject matter. The instructor gives an explanation and review of the Therapeutic Lifestyle Changes diet recommendations, which includes a discussion on lipid goals, dietary fat, sodium, fiber, plant stanol/sterol esters, sugar, and the components of a well-balanced, healthy diet.   Nutrition II class: Lifestyle Skills:  -Group instruction provided by PowerPoint slides, verbal discussion, and written materials to support subject matter. The instructor gives an explanation and review of label reading, grocery shopping for heart health, heart healthy recipe modifications, and ways to make healthier choices when eating out.   Diabetes Question & Answer:  -Group instruction provided by PowerPoint slides, verbal discussion, and written materials to support subject matter. The instructor gives an explanation and review of diabetes co-morbidities, pre- and post-prandial blood glucose goals, pre-exercise blood glucose goals, signs, symptoms, and treatment of hypoglycemia and hyperglycemia, and foot care basics.   Diabetes Blitz:  -Group instruction provided by PowerPoint slides, verbal discussion, and written materials to support subject matter. The instructor gives an explanation and review of the physiology behind type 1 and type 2 diabetes, diabetes medications and rational behind using different medications, pre- and post-prandial blood glucose recommendations and Hemoglobin A1c goals, diabetes diet, and exercise including blood glucose guidelines for exercising safely.     Portion Distortion:  -Group instruction provided by PowerPoint slides, verbal discussion, written materials, and food models  to support subject matter. The instructor gives an explanation of serving size versus portion size, changes in portions sizes over the last 20 years, and what consists of a serving from each food group.   Stress Management:  -Group instruction provided by verbal instruction, video, and written materials to support subject matter.  Instructors review role of stress in heart disease and how to cope with stress positively.     Exercising on Your Own:  -Group instruction provided by verbal instruction, power point, and written materials to support subject.  Instructors discuss benefits of exercise, components of exercise, frequency and intensity of exercise, and end points for exercise.  Also discuss use of nitroglycerin and activating EMS.  Review options of places to exercise outside of rehab.  Review guidelines for sex with heart disease.   Cardiac Drugs I:  -Group instruction provided by verbal instruction and written materials to support subject.  Instructor reviews cardiac drug classes: antiplatelets, anticoagulants, beta blockers, and statins.  Instructor discusses reasons, side effects, and lifestyle considerations for each drug class.   Cardiac Drugs II:  -Group instruction provided by verbal instruction and written materials to support subject.  Instructor reviews cardiac drug classes: angiotensin converting enzyme inhibitors (ACE-I), angiotensin II receptor blockers (ARBs), nitrates, and calcium channel blockers.  Instructor discusses reasons, side effects, and lifestyle considerations for each drug class.   Anatomy and Physiology of the Circulatory System:  Group verbal and written instruction and models provide basic cardiac anatomy and physiology, with the coronary electrical and arterial systems. Review of: AMI, Angina, Valve disease, Heart Failure,  Peripheral Artery Disease, Cardiac Arrhythmia, Pacemakers, and the ICD.   Other Education:  -Group or individual verbal, written, or video instructions that support the educational goals of the cardiac rehab program.   Holiday Eating Survival Tips:  -Group instruction provided by PowerPoint slides, verbal discussion, and written materials to support subject matter. The instructor gives patients tips, tricks, and techniques to help them not only survive but enjoy the holidays despite the onslaught of food that accompanies the holidays.   Knowledge Questionnaire Score: Knowledge Questionnaire Score - 07/15/17 1642      Knowledge Questionnaire Score   Pre Score  24/28       Core Components/Risk Factors/Patient Goals at Admission: Personal Goals and Risk Factors at Admission - 07/08/17 0942      Core Components/Risk Factors/Patient Goals on Admission    Weight Management  Yes;Weight Maintenance    Intervention  Weight Management: Develop a combined nutrition and exercise program designed to reach desired caloric intake, while maintaining appropriate intake of nutrient and fiber, sodium and fats, and appropriate energy expenditure required for the weight goal.;Weight Management: Provide education and appropriate resources to help participant work on and attain dietary goals.;Weight Management/Obesity: Establish reasonable short term and long term weight goals.    Admit Weight  171 lb 4.8 oz (77.7 kg)    Expected Outcomes  Short Term: Continue to assess and modify interventions until short term weight is achieved;Weight Maintenance: Understanding of the daily nutrition guidelines, which includes 25-35% calories from fat, 7% or less cal from saturated fats, less than 200mg  cholesterol, less than 1.5gm of sodium, & 5 or more servings of fruits and vegetables daily;Long Term: Adherence to nutrition and physical activity/exercise program aimed toward attainment of established weight  goal;Understanding recommendations for meals to include 15-35% energy as protein, 25-35% energy from fat, 35-60% energy from carbohydrates, less than 200mg  of dietary cholesterol, 20-35 gm of total fiber daily;Understanding  of distribution of calorie intake throughout the day with the consumption of 4-5 meals/snacks    Diabetes  Yes    Intervention  Provide education about signs/symptoms and action to take for hypo/hyperglycemia.;Provide education about proper nutrition, including hydration, and aerobic/resistive exercise prescription along with prescribed medications to achieve blood glucose in normal ranges: Fasting glucose 65-99 mg/dL    Expected Outcomes  Short Term: Participant verbalizes understanding of the signs/symptoms and immediate care of hyper/hypoglycemia, proper foot care and importance of medication, aerobic/resistive exercise and nutrition plan for blood glucose control.;Long Term: Attainment of HbA1C < 7%.    Hypertension  Yes    Intervention  Provide education on lifestyle modifcations including regular physical activity/exercise, weight management, moderate sodium restriction and increased consumption of fresh fruit, vegetables, and low fat dairy, alcohol moderation, and smoking cessation.;Monitor prescription use compliance.    Expected Outcomes  Short Term: Continued assessment and intervention until BP is < 140/95mm HG in hypertensive participants. < 130/10mm HG in hypertensive participants with diabetes, heart failure or chronic kidney disease.;Long Term: Maintenance of blood pressure at goal levels.    Intervention  Provide education and support for participant on nutrition & aerobic/resistive exercise along with prescribed medications to achieve LDL 70mg , HDL >40mg .    Expected Outcomes  Short Term: Participant states understanding of desired cholesterol values and is compliant with medications prescribed. Participant is following exercise prescription and nutrition  guidelines.;Long Term: Cholesterol controlled with medications as prescribed, with individualized exercise RX and with personalized nutrition plan. Value goals: LDL < 70mg , HDL > 40 mg.    Stress  Yes    Intervention  Offer individual and/or small group education and counseling on adjustment to heart disease, stress management and health-related lifestyle change. Teach and support self-help strategies.;Refer participants experiencing significant psychosocial distress to appropriate mental health specialists for further evaluation and treatment. When possible, include family members and significant others in education/counseling sessions.    Expected Outcomes  Short Term: Participant demonstrates changes in health-related behavior, relaxation and other stress management skills, ability to obtain effective social support, and compliance with psychotropic medications if prescribed.;Long Term: Emotional wellbeing is indicated by absence of clinically significant psychosocial distress or social isolation.       Core Components/Risk Factors/Patient Goals Review:    Core Components/Risk Factors/Patient Goals at Discharge (Final Review):    ITP Comments: ITP Comments    Row Name 07/08/17 1008 07/24/17 1516         ITP Comments  Dr. Fransico Him, Medical Director  30 day ITP comments. Patient has attended 2 classes at cardiac rehab including orientation         Comments: See ITP comments.[name], RN,BSN 07/24/2017 3:19 PM

## 2017-07-25 ENCOUNTER — Encounter (HOSPITAL_COMMUNITY): Payer: Medicare Other

## 2017-07-27 ENCOUNTER — Other Ambulatory Visit: Payer: Self-pay | Admitting: Acute Care

## 2017-07-28 ENCOUNTER — Encounter (HOSPITAL_COMMUNITY): Admission: RE | Admit: 2017-07-28 | Payer: Medicare Other | Source: Ambulatory Visit

## 2017-07-30 ENCOUNTER — Encounter (HOSPITAL_COMMUNITY): Payer: Medicare Other

## 2017-08-01 ENCOUNTER — Encounter (HOSPITAL_COMMUNITY): Payer: Medicare Other

## 2017-08-01 ENCOUNTER — Telehealth (HOSPITAL_COMMUNITY): Payer: Self-pay | Admitting: *Deleted

## 2017-08-04 ENCOUNTER — Encounter (HOSPITAL_COMMUNITY): Payer: Medicare Other

## 2017-08-06 ENCOUNTER — Other Ambulatory Visit: Payer: Medicare Other | Admitting: *Deleted

## 2017-08-06 ENCOUNTER — Encounter (HOSPITAL_COMMUNITY): Payer: Medicare Other

## 2017-08-06 DIAGNOSIS — E785 Hyperlipidemia, unspecified: Secondary | ICD-10-CM | POA: Diagnosis not present

## 2017-08-06 LAB — LIPID PANEL
CHOL/HDL RATIO: 3.6 ratio (ref 0.0–5.0)
CHOLESTEROL TOTAL: 120 mg/dL (ref 100–199)
HDL: 33 mg/dL — ABNORMAL LOW (ref >39)
LDL CALC: 65 mg/dL (ref 0–99)
Triglycerides: 110 mg/dL (ref 0–149)
VLDL CHOLESTEROL CAL: 22 mg/dL (ref 5–40)

## 2017-08-06 LAB — ALT: ALT: 24 IU/L (ref 0–44)

## 2017-08-07 ENCOUNTER — Ambulatory Visit (HOSPITAL_COMMUNITY): Payer: Medicare Other

## 2017-08-07 DIAGNOSIS — M545 Low back pain: Secondary | ICD-10-CM | POA: Diagnosis not present

## 2017-08-07 DIAGNOSIS — R0602 Shortness of breath: Secondary | ICD-10-CM | POA: Diagnosis not present

## 2017-08-07 DIAGNOSIS — M150 Primary generalized (osteo)arthritis: Secondary | ICD-10-CM | POA: Diagnosis not present

## 2017-08-07 DIAGNOSIS — M3130 Wegener's granulomatosis without renal involvement: Secondary | ICD-10-CM | POA: Diagnosis not present

## 2017-08-08 ENCOUNTER — Encounter (HOSPITAL_COMMUNITY): Payer: Medicare Other

## 2017-08-11 ENCOUNTER — Encounter (HOSPITAL_COMMUNITY): Payer: Medicare Other

## 2017-08-13 ENCOUNTER — Encounter (HOSPITAL_COMMUNITY): Payer: Medicare Other

## 2017-08-15 ENCOUNTER — Encounter (HOSPITAL_COMMUNITY): Payer: Medicare Other

## 2017-08-18 ENCOUNTER — Encounter (HOSPITAL_COMMUNITY): Payer: Medicare Other

## 2017-08-20 ENCOUNTER — Encounter (HOSPITAL_COMMUNITY): Payer: Medicare Other

## 2017-08-22 ENCOUNTER — Encounter (HOSPITAL_COMMUNITY): Payer: Medicare Other

## 2017-08-25 ENCOUNTER — Encounter (HOSPITAL_COMMUNITY): Payer: Medicare Other

## 2017-08-27 ENCOUNTER — Encounter (HOSPITAL_COMMUNITY): Payer: Medicare Other

## 2017-08-28 DIAGNOSIS — E7800 Pure hypercholesterolemia, unspecified: Secondary | ICD-10-CM | POA: Diagnosis not present

## 2017-08-28 DIAGNOSIS — E1165 Type 2 diabetes mellitus with hyperglycemia: Secondary | ICD-10-CM | POA: Diagnosis not present

## 2017-08-28 DIAGNOSIS — I10 Essential (primary) hypertension: Secondary | ICD-10-CM | POA: Diagnosis not present

## 2017-08-28 DIAGNOSIS — I2510 Atherosclerotic heart disease of native coronary artery without angina pectoris: Secondary | ICD-10-CM | POA: Diagnosis not present

## 2017-08-29 ENCOUNTER — Encounter (HOSPITAL_COMMUNITY): Payer: Medicare Other

## 2017-09-03 ENCOUNTER — Encounter (HOSPITAL_COMMUNITY): Payer: Medicare Other

## 2017-09-03 NOTE — Addendum Note (Signed)
Encounter addended by: Sol Passer on: 09/03/2017 12:01 PM  Actions taken: Flowsheet data copied forward, Visit Navigator Flowsheet section accepted, Vitals modified

## 2017-09-05 ENCOUNTER — Encounter (HOSPITAL_COMMUNITY): Payer: Medicare Other

## 2017-09-08 ENCOUNTER — Encounter (HOSPITAL_COMMUNITY): Payer: Medicare Other

## 2017-09-10 ENCOUNTER — Encounter (HOSPITAL_COMMUNITY): Payer: Medicare Other

## 2017-09-12 ENCOUNTER — Encounter (HOSPITAL_COMMUNITY): Payer: Medicare Other

## 2017-09-12 DIAGNOSIS — L03311 Cellulitis of abdominal wall: Secondary | ICD-10-CM | POA: Diagnosis not present

## 2017-09-12 DIAGNOSIS — W57XXXA Bitten or stung by nonvenomous insect and other nonvenomous arthropods, initial encounter: Secondary | ICD-10-CM | POA: Diagnosis not present

## 2017-09-12 DIAGNOSIS — S30861A Insect bite (nonvenomous) of abdominal wall, initial encounter: Secondary | ICD-10-CM | POA: Diagnosis not present

## 2017-09-12 DIAGNOSIS — R05 Cough: Secondary | ICD-10-CM | POA: Diagnosis not present

## 2017-09-12 DIAGNOSIS — L0390 Cellulitis, unspecified: Secondary | ICD-10-CM | POA: Diagnosis not present

## 2017-09-12 DIAGNOSIS — M791 Myalgia, unspecified site: Secondary | ICD-10-CM | POA: Diagnosis not present

## 2017-09-15 ENCOUNTER — Encounter (HOSPITAL_COMMUNITY): Payer: Medicare Other

## 2017-09-16 DIAGNOSIS — N189 Chronic kidney disease, unspecified: Secondary | ICD-10-CM | POA: Diagnosis not present

## 2017-09-16 DIAGNOSIS — N183 Chronic kidney disease, stage 3 (moderate): Secondary | ICD-10-CM | POA: Diagnosis not present

## 2017-09-17 ENCOUNTER — Encounter (HOSPITAL_COMMUNITY): Payer: Medicare Other

## 2017-09-19 ENCOUNTER — Encounter (HOSPITAL_COMMUNITY): Payer: Medicare Other

## 2017-09-22 ENCOUNTER — Encounter (HOSPITAL_COMMUNITY): Payer: Medicare Other

## 2017-09-22 DIAGNOSIS — I2510 Atherosclerotic heart disease of native coronary artery without angina pectoris: Secondary | ICD-10-CM | POA: Diagnosis not present

## 2017-09-22 DIAGNOSIS — E1165 Type 2 diabetes mellitus with hyperglycemia: Secondary | ICD-10-CM | POA: Diagnosis not present

## 2017-09-22 DIAGNOSIS — D649 Anemia, unspecified: Secondary | ICD-10-CM | POA: Diagnosis not present

## 2017-09-22 DIAGNOSIS — I10 Essential (primary) hypertension: Secondary | ICD-10-CM | POA: Diagnosis not present

## 2017-09-24 ENCOUNTER — Encounter (HOSPITAL_COMMUNITY): Payer: Medicare Other

## 2017-09-24 DIAGNOSIS — D631 Anemia in chronic kidney disease: Secondary | ICD-10-CM | POA: Diagnosis not present

## 2017-09-24 DIAGNOSIS — N183 Chronic kidney disease, stage 3 (moderate): Secondary | ICD-10-CM | POA: Diagnosis not present

## 2017-09-24 DIAGNOSIS — I10 Essential (primary) hypertension: Secondary | ICD-10-CM | POA: Diagnosis not present

## 2017-09-26 ENCOUNTER — Encounter (HOSPITAL_COMMUNITY): Payer: Medicare Other

## 2017-09-26 ENCOUNTER — Other Ambulatory Visit (HOSPITAL_COMMUNITY): Payer: Self-pay | Admitting: Nephrology

## 2017-09-26 DIAGNOSIS — N183 Chronic kidney disease, stage 3 unspecified: Secondary | ICD-10-CM

## 2017-09-29 ENCOUNTER — Encounter (HOSPITAL_COMMUNITY): Payer: Medicare Other

## 2017-10-01 ENCOUNTER — Encounter (HOSPITAL_COMMUNITY): Payer: Medicare Other

## 2017-10-03 ENCOUNTER — Encounter (HOSPITAL_COMMUNITY): Payer: Medicare Other

## 2017-10-06 ENCOUNTER — Encounter (HOSPITAL_COMMUNITY): Payer: Medicare Other

## 2017-10-08 ENCOUNTER — Encounter (HOSPITAL_COMMUNITY): Payer: Medicare Other

## 2017-10-10 ENCOUNTER — Encounter (HOSPITAL_COMMUNITY): Payer: Medicare Other

## 2017-10-13 ENCOUNTER — Encounter (HOSPITAL_COMMUNITY): Payer: Medicare Other

## 2017-10-15 ENCOUNTER — Encounter (HOSPITAL_COMMUNITY): Payer: Medicare Other

## 2017-10-15 ENCOUNTER — Other Ambulatory Visit: Payer: Self-pay

## 2017-10-15 MED ORDER — CLOPIDOGREL BISULFATE 75 MG PO TABS: 75.0000 mg | ORAL_TABLET | Freq: Every day | ORAL | 1 refills | Status: DC

## 2017-10-17 ENCOUNTER — Encounter (HOSPITAL_COMMUNITY): Payer: Medicare Other

## 2017-10-17 DIAGNOSIS — J449 Chronic obstructive pulmonary disease, unspecified: Secondary | ICD-10-CM | POA: Diagnosis not present

## 2017-10-20 ENCOUNTER — Encounter (HOSPITAL_COMMUNITY): Payer: Medicare Other

## 2017-10-20 DIAGNOSIS — I10 Essential (primary) hypertension: Secondary | ICD-10-CM | POA: Diagnosis not present

## 2017-10-20 DIAGNOSIS — E7800 Pure hypercholesterolemia, unspecified: Secondary | ICD-10-CM | POA: Diagnosis not present

## 2017-10-20 DIAGNOSIS — E1165 Type 2 diabetes mellitus with hyperglycemia: Secondary | ICD-10-CM | POA: Diagnosis not present

## 2017-10-20 DIAGNOSIS — J449 Chronic obstructive pulmonary disease, unspecified: Secondary | ICD-10-CM | POA: Diagnosis not present

## 2017-10-21 ENCOUNTER — Encounter (HOSPITAL_COMMUNITY)
Admission: RE | Admit: 2017-10-21 | Discharge: 2017-10-21 | Disposition: A | Discharge status: Home / Self Care | Payer: Medicare Other | Source: Ambulatory Visit | Attending: Nephrology | Admitting: Nephrology

## 2017-10-21 ENCOUNTER — Ambulatory Visit (HOSPITAL_COMMUNITY)
Admission: RE | Admit: 2017-10-21 | Discharge: 2017-10-21 | Disposition: A | Discharge status: Home / Self Care | Payer: Medicare Other | Source: Ambulatory Visit | Attending: Nephrology | Admitting: Nephrology

## 2017-10-21 DIAGNOSIS — N183 Chronic kidney disease, stage 3 unspecified: Secondary | ICD-10-CM

## 2017-10-21 DIAGNOSIS — D351 Benign neoplasm of parathyroid gland: Secondary | ICD-10-CM | POA: Diagnosis not present

## 2017-10-21 DIAGNOSIS — R9389 Abnormal findings on diagnostic imaging of other specified body structures: Secondary | ICD-10-CM | POA: Diagnosis not present

## 2017-10-21 MED ORDER — TECHNETIUM TC 99M SESTAMIBI GENERIC - CARDIOLITE
25.0000 | Freq: Once | INTRAVENOUS | Status: AC | PRN
  Administered 2017-10-21: 25 via INTRAVENOUS

## 2017-10-22 ENCOUNTER — Encounter (HOSPITAL_COMMUNITY): Payer: Medicare Other

## 2017-10-24 ENCOUNTER — Encounter (HOSPITAL_COMMUNITY): Payer: Medicare Other

## 2017-10-27 ENCOUNTER — Encounter (HOSPITAL_COMMUNITY): Payer: Medicare Other

## 2017-10-29 ENCOUNTER — Encounter (HOSPITAL_COMMUNITY): Payer: Medicare Other

## 2017-10-31 ENCOUNTER — Encounter (HOSPITAL_COMMUNITY): Payer: Medicare Other

## 2017-11-03 ENCOUNTER — Encounter (HOSPITAL_COMMUNITY): Payer: Medicare Other

## 2017-11-05 ENCOUNTER — Encounter (HOSPITAL_COMMUNITY): Payer: Medicare Other

## 2017-11-07 ENCOUNTER — Encounter (HOSPITAL_COMMUNITY): Payer: Medicare Other

## 2017-11-10 ENCOUNTER — Encounter (HOSPITAL_COMMUNITY): Payer: Medicare Other

## 2017-11-12 ENCOUNTER — Encounter (HOSPITAL_COMMUNITY): Payer: Medicare Other

## 2017-11-24 DIAGNOSIS — J449 Chronic obstructive pulmonary disease, unspecified: Secondary | ICD-10-CM | POA: Diagnosis not present

## 2017-11-24 DIAGNOSIS — D649 Anemia, unspecified: Secondary | ICD-10-CM | POA: Diagnosis not present

## 2017-11-24 DIAGNOSIS — E1165 Type 2 diabetes mellitus with hyperglycemia: Secondary | ICD-10-CM | POA: Diagnosis not present

## 2017-11-24 DIAGNOSIS — I10 Essential (primary) hypertension: Secondary | ICD-10-CM | POA: Diagnosis not present

## 2017-12-03 DIAGNOSIS — I10 Essential (primary) hypertension: Secondary | ICD-10-CM | POA: Diagnosis not present

## 2017-12-03 DIAGNOSIS — E785 Hyperlipidemia, unspecified: Secondary | ICD-10-CM | POA: Diagnosis not present

## 2017-12-03 DIAGNOSIS — F322 Major depressive disorder, single episode, severe without psychotic features: Secondary | ICD-10-CM | POA: Diagnosis not present

## 2017-12-03 DIAGNOSIS — J449 Chronic obstructive pulmonary disease, unspecified: Secondary | ICD-10-CM | POA: Diagnosis not present

## 2017-12-05 ENCOUNTER — Telehealth: Payer: Self-pay

## 2017-12-05 DIAGNOSIS — E210 Primary hyperparathyroidism: Secondary | ICD-10-CM | POA: Diagnosis not present

## 2017-12-05 NOTE — Telephone Encounter (Signed)
   [facility] Pre-operative Risk Assessment    Request for surgical clearance:  1. What type of surgery is being performed? Parathyroidectomy   2. When is this surgery scheduled?  TBD   3. What type of clearance is required (medical clearance vs. Pharmacy clearance to hold med vs. Both)? both  4. Are there any medications that need to be held prior to surgery and how long? Plavix   5. Practice name and name of physician performing surgery?  [facility]/Dr Gerkin   6. What is your office phone number [phone]    7.   What is your office fax number [phone]  8.   Anesthesia type (None, local, MAC, general) ? general   Brian Hudson 12/05/2017, 2:43 PM  _________________________________________________________________   (provider comments below)

## 2017-12-05 NOTE — Telephone Encounter (Signed)
Notes on file, referral sent to epic.

## 2017-12-09 NOTE — Telephone Encounter (Signed)
Left message to call back and make an appt

## 2017-12-09 NOTE — Telephone Encounter (Signed)
   Primary Cardiologist:Mark Marlou Porch, MD  Chart reviewed as part of pre-operative protocol coverage. Because of ERVINE WITUCKI past medical history and time since last visit, he/she will require a follow-up visit in order to better assess preoperative cardiovascular risk.  Pre-op covering staff: - Please schedule appointment and call patient to inform them. - Please contact requesting surgeon's office via preferred method (i.e, phone, fax) to inform them of need for appointment prior to surgery.  [place], [place]  12/09/2017, 3:41 PM

## 2017-12-10 ENCOUNTER — Other Ambulatory Visit: Payer: Self-pay | Admitting: Surgery

## 2017-12-10 DIAGNOSIS — E210 Primary hyperparathyroidism: Secondary | ICD-10-CM

## 2017-12-10 NOTE — Telephone Encounter (Signed)
Spoke with pt re: surgical clearance for his upcoming Parathyroidectomy coming up on 12/15/17. Pt has been made aware that he needed to be seen before he could be cleared. Pt scheduled to see Ellen Henri, PA-C, tomorrow, 12/11/17  Pt thanked me for the call.  Will fax the requesting provider to make them aware.

## 2017-12-11 ENCOUNTER — Encounter: Payer: Self-pay | Admitting: Cardiology

## 2017-12-11 ENCOUNTER — Ambulatory Visit: Payer: Medicare Other | Admitting: Cardiology

## 2017-12-11 VITALS — BP 128/74 | HR 49 | Ht 69.0 in | Wt 166.4 lb

## 2017-12-11 DIAGNOSIS — Z01818 Encounter for other preprocedural examination: Secondary | ICD-10-CM

## 2017-12-11 DIAGNOSIS — Z01810 Encounter for preprocedural cardiovascular examination: Secondary | ICD-10-CM | POA: Diagnosis not present

## 2017-12-11 NOTE — Progress Notes (Signed)
12/11/2017 Brian Hudson   1950-12-04  [id number]  Primary Physician Josetta Huddle, MD Primary Cardiologist: Dr. Marlou Porch  Reason for Visit/CC: Preoperative cardiovascular examination  HPI:  Brian Hudson is a 67 y.o. male who is being seen today for preoperative cardiovascular examination.  He is followed by Dr. Marlou Porch and has a history of coronary artery disease, HTN, HLD and diabetes. He also has Wegener's granulomatosis and CKD.   In June 2018, he underwent evaluation for exertional dyspnea and had an abnormal stress test.  Subsequently he was referred for cardiac catheterization which revealed significant CAD and resulted in DES to proximal left circumflex, and Cutting Balloon angioplasty to the distal OM.  He required repeat cardiac catheterization again in February 2019 for exertional dyspnea. Cath showed non-obstructive diffuse disease in the LAD and severe stenosis in a very small caliber intermediate branch (unchanged from last cath and too small for PCI). There was new severe stenosis in the OM1 just beyond the old stent. This was successfully treated with PTCA/DES x 1. The RCA had nonobstructive disease.  LV systolic function was normal. He has been treated w/ DAPT w/ ASA + Plavix.  Recently, he was discovered to have persistent hypercalcemia.  His nephrologist, Dr. Posey Pronto, conducted a work-up and he was discovered to have parathyroid adenoma.  He was sent to Dr. Harlow Asa to discuss resection.  Recommendations were made to have a parathyroidectomy.  Cardiology asked to evaluate prior to surgery.  He is also needing to hold Plavix prior to procedure.  From a cardiac standpoint, he denies any recurrent exertional dyspnea which is his anginal equivalent.  He also denies any exertional chest pain.  He is able to ambulate a flight of stairs, greater than 4 METS of physical activity, without any cardiac symptoms.  His EKG shows sinus bradycardia with a heart rate of 49 bpm however he reports a long  history of asymptomatic bradycardia and states that his baseline heart rate is usually in the upper 40s to low 50s.  He is not on beta-blocker therapy.  Blood pressure is well controlled at 128/74.   Current Meds  Medication Sig  . acetaminophen (TYLENOL) 325 MG tablet Take 650 mg by mouth daily.   Marland Kitchen amLODipine (NORVASC) 5 MG tablet Take 5 mg by mouth daily.  Marland Kitchen aspirin EC 81 MG tablet Take 81 mg by mouth at bedtime.  . clopidogrel (PLAVIX) 75 MG tablet Take 1 tablet (75 mg total) by mouth daily.  Marland Kitchen escitalopram (LEXAPRO) 10 MG tablet Take 10 mg by mouth daily.  . hydrocortisone cream 1 % Apply 1 application topically daily as needed for itching.  . Multiple Vitamin (MULTIVITAMIN WITH MINERALS) TABS tablet Take 1 tablet by mouth daily.  . nitroGLYCERIN (NITROSTAT) 0.4 MG SL tablet Place 0.4 mg under the tongue every 5 (five) minutes x 3 doses as needed for chest pain.  . pantoprazole (PROTONIX) 40 MG tablet Take 1 tablet (40 mg total) by mouth 2 (two) times daily.  . rosuvastatin (CRESTOR) 10 MG tablet Take 10 mg by mouth daily.  Marland Kitchen SITagliptin Phosphate (JANUVIA PO) Take 1 tablet by mouth daily.   . sodium chloride (OCEAN) 0.65 % SOLN nasal spray Place 1 spray into both nostrils as needed for congestion.  . SYMBICORT 160-4.5 MCG/ACT inhaler INHALE 2 PUFFS INTO THE LUNGS TWICE DAILY   Allergies  Allergen Reactions  . Codeine Other (See Comments)    "jittery"  . Lipitor [Atorvastatin] Other (See Comments)    arthralgia  .  Zetia [Ezetimibe] Other (See Comments)    arthralgia  . Zocor [Simvastatin] Other (See Comments)    arthralgia   Past Medical History:  Diagnosis Date  . Anxiety   . CAD (coronary artery disease)    a. 09/20/16: 45% OM, 99% ostial ramus s/p DES, 80% proximal LCx s/p DES, 80% more distal LCx on AV groove portion of the circumflex after OM1 s/p cutting balloon and PCTA.   Marland Kitchen Concussion   . DM (diabetes mellitus) ([place])    type 2  . ED (erectile dysfunction)   .  Fracture 2010   L 3 and L 4 healed with brace  . GERD (gastroesophageal reflux disease)   . HTN (hypertension)   . Hyperlipidemia   . IBS (irritable bowel syndrome)   . Palpitations    none in last few yrs  . Prostate cancer ([place]) 2007  . Sigmoid diverticulosis   . Wegener's granulomatosis ([place])    followed by dr Berna Bue   Family History  Problem Relation Age of Onset  . Heart attack Father 24  . CAD Father   . Diabetic kidney disease Mother   . Healthy Sister   . Cancer Brother   . Healthy Sister    Past Surgical History:  Procedure Laterality Date  . CARDIAC CATHETERIZATION    . COLONOSCOPY N/A 06/24/2016   Procedure: COLONOSCOPY with propofol;  Surgeon: Garlan Fair, MD;  Location: [facility];  Service: Endoscopy;  Laterality: N/A;  . colonscopy  2008  . CORONARY STENT INTERVENTION N/A 09/20/2016   Procedure: Coronary Stent Intervention;  Surgeon: Leonie Man, MD;  Location: [place] [facility];  Service: Cardiovascular;  Laterality: N/A;  . CORONARY STENT INTERVENTION N/A 05/23/2017   Procedure: CORONARY STENT INTERVENTION;  Surgeon: Burnell Blanks, MD;  Location: [place] [facility];  Service: Cardiovascular;  Laterality: N/A;  . INSERTION PROSTATE RADIATION SEED  2007  . INTRAVASCULAR PRESSURE WIRE/FFR STUDY N/A 09/20/2016   Procedure: Intravascular Pressure Wire/FFR Study;  Surgeon: Leonie Man, MD;  Location: [place] [facility];  Service: Cardiovascular;  Laterality: N/A;  . LEFT HEART CATH AND CORONARY ANGIOGRAPHY N/A 09/20/2016   Procedure: Left Heart Cath and Coronary Angiography;  Surgeon: Leonie Man, MD;  Location: [place] [facility];  Service: Cardiovascular;  Laterality: N/A;  . LEFT HEART CATH AND CORONARY ANGIOGRAPHY N/A 05/23/2017   Procedure: LEFT HEART CATH AND CORONARY ANGIOGRAPHY;  Surgeon: Burnell Blanks, MD;  Location: [place] [facility];  Service: Cardiovascular;  Laterality: N/A;  . ROTATOR CUFF REPAIR Right     Social History   Socioeconomic History  . Marital status: Divorced    Spouse name: Not on file  . Number of children: Not on file  . Years of education: Not on file  . Highest education level: Not on file  Occupational History  . Occupation: Museum/gallery curator  . Financial resource strain: Not on file  . Food insecurity:    Worry: Not on file    Inability: Not on file  . Transportation needs:    Medical: Not on file    Non-medical: Not on file  Tobacco Use  . Smoking status: Former Smoker    Packs/day: 3.00    Years: 35.00    Pack years: 105.00    Types: Cigarettes    Last attempt to quit: 04/09/1991    Years since quitting: 26.6  . Smokeless tobacco: Never Used  Substance and Sexual Activity  .  Alcohol use: No    Alcohol/week: 0.0 standard drinks  . Drug use: Yes    Types: Marijuana    Comment:   marijuana last used 2- weeks ago as of 06-20-16  . Sexual activity: Not on file  Lifestyle  . Physical activity:    Days per week: 0 days    Minutes per session: 0 min  . Stress: To some extent  Relationships  . Social connections:    Talks on phone: Not on file    Gets together: Not on file    Attends religious service: Not on file    Active member of club or organization: Not on file    Attends meetings of clubs or organizations: Not on file    Relationship status: Not on file  . Intimate partner violence:    Fear of current or ex partner: Not on file    Emotionally abused: Not on file    Physically abused: Not on file    Forced sexual activity: Not on file  Other Topics Concern  . Not on file  Social History Narrative  . Not on file     Review of Systems: General: negative for chills, fever, night sweats or weight changes.  Cardiovascular: negative for chest pain, dyspnea on exertion, edema, orthopnea, palpitations, paroxysmal nocturnal dyspnea or shortness of breath Dermatological: negative for rash Respiratory: negative for cough or  wheezing Urologic: negative for hematuria Abdominal: negative for nausea, vomiting, diarrhea, bright red blood per rectum, melena, or hematemesis Neurologic: negative for visual changes, syncope, or dizziness All other systems reviewed and are otherwise negative except as noted above.   Physical Exam:  Blood pressure 128/74, pulse (!) 49, height 5\' 9"  (1.753 m), weight 166 lb 6.4 oz (75.5 kg).  General appearance: alert, cooperative and no distress Neck: no carotid bruit and no JVD Lungs: clear to auscultation bilaterally Heart: regular rate and rhythm, S1, S2 normal, no murmur, click, rub or gallop Extremities: extremities normal, atraumatic, no cyanosis or edema Pulses: 2+ and symmetric Skin: Skin color, texture, turgor normal. No rashes or lesions Neurologic: Grossly normal  EKG sinus bradycardia 49 bpm -- personally reviewed   ASSESSMENT AND PLAN:   1.  Preoperative cardiovascular evaluation: CAD history as outlined above with coronary stent placement utilizing a drug-eluting stent February 2019.  Patient is needing to undergo a parathyroidectomy given recent diagnosis of hypercalcemia secondary to a parathyroid adenoma.  I discussed case with his primary cardiologist Dr. Marlou Porch today.  We feel that this surgery should not be delayed.  With the new generation drug eluding stents, we recommend a minimum of 6 months of DAPT, which he has completed.  He has been without any anginal symptomatology including no exertional dyspnea, which has been his anginal equivalent.  He also denies any exertional chest pain.  He is able to ambulate a flight of stairs without any exertional symptoms.  His EKG shows sinus bradycardia at 49 bpm which is his baseline.  He is asymptomatic in regards to his bradycardia.  His physical exam is benign.  After discussion with Dr. Marlou Porch, we feel that the patient can be cleared for parathyroidectomy without any need for any further cardiac testing.  Since he has  completed 6 months of dual antiplatelet therapy, it would be okay for him to hold Plavix 5 days prior to the procedure.  We would prefer, if possible, for him to continue aspirin during the perioperative.  However, if aspirin absolutely needs to be held, it  would be okay to do so prior to surgery.  We recommend patient resume aspirin and Plavix as soon as possible following surgery.  We will notify Dr. Harlow Asa of recommendations.  Given his baseline bradycardia, we recommend close monitoring of heart rate with anesthesia.   Follow-Up in 6 months w/ Dr. Ezequiel Essex Ladoris Gene, [facility] 12/11/2017 3:52 PM

## 2017-12-11 NOTE — Patient Instructions (Addendum)
Medication Instructions:  Your physician recommends that you continue on your current medications as directed. Please refer to the Current Medication list given to you today.   Labwork: -None  Testing/Procedures: -None  Follow-Up: Your physician wants you to follow-up in: 6 months with Dr. Marlou Porch.  You will receive a reminder letter in the mail two months in advance. If you don't receive a letter, please call our office to schedule the follow-up appointment.   Any Other Special Instructions Will Be Listed Below (If Applicable). Dr. Kingsley Plan reported on patient's surgical clearance.  Hold plavix (5 days) prior to surgery.  Continue Asprin unless surgeon wants patient to stop than stop Asprin (5 days) prior to procedure.   If you need a refill on your cardiac medications before your next appointment, please call your pharmacy.

## 2017-12-11 NOTE — Telephone Encounter (Signed)
Seen and examined in cardiology clinic today for preoperative cardiovascular evaluation.  Patient is stable from a cardiac standpoint to proceed with surgery without need for any additional cardiac testing.  Please see my progress note from December 11, 2017 for further details.  So discuss patient case and need for surgery with his primary cardiologist Dr. Marlou Porch. After discussion with Dr. Marlou Porch, we feel that the patient can be cleared for parathyroidectomy without any need for any further cardiac testing.  Since he has completed 6 months of dual antiplatelet therapy, it would be okay for him to hold Plavix 5 days prior to the procedure.  We would prefer, if possible, for him to continue aspirin during the perioperative period.  However, if aspirin absolutely needs to be held, it would be okay to do so prior to surgery.  We recommend patient resume aspirin and Plavix as soon as possible following surgery.  We will notify Dr. Harlow Asa of recommendations.  Given his baseline bradycardia, we recommend close monitoring of heart rate with anesthesia.  Lyda Jester , PA-C 12/11/2017  I will fax clearance to requesting provider and will remove from preop pool

## 2017-12-12 ENCOUNTER — Ambulatory Visit: Payer: Medicare Other | Admitting: Cardiology

## 2017-12-15 ENCOUNTER — Ambulatory Visit
Admission: RE | Admit: 2017-12-15 | Discharge: 2017-12-15 | Disposition: A | Discharge status: Home / Self Care | Payer: Medicare Other | Source: Ambulatory Visit | Attending: Surgery | Admitting: Surgery

## 2017-12-15 DIAGNOSIS — E210 Primary hyperparathyroidism: Secondary | ICD-10-CM | POA: Diagnosis not present

## 2017-12-31 ENCOUNTER — Telehealth: Payer: Self-pay

## 2017-12-31 NOTE — Telephone Encounter (Signed)
SENT REFERRAL TO SCHEDULING AND FILED NOTES 

## 2018-01-01 ENCOUNTER — Telehealth: Payer: Self-pay

## 2018-01-01 NOTE — Telephone Encounter (Signed)
   [facility] Pre-operative Risk Assessment    Request for surgical clearance:  1. What type of surgery is being performed? Parathyroid surgery   2. When is this surgery scheduled? TBD   3. What type of clearance is required (medical clearance vs. Pharmacy clearance to hold med vs. Both)? Both  4. Are there any medications that need to be held prior to surgery and how long? Plavix and ASA   5. Practice name and name of physician performing surgery?  Dr. Elmarie Shiley   6. What is your office phone number [phone]    7.   What is your office fax number [phone]  8.   Anesthesia type (None, local, MAC, general) ?        Not specified

## 2018-01-02 NOTE — Telephone Encounter (Signed)
Pt has been seen in office for pre-op visit.  Info was sent to surgery.  I have also sent to Dr. Posey Pronto with nephrology.

## 2018-01-08 DIAGNOSIS — J449 Chronic obstructive pulmonary disease, unspecified: Secondary | ICD-10-CM | POA: Diagnosis not present

## 2018-01-08 DIAGNOSIS — Z0000 Encounter for general adult medical examination without abnormal findings: Secondary | ICD-10-CM | POA: Diagnosis not present

## 2018-01-08 DIAGNOSIS — F322 Major depressive disorder, single episode, severe without psychotic features: Secondary | ICD-10-CM | POA: Diagnosis not present

## 2018-01-08 DIAGNOSIS — E1165 Type 2 diabetes mellitus with hyperglycemia: Secondary | ICD-10-CM | POA: Diagnosis not present

## 2018-01-08 DIAGNOSIS — E785 Hyperlipidemia, unspecified: Secondary | ICD-10-CM | POA: Diagnosis not present

## 2018-01-14 ENCOUNTER — Other Ambulatory Visit: Payer: Self-pay | Admitting: Cardiology

## 2018-01-18 ENCOUNTER — Ambulatory Visit: Payer: Self-pay | Admitting: Surgery

## 2018-01-29 DIAGNOSIS — I129 Hypertensive chronic kidney disease with stage 1 through stage 4 chronic kidney disease, or unspecified chronic kidney disease: Secondary | ICD-10-CM | POA: Diagnosis not present

## 2018-01-29 DIAGNOSIS — N183 Chronic kidney disease, stage 3 (moderate): Secondary | ICD-10-CM | POA: Diagnosis not present

## 2018-01-29 DIAGNOSIS — D631 Anemia in chronic kidney disease: Secondary | ICD-10-CM | POA: Diagnosis not present

## 2018-02-06 DIAGNOSIS — R0602 Shortness of breath: Secondary | ICD-10-CM | POA: Diagnosis not present

## 2018-02-06 DIAGNOSIS — M3130 Wegener's granulomatosis without renal involvement: Secondary | ICD-10-CM | POA: Diagnosis not present

## 2018-02-06 DIAGNOSIS — M150 Primary generalized (osteo)arthritis: Secondary | ICD-10-CM | POA: Diagnosis not present

## 2018-02-06 DIAGNOSIS — Z6824 Body mass index (BMI) 24.0-24.9, adult: Secondary | ICD-10-CM | POA: Diagnosis not present

## 2018-02-10 DIAGNOSIS — M545 Low back pain: Secondary | ICD-10-CM | POA: Diagnosis not present

## 2018-02-19 NOTE — Patient Instructions (Signed)
Brian Hudson  02/19/2018   Your procedure is scheduled on: 02-26-18  Report to [facility] Main  Entrance  Report to admitting at  800 AM    Call this number if you have problems the morning of surgery [phone]   Remember: Do not eat food or drink liquids :After Midnight. BRUSH YOUR TEETH MORNING OF SURGERY AND RINSE YOUR MOUTH OUT, NO CHEWING GUM CANDY OR MINTS.   How to Manage Your Diabetes Before and After Surgery  Why is it important to control my blood sugar before and after surgery? . Improving blood sugar levels before and after surgery helps healing and can limit problems. . A way of improving blood sugar control is eating a healthy diet by: o  Eating less sugar and carbohydrates o  Increasing activity/exercise o  Talking with your doctor about reaching your blood sugar goals . High blood sugars (greater than 180 mg/dL) can raise your risk of infections and slow your recovery, so you will need to focus on controlling your diabetes during the weeks before surgery. . Make sure that the doctor who takes care of your diabetes knows about your planned surgery including the date and location.  How do I manage my blood sugar before surgery? . Check your blood sugar at least 4 times a day, starting 2 days before surgery, to make sure that the level is not too high or low. o Check your blood sugar the morning of your surgery when you wake up and every 2 hours until you get to the Short Stay unit. . If your blood sugar is less than 70 mg/dL, you will need to treat for low blood sugar: o Do not take insulin. o Treat a low blood sugar (less than 70 mg/dL) with  cup of clear juice (cranberry or apple), 4 glucose tablets, OR glucose gel. o Recheck blood sugar in 15 minutes after treatment (to make sure it is greater than 70 mg/dL). If your blood sugar is not greater than 70 mg/dL on recheck, call [phone] for further instructions. . Report your blood sugar to  the short stay nurse when you get to Short Stay.  . If you are admitted to the hospital after surgery: o Your blood sugar will be checked by the staff and you will probably be given insulin after surgery (instead of oral diabetes medicines) to make sure you have good blood sugar levels. o The goal for blood sugar control after surgery is 80-180 mg/dL.   WHAT DO I DO ABOUT MY DIABETES MEDICATION?  Marland Kitchen Do not take oral diabetes medicines (pills) the morning of surgery.  . THE DAY  BEFORE SURGERY,TAKE JANUVIA AS USUAL    . THE MORNING OF SURGERY, DO NOT TAKE YOUR JANUVIA.  Marland Kitchen The day of surgery, do not take other diabetes injectables, including Byetta (exenatide), Bydureon (exenatide ER), Victoza (liraglutide), or Trulicity (dulaglutide).  .       Patient Signature:  Date:   Nurse Signature:  Date:   Reviewed and Endorsed by [facility] Patient Education Committee, August 2015  Take these medicines the morning of surgery with A SIP OF WATER: DUONEB IF NEEDED, SYMBICORT, ROSUVASTATIN (CRESTOR), AMLODIPINE (NORVASC), BUPROPION (ZYBAN), PANTAPRAZOLE (PROTONIX) DO NOT TAKE ANY DIABETIC MEDICATIONS DAY OF YOUR SURGERY  You may not have any metal on your body including hair pins and              piercings  Do not wear jewelry, make-up, lotions, powders or perfumes, deodorant             Do not wear nail polish.  Do not shave  48 hours prior to surgery.              Men may shave face and neck.   Do not bring valuables to the hospital. [place].  Contacts, dentures or bridgework may not be worn into surgery.  Leave suitcase in the car. After surgery it may be brought to your room.     Patients discharged the day of surgery will not be allowed to drive home.  Name and phone number of your driver:  Special Instructions: N/A              Please read over the following fact sheets you were  given: _____________________________________________________________________             [place] - Preparing for Surgery Before surgery, you can play an important role.  Because skin is not sterile, your skin needs to be as free of germs as possible.  You can reduce the number of germs on your skin by washing with CHG (chlorahexidine gluconate) soap before surgery.  CHG is an antiseptic cleaner which kills germs and bonds with the skin to continue killing germs even after washing. Please DO NOT use if you have an allergy to CHG or antibacterial soaps.  If your skin becomes reddened/irritated stop using the CHG and inform your nurse when you arrive at Short Stay. Do not shave (including legs and underarms) for at least 48 hours prior to the first CHG shower.  You may shave your face/neck. Please follow these instructions carefully:  1.  Shower with CHG Soap the night before surgery and the  morning of Surgery.  2.  If you choose to wash your hair, wash your hair first as usual with your  normal  shampoo.  3.  After you shampoo, rinse your hair and body thoroughly to remove the  shampoo.                           4.  Use CHG as you would any other liquid soap.  You can apply chg directly  to the skin and wash                       Gently with a scrungie or clean washcloth.  5.  Apply the CHG Soap to your body ONLY FROM THE NECK DOWN.   Do not use on face/ open                           Wound or open sores. Avoid contact with eyes, ears mouth and genitals (private parts).                       Wash face,  Genitals (private parts) with your normal soap.             6.  Wash thoroughly, paying special attention to the area where your surgery  will be performed.  7.  Thoroughly rinse your body with warm water from the neck down.  8.  DO NOT shower/wash with your normal soap after using and rinsing off  the CHG Soap.                9.  Pat yourself dry with a clean towel.            10.  Wear  clean pajamas.            11.  Place clean sheets on your bed the night of your first shower and do not  sleep with pets. Day of Surgery : Do not apply any lotions/deodorants the morning of surgery.  Please wear clean clothes to the hospital/surgery center.

## 2018-02-19 NOTE — Progress Notes (Signed)
CARDIAC CLEARANCE NOTE [name] NP 12-11-17 Epic HEMAGLOBIN A1C 01-08-18 DR GATES ON CHART LOV DR GATES 01-08-18 ON CHART EKG 12-11-17 Epic  CARDIAC CATH REPORT 05-23-17 Epic CHEST CT 03-14-17 EPIC

## 2018-02-20 ENCOUNTER — Encounter (HOSPITAL_COMMUNITY)
Admission: RE | Admit: 2018-02-20 | Discharge: 2018-02-20 | Disposition: A | Discharge status: Home / Self Care | Payer: Medicare Other | Source: Ambulatory Visit | Attending: Surgery | Admitting: Surgery

## 2018-02-20 ENCOUNTER — Encounter (HOSPITAL_COMMUNITY): Payer: Self-pay

## 2018-02-20 ENCOUNTER — Other Ambulatory Visit: Payer: Self-pay

## 2018-02-20 ENCOUNTER — Ambulatory Visit (HOSPITAL_COMMUNITY)
Admission: RE | Admit: 2018-02-20 | Discharge: 2018-02-20 | Disposition: A | Discharge status: Home / Self Care | Payer: Medicare Other | Source: Ambulatory Visit | Attending: Anesthesiology | Admitting: Anesthesiology

## 2018-02-20 DIAGNOSIS — J984 Other disorders of lung: Secondary | ICD-10-CM | POA: Diagnosis not present

## 2018-02-20 DIAGNOSIS — Z01818 Encounter for other preprocedural examination: Secondary | ICD-10-CM

## 2018-02-20 DIAGNOSIS — E119 Type 2 diabetes mellitus without complications: Secondary | ICD-10-CM | POA: Diagnosis not present

## 2018-02-20 HISTORY — DX: Major depressive disorder, single episode, unspecified: F32.9

## 2018-02-20 HISTORY — DX: Peripheral vascular disease, unspecified: I73.9

## 2018-02-20 HISTORY — DX: Cardiac arrhythmia, unspecified: I49.9

## 2018-02-20 HISTORY — DX: Depression, unspecified: F32.A

## 2018-02-20 HISTORY — DX: Dyspnea, unspecified: R06.00

## 2018-02-20 HISTORY — DX: Pneumonia, unspecified organism: J18.9

## 2018-02-20 HISTORY — DX: Unspecified osteoarthritis, unspecified site: M19.90

## 2018-02-20 LAB — CBC
HEMATOCRIT: 40 % (ref 39.0–52.0)
HEMOGLOBIN: 12.8 g/dL — AB (ref 13.0–17.0)
MCH: 29.5 pg (ref 26.0–34.0)
MCHC: 32 g/dL (ref 30.0–36.0)
MCV: 92.2 fL (ref 80.0–100.0)
Platelets: 187 10*3/uL (ref 150–400)
RBC: 4.34 MIL/uL (ref 4.22–5.81)
RDW: 13.2 % (ref 11.5–15.5)
WBC: 5.2 10*3/uL (ref 4.0–10.5)
nRBC: 0 % (ref 0.0–0.2)

## 2018-02-20 LAB — BASIC METABOLIC PANEL
Anion gap: 5 (ref 5–15)
BUN: 16 mg/dL (ref 8–23)
CHLORIDE: 106 mmol/L (ref 98–111)
CO2: 27 mmol/L (ref 22–32)
Calcium: 10.9 mg/dL — ABNORMAL HIGH (ref 8.9–10.3)
Creatinine, Ser: 1.18 mg/dL (ref 0.61–1.24)
GFR calc Af Amer: >60 mL/min (ref >60)
Glucose, Bld: 241 mg/dL — ABNORMAL HIGH (ref 70–99)
POTASSIUM: 4.9 mmol/L (ref 3.5–5.1)
Sodium: 138 mmol/L (ref 135–145)

## 2018-02-20 LAB — GLUCOSE, CAPILLARY: Glucose-Capillary: 246 mg/dL — ABNORMAL HIGH (ref 70–99)

## 2018-02-20 MED ORDER — CHLORHEXIDINE GLUCONATE CLOTH 2 % EX PADS
6.0000 | MEDICATED_PAD | Freq: Once | CUTANEOUS | Status: DC
  Filled 2018-02-20: qty 6

## 2018-02-23 ENCOUNTER — Encounter (HOSPITAL_COMMUNITY): Payer: Self-pay | Admitting: Surgery

## 2018-02-23 DIAGNOSIS — E210 Primary hyperparathyroidism: Secondary | ICD-10-CM | POA: Diagnosis present

## 2018-02-23 NOTE — H&P (Signed)
General Surgery [facility]  Ledon Snare DOB: 04-19-1950 Single / Language: Brian Hudson / Race: White Male   History of Present Illness   The patient is a 67 year old male who presents with primary hyperparathyroidism.  CHIEF COMPLAINT: primary hyperparathyroidism  Patient is referred by Dr. Elmarie Shiley for surgical evaluation and management of primary hyperparathyroidism. Patient has chronic kidney disease, stage III. He was noted on routine laboratory studies to have an elevated calcium level. Recent laboratories show a calcium of 11.3. Intact PTH level was elevated at 59. Vitamin D level was normal at 38.7. Patient was referred for nuclear medicine parathyroid scanning on October 21, 2017. This localized a parathyroid adenoma to the right inferior position. Patient has been symptomatic with bone and joint pain. He is not sure if he has had a bone density scan. He does have memory issues. He is accompanied by his sister who I have operated on in the past for parathyroid disease. She also has hypo-thyroidism. There is no family history of other endocrine neoplasms. I has had no prior head or neck surgery. Patient does have coronary artery disease and has a stent in place. He is followed by Dr. Candee Furbish in cardiology. He is on Plavix and aspirin.   Past Surgical History Prostate Surgery - Removal  Shoulder Surgery  Right. Vasectomy   Diagnostic Studies History Colonoscopy  1-5 years ago  Allergies Codeine/Codeine Derivatives  jitters  Medication History AmLODIPine Besylate (5MG  Tablet, Oral) Active. BuPROPion HCl ER (Smoking Det) (150MG  Tablet ER 12HR, Oral) Active. Clopidogrel Bisulfate (75MG  Tablet, Oral) Active. Ipratropium-Albuterol (0.5-2.5 (3)MG/3ML Solution, Inhalation) Active. Januvia (100MG  Tablet, Oral) Active. Nitroglycerin (0.4MG  Tab Sublingual, Sublingual) Active. Pantoprazole Sodium (40MG  Tablet DR, Oral)  Active. Rosuvastatin Calcium (10MG  Tablet, Oral) Active. Symbicort (160-4.5MCG/ACT Aerosol, Inhalation) Active. Aspirin (81MG  Tablet, Oral) Active. Centrum Silver Ultra Mens (Oral) Active. Medications Reconciled  Social History Alcohol use  Occasional alcohol use. Caffeine use  Carbonated beverages, Coffee, Tea. Tobacco use  Former smoker.  Family History Alcohol Abuse  Father. Cancer  Brother. Depression  Mother. Diabetes Mellitus  Mother. Family history unknown  First Degree Relatives  Heart Disease  Father, Mother. Kidney Disease  Mother. Respiratory Condition  Father, Mother. Thyroid problems  Mother.  Other Problems Anxiety Disorder  Back Pain  Depression  Diabetes Mellitus  Enlarged Prostate  Gastroesophageal Reflux Disease  Hypercholesterolemia  Prostate Cancer   Review of Systems General Not Present- Appetite Loss, Chills, Fatigue, Fever, Night Sweats, Weight Gain and Weight Loss. Skin Not Present- Change in Wart/Mole, Dryness, Hives, Jaundice, New Lesions, Non-Healing Wounds, Rash and Ulcer. HEENT Present- Ringing in the Ears. Not Present- Earache, Hearing Loss, Hoarseness, Nose Bleed, Oral Ulcers, Seasonal Allergies, Sinus Pain, Sore Throat, Visual Disturbances, Wears glasses/contact lenses and Yellow Eyes. Respiratory Not Present- Bloody sputum, Chronic Cough, Difficulty Breathing, Snoring and Wheezing. Breast Not Present- Breast Mass, Breast Pain, Nipple Discharge and Skin Changes. Cardiovascular Not Present- Chest Pain, Difficulty Breathing Lying Down, Leg Cramps, Palpitations, Rapid Heart Rate, Shortness of Breath and Swelling of Extremities. Gastrointestinal Not Present- Abdominal Pain, Bloating, Bloody Stool, Change in Bowel Habits, Chronic diarrhea, Constipation, Difficulty Swallowing, Excessive gas, Gets full quickly at meals, Hemorrhoids, Indigestion, Nausea, Rectal Pain and Vomiting. Male Genitourinary Not Present- Blood in  Urine, Change in Urinary Stream, Frequency, Impotence, Nocturia, Painful Urination, Urgency and Urine Leakage. Musculoskeletal Not Present- Back Pain, Joint Pain, Joint Stiffness, Muscle Pain, Muscle Weakness and Swelling of Extremities. Neurological Not Present- Decreased Memory,  Fainting, Headaches, Numbness, Seizures, Tingling, Tremor, Trouble walking and Weakness. Psychiatric Not Present- Anxiety, Bipolar, Change in Sleep Pattern, Depression, Fearful and Frequent crying. Endocrine Not Present- Cold Intolerance, Excessive Hunger, Hair Changes, Heat Intolerance, Hot flashes and New Diabetes. Hematology Not Present- Blood Thinners, Easy Bruising, Excessive bleeding, Gland problems, HIV and Persistent Infections.  Vitals Weight: 166.6 lb Height: 69in Body Surface Area: 1.91 m Body Mass Index: 24.6 kg/m  Temp.: 28F(Temporal)  Pulse: 61 (Regular)  BP: 130/70 (Sitting, Left Arm, Standard)  Physical Exam   See vital signs recorded above  GENERAL APPEARANCE Development: normal Nutritional status: normal Gross deformities: none  SKIN Rash, lesions, ulcers: none Induration, erythema: none Nodules: none palpable  EYES Conjunctiva and lids: normal Pupils: equal and reactive Iris: normal bilaterally  EARS, NOSE, MOUTH, THROAT External ears: no lesion or deformity External nose: no lesion or deformity Hearing: grossly normal Lips: no lesion or deformity Dentition: normal for age Oral mucosa: moist  NECK Symmetric: yes Trachea: midline Thyroid: no palpable nodules in the thyroid bed  CHEST Respiratory effort: normal Retraction or accessory muscle use: no Breath sounds: normal bilaterally Rales, rhonchi, wheeze: none  CARDIOVASCULAR Auscultation: regular rhythm, normal rate Murmurs: none Pulses: carotid and radial pulse 2+ palpable Lower extremity edema: none Lower extremity varicosities: none  MUSCULOSKELETAL Station and gait: normal Digits and nails:  no clubbing or cyanosis Muscle strength: grossly normal all extremities Range of motion: grossly normal all extremities Deformity: none  LYMPHATIC Cervical: none palpable Supraclavicular: none palpable  PSYCHIATRIC Oriented to person, place, and time: yes Mood and affect: normal for situation Judgment and insight: appropriate for situation    Assessment & Plan  PRIMARY HYPERPARATHYROIDISM (E21.0)  Pt Education - Pamphlet Given - The Parathyroid Surgery Book: discussed with patient and provided information.  Follow Up - Call [facility] office after tests / studies doneto discuss further plans  Patient presents on referral from his nephrologist for evaluation of primary hyperparathyroidism. He is accompanied by his sister. They were provided with written literature on parathyroid disease to review at home.  Diagnostic studies indicate the presence of a right inferior parathyroid adenoma. This is consistent with his biochemical results. I believe he will be a good candidate for minimally invasive parathyroid surgery as an outpatient procedure. We have discussed the procedure. We have discussed the location of the surgical incision. We have discussed the risk and benefits including the potential for recurrent laryngeal nerve injury. We have discussed his postoperative recovery.  I would like to obtain an ultrasound examination of the neck to evaluate the thyroid gland and to possibly image the right inferior parathyroid gland. We will order the study today.  Patient will also require cardiac clearance by his cardiologist. We will need directions on managing his Plavix and aspirin in the perioperative interval.  We will contact the patient once the ultrasound results are available and the report is back from the cardiologist. We will then proceed with scheduling his procedure.  Armandina Gemma, [place] [facility] Office: [phone]

## 2018-02-25 NOTE — Progress Notes (Signed)
Patient called and made aware of time change for surgery on 02/26/2018.  Patient to arrive at 0900am with surgery time of 1100am.  Patient states at time of phone call that he has developed a cough and cold since 02/20/2018.  Has attempted to call office several times along with sister also calling with no return calls from office of Dr Harlow Asa.  Patient reports cough, cold, no fever , nonproductive cough and has not been seen by PCP.  Called [facility] and spoke with Abigail Butts in Triage and made her aware.  Dr Harlow Asa in office today.

## 2018-02-25 NOTE — Anesthesia Preprocedure Evaluation (Addendum)
Anesthesia Evaluation  Patient identified by MRN, date of birth, ID band Patient awake    Reviewed: Allergy & Precautions, NPO status , Patient's Chart, lab work & pertinent test results  History of Anesthesia Complications Negative for: history of anesthetic complications  Airway Mallampati: II  TM Distance: >3 FB Neck ROM: Full    Dental no notable dental hx. (+) Dental Advisory Given, Poor Dentition,    Pulmonary neg pulmonary ROS, former smoker,    Pulmonary exam normal breath sounds clear to auscultation       Cardiovascular hypertension, + CAD (on Plavix) and + Peripheral Vascular Disease  Normal cardiovascular exam Rhythm:Regular Rate:Normal  Most recent cath 05/2017: Non-obstructive diffuse disease in the LAD, severe stenosis very small caliber intermediate branch, severe stenosis OM1 just beyond old stent with DES placed, non-obstructive disease in the large dominant RCA, EF 50-55%    Neuro/Psych Depression negative neurological ROS     GI/Hepatic Neg liver ROS, GERD  Medicated,  Endo/Other  diabetes, Type 2, Oral Hypoglycemic Agents  Renal/GU Renal InsufficiencyRenal diseaseWegener's granulomatosis     Musculoskeletal  (+) Arthritis , Osteoarthritis,    Abdominal   Peds  Hematology  (+) anemia ,   Anesthesia Other Findings Day of surgery medications reviewed with the patient.  Reproductive/Obstetrics                           Anesthesia Physical Anesthesia Plan  ASA: III  Anesthesia Plan: General   Post-op Pain Management:    Induction: Intravenous  PONV Risk Score and Plan: 2 and Treatment may vary due to age or medical condition, Ondansetron and Dexamethasone  Airway Management Planned: Oral ETT  Additional Equipment:   Intra-op Plan:   Post-operative Plan: Extubation in OR  Informed Consent: I have reviewed the patients History and Physical, chart, labs and  discussed the procedure including the risks, benefits and alternatives for the proposed anesthesia with the patient or authorized representative who has indicated his/her understanding and acceptance.   Dental advisory given  Plan Discussed with: CRNA  Anesthesia Plan Comments:        Anesthesia Quick Evaluation

## 2018-02-26 ENCOUNTER — Ambulatory Visit (HOSPITAL_COMMUNITY)
Admission: RE | Admit: 2018-02-26 | Discharge: 2018-02-26 | Disposition: A | Discharge status: Home / Self Care | Payer: Medicare Other | Source: Ambulatory Visit | Attending: Surgery | Admitting: Surgery

## 2018-02-26 ENCOUNTER — Ambulatory Visit (HOSPITAL_COMMUNITY): Payer: Medicare Other | Admitting: Anesthesiology

## 2018-02-26 ENCOUNTER — Encounter (HOSPITAL_COMMUNITY)
Admission: RE | Disposition: A | Discharge status: Home / Self Care | Payer: Self-pay | Source: Ambulatory Visit | Attending: Surgery

## 2018-02-26 ENCOUNTER — Encounter (HOSPITAL_COMMUNITY): Payer: Self-pay | Admitting: Certified Registered"

## 2018-02-26 DIAGNOSIS — Z7982 Long term (current) use of aspirin: Secondary | ICD-10-CM | POA: Insufficient documentation

## 2018-02-26 DIAGNOSIS — N183 Chronic kidney disease, stage 3 (moderate): Secondary | ICD-10-CM | POA: Insufficient documentation

## 2018-02-26 DIAGNOSIS — E210 Primary hyperparathyroidism: Secondary | ICD-10-CM | POA: Diagnosis not present

## 2018-02-26 DIAGNOSIS — Z955 Presence of coronary angioplasty implant and graft: Secondary | ICD-10-CM | POA: Insufficient documentation

## 2018-02-26 DIAGNOSIS — Z79899 Other long term (current) drug therapy: Secondary | ICD-10-CM | POA: Diagnosis not present

## 2018-02-26 DIAGNOSIS — Z87891 Personal history of nicotine dependence: Secondary | ICD-10-CM | POA: Diagnosis not present

## 2018-02-26 DIAGNOSIS — C61 Malignant neoplasm of prostate: Secondary | ICD-10-CM | POA: Insufficient documentation

## 2018-02-26 DIAGNOSIS — E119 Type 2 diabetes mellitus without complications: Secondary | ICD-10-CM | POA: Diagnosis not present

## 2018-02-26 DIAGNOSIS — F329 Major depressive disorder, single episode, unspecified: Secondary | ICD-10-CM | POA: Diagnosis not present

## 2018-02-26 DIAGNOSIS — Z833 Family history of diabetes mellitus: Secondary | ICD-10-CM | POA: Insufficient documentation

## 2018-02-26 DIAGNOSIS — Z8249 Family history of ischemic heart disease and other diseases of the circulatory system: Secondary | ICD-10-CM | POA: Diagnosis not present

## 2018-02-26 DIAGNOSIS — E1122 Type 2 diabetes mellitus with diabetic chronic kidney disease: Secondary | ICD-10-CM | POA: Insufficient documentation

## 2018-02-26 DIAGNOSIS — Z8349 Family history of other endocrine, nutritional and metabolic diseases: Secondary | ICD-10-CM | POA: Insufficient documentation

## 2018-02-26 DIAGNOSIS — Z7951 Long term (current) use of inhaled steroids: Secondary | ICD-10-CM | POA: Diagnosis not present

## 2018-02-26 DIAGNOSIS — Z885 Allergy status to narcotic agent status: Secondary | ICD-10-CM | POA: Diagnosis not present

## 2018-02-26 DIAGNOSIS — K219 Gastro-esophageal reflux disease without esophagitis: Secondary | ICD-10-CM | POA: Diagnosis not present

## 2018-02-26 DIAGNOSIS — Z7902 Long term (current) use of antithrombotics/antiplatelets: Secondary | ICD-10-CM | POA: Insufficient documentation

## 2018-02-26 DIAGNOSIS — Z7984 Long term (current) use of oral hypoglycemic drugs: Secondary | ICD-10-CM | POA: Diagnosis not present

## 2018-02-26 DIAGNOSIS — D351 Benign neoplasm of parathyroid gland: Secondary | ICD-10-CM | POA: Insufficient documentation

## 2018-02-26 DIAGNOSIS — E7800 Pure hypercholesterolemia, unspecified: Secondary | ICD-10-CM | POA: Insufficient documentation

## 2018-02-26 DIAGNOSIS — I10 Essential (primary) hypertension: Secondary | ICD-10-CM | POA: Diagnosis not present

## 2018-02-26 DIAGNOSIS — I2510 Atherosclerotic heart disease of native coronary artery without angina pectoris: Secondary | ICD-10-CM | POA: Diagnosis not present

## 2018-02-26 HISTORY — PX: PARATHYROIDECTOMY: SHX19

## 2018-02-26 LAB — GLUCOSE, CAPILLARY
Glucose-Capillary: 264 mg/dL — ABNORMAL HIGH (ref 70–99)
Glucose-Capillary: 196 mg/dL — ABNORMAL HIGH (ref 70–99)

## 2018-02-26 SURGERY — PARATHYROIDECTOMY
Anesthesia: General | Laterality: Right

## 2018-02-26 MED ORDER — LACTATED RINGERS IV SOLN
INTRAVENOUS | Status: DC
  Administered 2018-02-26 (×2): via INTRAVENOUS

## 2018-02-26 MED ORDER — TRAMADOL HCL 50 MG PO TABS
ORAL_TABLET | ORAL | Status: AC
  Filled 2018-02-26: qty 1

## 2018-02-26 MED ORDER — SUGAMMADEX SODIUM 200 MG/2ML IV SOLN
INTRAVENOUS | Status: DC | PRN
  Administered 2018-02-26: 200 mg via INTRAVENOUS

## 2018-02-26 MED ORDER — ONDANSETRON HCL 4 MG/2ML IJ SOLN
INTRAMUSCULAR | Status: AC
  Filled 2018-02-26: qty 4

## 2018-02-26 MED ORDER — SODIUM CHLORIDE 0.9 % IV SOLN
INTRAVENOUS | Status: DC | PRN
  Administered 2018-02-26: 10 ug/min via INTRAVENOUS

## 2018-02-26 MED ORDER — CEFAZOLIN SODIUM-DEXTROSE 2-4 GM/100ML-% IV SOLN
2.0000 g | INTRAVENOUS | Status: AC
  Administered 2018-02-26: 2 g via INTRAVENOUS
  Filled 2018-02-26: qty 100

## 2018-02-26 MED ORDER — DEXAMETHASONE SODIUM PHOSPHATE 10 MG/ML IJ SOLN
INTRAMUSCULAR | Status: DC | PRN
  Administered 2018-02-26: 4 mg via INTRAVENOUS

## 2018-02-26 MED ORDER — INSULIN ASPART 100 UNIT/ML ~~LOC~~ SOLN
3.0000 [IU] | Freq: Once | SUBCUTANEOUS | Status: AC
  Administered 2018-02-26: 3 [IU] via SUBCUTANEOUS
  Filled 2018-02-26: qty 0.03
  Filled 2018-02-26: qty 1
  Filled 2018-02-26 (×2): qty 0.03

## 2018-02-26 MED ORDER — MIDAZOLAM HCL 2 MG/2ML IJ SOLN
INTRAMUSCULAR | Status: DC | PRN
  Administered 2018-02-26: 2 mg via INTRAVENOUS

## 2018-02-26 MED ORDER — PROPOFOL 10 MG/ML IV BOLUS
INTRAVENOUS | Status: DC | PRN
  Administered 2018-02-26: 150 mg via INTRAVENOUS
  Administered 2018-02-26: 50 mg via INTRAVENOUS

## 2018-02-26 MED ORDER — ROCURONIUM BROMIDE 100 MG/10ML IV SOLN
INTRAVENOUS | Status: AC
  Filled 2018-02-26: qty 2

## 2018-02-26 MED ORDER — BUPIVACAINE HCL 0.25 % IJ SOLN
INTRAMUSCULAR | Status: DC | PRN
  Administered 2018-02-26: 30 mL

## 2018-02-26 MED ORDER — MIDAZOLAM HCL 2 MG/2ML IJ SOLN
INTRAMUSCULAR | Status: AC
  Filled 2018-02-26: qty 2

## 2018-02-26 MED ORDER — PHENYLEPHRINE 40 MCG/ML (10ML) SYRINGE FOR IV PUSH (FOR BLOOD PRESSURE SUPPORT)
PREFILLED_SYRINGE | INTRAVENOUS | Status: AC
  Filled 2018-02-26: qty 10

## 2018-02-26 MED ORDER — PHENYLEPHRINE HCL 10 MG/ML IJ SOLN
INTRAMUSCULAR | Status: AC
  Filled 2018-02-26: qty 1

## 2018-02-26 MED ORDER — LIDOCAINE 2% (20 MG/ML) 5 ML SYRINGE
INTRAMUSCULAR | Status: AC
  Filled 2018-02-26: qty 20

## 2018-02-26 MED ORDER — BUPIVACAINE HCL (PF) 0.5 % IJ SOLN
INTRAMUSCULAR | Status: AC
  Filled 2018-02-26: qty 30

## 2018-02-26 MED ORDER — DEXAMETHASONE SODIUM PHOSPHATE 10 MG/ML IJ SOLN
INTRAMUSCULAR | Status: AC
  Filled 2018-02-26: qty 2

## 2018-02-26 MED ORDER — FENTANYL CITRATE (PF) 250 MCG/5ML IJ SOLN
INTRAMUSCULAR | Status: AC
  Filled 2018-02-26: qty 5

## 2018-02-26 MED ORDER — FENTANYL CITRATE (PF) 250 MCG/5ML IJ SOLN
INTRAMUSCULAR | Status: DC | PRN
  Administered 2018-02-26 (×4): 50 ug via INTRAVENOUS

## 2018-02-26 MED ORDER — FENTANYL CITRATE (PF) 100 MCG/2ML IJ SOLN: 25.0000 ug | INTRAMUSCULAR | Status: DC | PRN

## 2018-02-26 MED ORDER — ONDANSETRON HCL 4 MG/2ML IJ SOLN
INTRAMUSCULAR | Status: DC | PRN
  Administered 2018-02-26: 4 mg via INTRAVENOUS

## 2018-02-26 MED ORDER — EPHEDRINE 5 MG/ML INJ
INTRAVENOUS | Status: AC
  Filled 2018-02-26: qty 10

## 2018-02-26 MED ORDER — SUGAMMADEX SODIUM 200 MG/2ML IV SOLN
INTRAVENOUS | Status: AC
  Filled 2018-02-26: qty 4

## 2018-02-26 MED ORDER — PROMETHAZINE HCL 25 MG/ML IJ SOLN: 6.2500 mg | INTRAMUSCULAR | Status: DC | PRN

## 2018-02-26 MED ORDER — SUCCINYLCHOLINE CHLORIDE 200 MG/10ML IV SOSY
PREFILLED_SYRINGE | INTRAVENOUS | Status: AC
  Filled 2018-02-26: qty 10

## 2018-02-26 MED ORDER — LIDOCAINE 2% (20 MG/ML) 5 ML SYRINGE
INTRAMUSCULAR | Status: DC | PRN
  Administered 2018-02-26: 80 mg via INTRAVENOUS

## 2018-02-26 MED ORDER — OXYCODONE HCL 5 MG PO TABS: 5.0000 mg | ORAL_TABLET | Freq: Once | ORAL | Status: DC | PRN

## 2018-02-26 MED ORDER — ROCURONIUM BROMIDE 10 MG/ML (PF) SYRINGE
PREFILLED_SYRINGE | INTRAVENOUS | Status: DC | PRN
  Administered 2018-02-26: 50 mg via INTRAVENOUS

## 2018-02-26 MED ORDER — OXYCODONE HCL 5 MG/5ML PO SOLN
5.0000 mg | Freq: Once | ORAL | Status: DC | PRN
  Filled 2018-02-26: qty 5

## 2018-02-26 MED ORDER — TRAMADOL HCL 50 MG PO TABS: 50.0000 mg | ORAL_TABLET | Freq: Four times a day (QID) | ORAL | 0 refills | Status: DC | PRN

## 2018-02-26 MED ORDER — ACETAMINOPHEN 10 MG/ML IV SOLN: 1000.0000 mg | Freq: Once | INTRAVENOUS | Status: DC | PRN

## 2018-02-26 MED ORDER — PROPOFOL 10 MG/ML IV BOLUS
INTRAVENOUS | Status: AC
  Filled 2018-02-26: qty 20

## 2018-02-26 SURGICAL SUPPLY — 35 items
ADH SKN CLS APL DERMABOND .7 (GAUZE/BANDAGES/DRESSINGS) ×1
ATTRACTOMAT 16X20 MAGNETIC DRP (DRAPES) ×2 IMPLANT
BLADE SURG 15 STRL LF DISP TIS (BLADE) ×1 IMPLANT
BLADE SURG 15 STRL SS (BLADE) ×2
CHLORAPREP W/TINT 26ML (MISCELLANEOUS) ×2 IMPLANT
CLIP VESOCCLUDE MED 6/CT (CLIP) ×4 IMPLANT
CLIP VESOCCLUDE SM WIDE 6/CT (CLIP) ×4 IMPLANT
COVER SURGICAL LIGHT HANDLE (MISCELLANEOUS) ×2 IMPLANT
COVER WAND RF STERILE (DRAPES) IMPLANT
DERMABOND ADVANCED (GAUZE/BANDAGES/DRESSINGS) ×1
DERMABOND ADVANCED .7 DNX12 (GAUZE/BANDAGES/DRESSINGS) IMPLANT
DRAPE LAPAROTOMY T 98X78 PEDS (DRAPES) ×2 IMPLANT
ELECT PENCIL ROCKER SW 15FT (MISCELLANEOUS) ×2 IMPLANT
ELECT REM PT RETURN 15FT ADLT (MISCELLANEOUS) ×2 IMPLANT
GAUZE 4X4 16PLY RFD (DISPOSABLE) ×2 IMPLANT
GLOVE SURG ORTHO 8.0 STRL STRW (GLOVE) ×2 IMPLANT
GOWN STRL REUS W/TWL XL LVL3 (GOWN DISPOSABLE) ×6 IMPLANT
HEMOSTAT SURGICEL 2X4 FIBR (HEMOSTASIS) ×1 IMPLANT
ILLUMINATOR WAVEGUIDE N/F (MISCELLANEOUS) ×1 IMPLANT
KIT BASIN OR (CUSTOM PROCEDURE TRAY) ×2 IMPLANT
LIGHT WAVEGUIDE WIDE FLAT (MISCELLANEOUS) IMPLANT
NDL HYPO 25X1 1.5 SAFETY (NEEDLE) ×1 IMPLANT
NEEDLE HYPO 25X1 1.5 SAFETY (NEEDLE) ×2 IMPLANT
PACK BASIC VI WITH GOWN DISP (CUSTOM PROCEDURE TRAY) ×2 IMPLANT
POWDER SURGICEL 3.0 GRAM (HEMOSTASIS) IMPLANT
STRIP CLOSURE SKIN 1/2X4 (GAUZE/BANDAGES/DRESSINGS) IMPLANT
SUT MNCRL AB 4-0 PS2 18 (SUTURE) ×2 IMPLANT
SUT SILK 2 0 (SUTURE)
SUT SILK 2-0 18XBRD TIE 12 (SUTURE) IMPLANT
SUT VIC AB 3-0 SH 18 (SUTURE) ×2 IMPLANT
SYR BULB IRRIGATION 50ML (SYRINGE) ×2 IMPLANT
SYR CONTROL 10ML LL (SYRINGE) ×2 IMPLANT
TOWEL OR 17X26 10 PK STRL BLUE (TOWEL DISPOSABLE) ×2 IMPLANT
TOWEL OR NON WOVEN STRL DISP B (DISPOSABLE) ×2 IMPLANT
YANKAUER SUCT BULB TIP 10FT TU (MISCELLANEOUS) ×2 IMPLANT

## 2018-02-26 NOTE — Interval H&P Note (Signed)
History and Physical Interval Note:  02/26/2018 10:34 AM  Brian Hudson  has presented today for surgery, with the diagnosis of Primary hyperparathyroidism.  The various methods of treatment have been discussed with the patient and family. After consideration of risks, benefits and other options for treatment, the patient has consented to    Procedure(s): RIGHT INFERIOR PARATHYROIDECTOMY (Right) as a surgical intervention .    The patient's history has been reviewed, patient examined, no change in status, stable for surgery.  I have reviewed the patient's chart and labs.  Questions were answered to the patient's satisfaction.    Armandina Gemma, [place] [facility] Office: [place]

## 2018-02-26 NOTE — Transfer of Care (Signed)
Immediate Anesthesia Transfer of Care Note  Patient: AZAEL RAGAIN  Procedure(s) Performed: RIGHT INFERIOR PARATHYROIDECTOMY (Right )  Patient Location: PACU  Anesthesia Type:General  Level of Consciousness: awake and alert   Airway & Oxygen Therapy: Patient Spontanous Breathing and Patient connected to face mask oxygen  Post-op Assessment: Report given to RN and Post -op Vital signs reviewed and stable  Post vital signs: Reviewed and stable  Last Vitals:  Vitals Value Taken Time  BP 194/85 02/26/2018 12:11 PM  Temp    Pulse 60 02/26/2018 12:13 PM  Resp 12 02/26/2018 12:13 PM  SpO2 95 % 02/26/2018 12:13 PM  Vitals shown include unvalidated device data.  Last Pain:  Vitals:   02/26/18 0902  TempSrc: Oral         Complications: No apparent anesthesia complications

## 2018-02-26 NOTE — Anesthesia Procedure Notes (Signed)
Date/Time: 02/26/2018 12:02 PM Performed by: Cynda Familia, CRNA Oxygen Delivery Method: Simple face mask Placement Confirmation: positive ETCO2 and breath sounds checked- equal and bilateral Dental Injury: Teeth and Oropharynx as per pre-operative assessment

## 2018-02-26 NOTE — Op Note (Signed)
OPERATIVE REPORT - PARATHYROIDECTOMY  Preoperative diagnosis: Primary hyperparathyroidism  Postop diagnosis: Same  Procedure: Right minimally invasive parathyroidectomy  Surgeon:  Armandina Gemma, MD  Anesthesia: General endotracheal  Estimated blood loss: Minimal  Preparation: ChloraPrep  Indications: Patient is referred by Dr. Elmarie Shiley for surgical evaluation and management of primary hyperparathyroidism. Patient has chronic kidney disease, stage III. He was noted on routine laboratory studies to have an elevated calcium level. Recent laboratories show a calcium of 11.3. Intact PTH level was elevated at 59. Vitamin D level was normal at 38.7. Patient was referred for nuclear medicine parathyroid scanning on October 21, 2017. This localized a parathyroid adenoma to the right inferior position. Patient now comes to surgery for parathyroidectomy.  Procedure: The patient was prepared in the pre-operative holding area. The patient was brought to the operating room and placed in a supine position on the operating room table. Following administration of general anesthesia, the patient was positioned and then prepped and draped in the usual strict aseptic fashion. After ascertaining that an adequate level of anesthesia been achieved, a neck incision was made with a #15 blade. Dissection was carried through subcutaneous tissues and platysma. Hemostasis was obtained with the electrocautery. Skin flaps were developed circumferentially and a Weitlander retractor was placed for exposure.  Strap muscles were incised in the midline. Strap muscles were reflected lateralley exposing the thyroid lobe. With gentle blunt dissection the thyroid lobe was mobilized.  Dissection was carried through adipose tissue and an enlarged parathyroid gland was identified. It was gently mobilized. Vascular structures were divided between small ligaclips. Care was taken to avoid the recurrent laryngeal nerve and the esophagus.  The parathyroid gland was completely excised. It was submitted to pathology where frozen section confirmed parathyroid tissue consistent with adenoma.  Neck was irrigated with warm saline and good hemostasis was noted. Fibrillar was placed in the operative field. Strap muscles were reapproximated in the midline with interrupted 3-0 Vicryl sutures. Platysma was closed with interrupted 3-0 Vicryl sutures. Marcaine was infiltrated circumferentially. Skin was closed with a running 4-0 Monocryl subcuticular suture. Wound was washed and dried and Dermabond was applied. Patient was awakened from anesthesia and brought to the recovery room. The patient tolerated the procedure well.   Armandina Gemma, MD [facility] Office: [phone]

## 2018-02-26 NOTE — Anesthesia Procedure Notes (Signed)

## 2018-02-27 ENCOUNTER — Encounter (HOSPITAL_COMMUNITY): Payer: Self-pay | Admitting: Surgery

## 2018-02-27 NOTE — Anesthesia Postprocedure Evaluation (Signed)
Anesthesia Post Note  Patient: Brian Hudson  Procedure(s) Performed: RIGHT INFERIOR PARATHYROIDECTOMY (Right )     Patient location during evaluation: PACU Anesthesia Type: General Level of consciousness: awake and alert Pain management: pain level controlled Vital Signs Assessment: post-procedure vital signs reviewed and stable Respiratory status: spontaneous breathing, nonlabored ventilation and respiratory function stable Cardiovascular status: blood pressure returned to baseline and stable Postop Assessment: no apparent nausea or vomiting Anesthetic complications: no    Last Vitals:  Vitals:   02/26/18 1230 02/26/18 1245  BP: (!) 164/69 (!) 163/77  Pulse: 64 62  Resp: 12 12  Temp:    SpO2: 95% 93%    Last Pain:  Vitals:   02/26/18 1245  TempSrc:   PainSc: 0-No pain   Pain Goal:                 Audry Pili

## 2018-02-28 DIAGNOSIS — E1165 Type 2 diabetes mellitus with hyperglycemia: Secondary | ICD-10-CM | POA: Diagnosis not present

## 2018-03-02 DIAGNOSIS — E785 Hyperlipidemia, unspecified: Secondary | ICD-10-CM | POA: Diagnosis not present

## 2018-03-02 DIAGNOSIS — F322 Major depressive disorder, single episode, severe without psychotic features: Secondary | ICD-10-CM | POA: Diagnosis not present

## 2018-03-02 DIAGNOSIS — I10 Essential (primary) hypertension: Secondary | ICD-10-CM | POA: Diagnosis not present

## 2018-03-02 DIAGNOSIS — J449 Chronic obstructive pulmonary disease, unspecified: Secondary | ICD-10-CM | POA: Diagnosis not present

## 2018-03-11 ENCOUNTER — Ambulatory Visit (INDEPENDENT_AMBULATORY_CARE_PROVIDER_SITE_OTHER)
Admission: RE | Admit: 2018-03-11 | Discharge: 2018-03-11 | Disposition: A | Discharge status: Home / Self Care | Payer: Medicare Other | Source: Ambulatory Visit | Attending: Internal Medicine | Admitting: Internal Medicine

## 2018-03-11 DIAGNOSIS — R911 Solitary pulmonary nodule: Secondary | ICD-10-CM | POA: Diagnosis not present

## 2018-03-18 DIAGNOSIS — E559 Vitamin D deficiency, unspecified: Secondary | ICD-10-CM | POA: Diagnosis not present

## 2018-03-18 DIAGNOSIS — E210 Primary hyperparathyroidism: Secondary | ICD-10-CM | POA: Diagnosis not present

## 2018-03-18 DIAGNOSIS — E782 Mixed hyperlipidemia: Secondary | ICD-10-CM | POA: Diagnosis not present

## 2018-03-18 DIAGNOSIS — R5383 Other fatigue: Secondary | ICD-10-CM | POA: Diagnosis not present

## 2018-05-05 ENCOUNTER — Telehealth: Payer: Self-pay | Admitting: Internal Medicine

## 2018-05-05 NOTE — Telephone Encounter (Signed)
Pt is aware of results and apt made for 07-10-2018 at 9am. Pt aware that CT will be ordered then for Dec 2020 timeframe. Nothing more needed at this time.

## 2018-05-05 NOTE — Telephone Encounter (Signed)
Pt is returning call. Cb is [phone].

## 2018-05-05 NOTE — Telephone Encounter (Signed)
Notes recorded by Brand Males, MD on 04/27/2018 at 4:29 PM EST Lung nodule in decepmber is slightly smaller v same  Plan - give followup next 2 - 62months - next CT chest without contrast in dec 2020 ------------------------------------------------------------------ LMTCB x1 for pt.

## 2018-06-05 DIAGNOSIS — J449 Chronic obstructive pulmonary disease, unspecified: Secondary | ICD-10-CM | POA: Diagnosis not present

## 2018-06-05 DIAGNOSIS — E785 Hyperlipidemia, unspecified: Secondary | ICD-10-CM | POA: Diagnosis not present

## 2018-06-05 DIAGNOSIS — I10 Essential (primary) hypertension: Secondary | ICD-10-CM | POA: Diagnosis not present

## 2018-06-05 DIAGNOSIS — F322 Major depressive disorder, single episode, severe without psychotic features: Secondary | ICD-10-CM | POA: Diagnosis not present

## 2018-06-08 ENCOUNTER — Other Ambulatory Visit: Payer: Self-pay | Admitting: *Deleted

## 2018-06-08 MED ORDER — NITROGLYCERIN 0.4 MG SL SUBL: 0.4000 mg | SUBLINGUAL_TABLET | SUBLINGUAL | 5 refills | Status: DC | PRN

## 2018-06-09 ENCOUNTER — Ambulatory Visit: Payer: Medicare Other | Admitting: Cardiology

## 2018-06-09 ENCOUNTER — Encounter: Payer: Self-pay | Admitting: Cardiology

## 2018-06-09 VITALS — BP 122/62 | HR 60 | Ht 69.0 in | Wt 166.0 lb

## 2018-06-09 DIAGNOSIS — I209 Angina pectoris, unspecified: Secondary | ICD-10-CM

## 2018-06-09 DIAGNOSIS — I25119 Atherosclerotic heart disease of native coronary artery with unspecified angina pectoris: Secondary | ICD-10-CM

## 2018-06-09 DIAGNOSIS — E785 Hyperlipidemia, unspecified: Secondary | ICD-10-CM

## 2018-06-09 DIAGNOSIS — Z9861 Coronary angioplasty status: Secondary | ICD-10-CM

## 2018-06-09 MED ORDER — ISOSORBIDE MONONITRATE ER 30 MG PO TB24: 30.0000 mg | ORAL_TABLET | Freq: Every day | ORAL | 3 refills | Status: DC

## 2018-06-09 NOTE — Progress Notes (Signed)
Cardiology Office Note:    Date:  06/09/2018   ID:  Brian Hudson, DOB 12-24-1950, MRN [id number]  PCP:  Josetta Huddle, MD  Cardiologist:  Candee Furbish, MD    Referring MD: Josetta Huddle, MD     History of Present Illness:    Brian Hudson is a 68 y.o. male with coronary artery disease status post recent obtuse marginal stent placement on 05/23/17 just beyond previously placed stent here for follow-up.  He is doing quite well.   During a prior workup underwent nuclear stress test at the request of [name], NP that demonstrated a markedly abnormal exercise treadmill. Marked ST segment depression, 2 mm diffuse. AVR elevation. His nuclear imaging was normal however. EF 53%. No perfusion defects. No transient ischemic dilatation.  Because of these findings, he ended up having a cardiac catheterization which did reveal significant CAD and resulted in DES to proximal left circumflex, and Cutting Balloon distal OM.  The most recent cardiopulmonary stress test was performed because of coronary calcification seen on CT scan. [name] also had seen him and noted that he did not desaturate. He was concerned about possible chronotropic incompetence. His heart rate at baseline was in the 40s.  His father had heart attack at age 35. Coronary calcifications are noted in the LAD and circumflex arteries.  05/15/17 -He saw [name] who performed a cardiopulmonary function study and this demonstrated ST segment depression once again on the ECG portion with some difficulty getting his heart rate increased.  Could be medication related.  With his ongoing dyspnea symptoms, we have decided to pursue a cardiac catheterization.  06/06/17 -Underwent cardiac catheterization, new stent placed in obtuse marginal just beyond previous stent.  He states that he feels much better.  Decreased anginal symptoms.  No bleeding, no syncope.  The below for details.  06/09/2018 - Had an episode of about 15 to 20 minutes of  central chest pain once again. Happened when laying down. ? MSK. Sometimes out in yard. NTG once and ?stopped in.   Resolved on its own.  Prior cardiac catheterization in 2019 resulted in circumflex stent. Trying Jardiance. Felt some nausea after coffee. Talking with Tammy at Dr. Inda Merlin.  He is going to try isosorbide.  Denies any shortness of breath, orthopnea PND syncope bleeding.  Past Medical History:  Diagnosis Date  . Arthritis    osteoarthritis   . CAD (coronary artery disease)    a. 09/20/16: 45% OM, 99% ostial ramus s/p DES, 80% proximal LCx s/p DES, 80% more distal LCx on AV groove portion of the circumflex after OM1 s/p cutting balloon and PCTA.   Marland Kitchen Concussion   . Depression   . DM (diabetes mellitus) ([place])    type 2  . Dyspnea    with exertion   . Dysrhythmia    hx of atrial fib- 15-20 years ago   . ED (erectile dysfunction)   . Fracture 2010   L 3 and L 4 healed with brace  . GERD (gastroesophageal reflux disease)   . HTN (hypertension)   . Hyperlipidemia   . IBS (irritable bowel syndrome)   . Palpitations    none in last few yrs  . Peripheral vascular disease (HCC)    legs   . Pneumonia    hx of   . Prostate cancer ([place]) 2007  . Sigmoid diverticulosis   . Wegener's granulomatosis ([place])    followed by dr Berna Bue    Past Surgical History:  Procedure Laterality Date  . CARDIAC CATHETERIZATION    . COLONOSCOPY N/A 06/24/2016   Procedure: COLONOSCOPY with propofol;  Surgeon: Garlan Fair, MD;  Location: [facility];  Service: Endoscopy;  Laterality: N/A;  . colonscopy  2008  . CORONARY STENT INTERVENTION N/A 09/20/2016   Procedure: Coronary Stent Intervention;  Surgeon: Leonie Man, MD;  Location: [place] [facility];  Service: Cardiovascular;  Laterality: N/A;  . CORONARY STENT INTERVENTION N/A 05/23/2017   Procedure: CORONARY STENT INTERVENTION;  Surgeon: Burnell Blanks, MD;  Location: [place] [facility];  Service: Cardiovascular;  Laterality:  N/A;  . INSERTION PROSTATE RADIATION SEED  2007  . INTRAVASCULAR PRESSURE WIRE/FFR STUDY N/A 09/20/2016   Procedure: Intravascular Pressure Wire/FFR Study;  Surgeon: Leonie Man, MD;  Location: [place] [facility];  Service: Cardiovascular;  Laterality: N/A;  . LEFT HEART CATH AND CORONARY ANGIOGRAPHY N/A 09/20/2016   Procedure: Left Heart Cath and Coronary Angiography;  Surgeon: Leonie Man, MD;  Location: [place] [facility];  Service: Cardiovascular;  Laterality: N/A;  . LEFT HEART CATH AND CORONARY ANGIOGRAPHY N/A 05/23/2017   Procedure: LEFT HEART CATH AND CORONARY ANGIOGRAPHY;  Surgeon: Burnell Blanks, MD;  Location: [place] [facility];  Service: Cardiovascular;  Laterality: N/A;  . PARATHYROIDECTOMY Right 02/26/2018   Procedure: RIGHT INFERIOR PARATHYROIDECTOMY;  Surgeon: Armandina Gemma, MD;  Location: [facility];  Service: General;  Laterality: Right;  . ROTATOR CUFF REPAIR Right     Current Medications: Current Meds  Medication Sig  . acetaminophen (TYLENOL) 325 MG tablet Take 650 mg by mouth every morning.   Marland Kitchen amLODipine (NORVASC) 5 MG tablet Take 5 mg by mouth daily.  Marland Kitchen aspirin EC 81 MG tablet Take 81 mg by mouth at bedtime.  Marland Kitchen buPROPion (ZYBAN) 150 MG 12 hr tablet Take 150 mg by mouth every morning.  . clopidogrel (PLAVIX) 75 MG tablet TAKE 1 TABLET BY MOUTH DAILY  . hydrocortisone cream 1 % Apply 1 application topically daily as needed for itching.  Marland Kitchen ipratropium-albuterol (DUONEB) 0.5-2.5 (3) MG/3ML SOLN Take 3 mLs by nebulization every 6 (six) hours as needed (wheezing or shortness of breath).  . meloxicam (MOBIC) 15 MG tablet Take 15 mg by mouth daily.  . Multiple Vitamin (MULTIVITAMIN WITH MINERALS) TABS tablet Take 1 tablet by mouth daily.  . nitroGLYCERIN (NITROSTAT) 0.4 MG SL tablet Place 1 tablet (0.4 mg total) under the tongue every 5 (five) minutes x 3 doses as needed for chest pain.  . pantoprazole (PROTONIX) 40 MG tablet Take 1 tablet (40 mg total) by mouth  2 (two) times daily.  . rosuvastatin (CRESTOR) 5 MG tablet Take 5 mg by mouth daily.   . sitaGLIPtin (JANUVIA) 100 MG tablet Take 100 mg by mouth daily.   . sodium chloride (OCEAN) 0.65 % SOLN nasal spray Place 1 spray into both nostrils daily as needed for congestion.   . SYMBICORT 160-4.5 MCG/ACT inhaler INHALE 2 PUFFS INTO THE LUNGS TWICE DAILY  . traMADol (ULTRAM) 50 MG tablet Take 1-2 tablets (50-100 mg total) by mouth every 6 (six) hours as needed.     Allergies:   Codeine; Lipitor [atorvastatin]; Zetia [ezetimibe]; and Zocor [simvastatin]   Social History   Socioeconomic History  . Marital status: Divorced    Spouse name: Not on file  . Number of children: Not on file  . Years of education: Not on file  . Highest education level: Not on file  Occupational History  . Occupation:  Contractor  Social Needs  . Financial resource strain: Not on file  . Food insecurity:    Worry: Not on file    Inability: Not on file  . Transportation needs:    Medical: Not on file    Non-medical: Not on file  Tobacco Use  . Smoking status: Former Smoker    Packs/day: 3.00    Years: 35.00    Pack years: 105.00    Types: Cigarettes    Last attempt to quit: 04/09/1991    Years since quitting: 27.1  . Smokeless tobacco: Never Used  Substance and Sexual Activity  . Alcohol use: No    Alcohol/week: 0.0 standard drinks  . Drug use: Never    Comment:   marijuana last used 2- weeks ago as of 06-20-16  . Sexual activity: Not on file  Lifestyle  . Physical activity:    Days per week: 0 days    Minutes per session: 0 min  . Stress: To some extent  Relationships  . Social connections:    Talks on phone: Not on file    Gets together: Not on file    Attends religious service: Not on file    Active member of club or organization: Not on file    Attends meetings of clubs or organizations: Not on file    Relationship status: Not on file  Other Topics Concern  . Not on file  Social History  Narrative  . Not on file     Family History: The patient's family history includes CAD in his father; Cancer in his brother; Diabetic kidney disease in his mother; Healthy in his sister and sister; Heart attack (age of onset: 56) in his father. ROS:   Please see the history of present illness.    As above  EKGs/Labs/Other Studies Reviewed:    The following studies were reviewed today: Nuclear stress test as described above. Markedly abnormal ETT with ST segment depression. Perfusion images are normal.  Cath 0/94/70:  LV end diastolic pressure is normal.  1st Mrg lesion, 45 %stenosed.  Ost Ramus lesion, 99 %stenosed. Vessel is less than 1.5 mm  Culprit lesion: Bifurcation Cx-OM1 (1,1,1) lesion  A STENT SYNERGY DES 2.25X12 drug eluting stent was successfully placed.  Prox Cx-1 lesion, 80 %stenosed involving Ost 1st Mrg lesion, 80 %stenosed. ([place] 1,1,1)  A STENT SYNERGY DES 2.25X12 drug eluting stent was successfully placed crossing from the main Cx into OM1. Postdilated to 2.5 mm  Post intervention, there is a 0% residual stenosis.  Prox Cx-2 lesion, 80 %stenosed in the follow on AV groove portion of the circumflex after OM1. The bifurcation lesion.  Post Cutting Balloon then post PCI of OM PTCA intervention, there is a 10% residual stenosis.   Successful bifurcation PCI/PTCA of the circumflex into OM1 and into the AV groove circumflex for PTCA. OM1 is a larger vessel than the follow on AV groove circumflex proximal therefore decided to stent into the OM and PTCA the ostium.  Mild to moderate disease elsewhere. Normal LVEDP.  Plan:  Patient will need to be on aspirin plus Plavix for at least 6 months. I used a Synergy stent in order to allow for potentially safe stopping of the Plavix if necessary for Wegener's-related bleeding.  Patient was transferred back to the short stay holding area for TR band removal. If stable after bedrest, he would be ready for discharge  today as per same day discharge planning.  Defer further management to Dr. Marlou Porch.  The patient is intolerant of statins, and is bradycardic at baseline precluding beta blocker use.    Glenetta Hew, M.D., M.S.  Cardiac cath 05/23/17: 1. Non-obstructive diffuse disease in the LAD 2. Severe stenosis very small caliber intermediate branch. Unchanged from last cath and too small for PCI 3. Severe stenosis OM1 just beyond old stent. Successful PTCA/DES x 1 OM1.  4. Non-obstructive disease in the large dominant RCA 5. Normal LV systolic function.   Recommendations: Continue ASA, Plavix and statin. Same day discharge if stable.    Barium swallow: 04/17/17 Fluoroscopic evaluation of swallowing demonstrates disruption of 3 out of 4 primary esophageal peristaltic waves. There is a small hiatal hernia. Mild mucosal ring in the distal esophagus. No reflux with the water siphon maneuver. The patient swallowed a 13 mm barium tablet which freely passed into the stomach.  IMPRESSION: Nonspecific esophageal motility disorder.  Small hiatal hernia.  Mild mucosal ring in the distal esophagus. The 13 mm barium tablet freely passes into the stomach.  Cardiopulmonary exercise test: 05/12/17:  Conclusion: Exercise testing with gas exchange demonstrates low-normal functional capacity when compared to matched sedentary norms. There is no indication for ventilatory limitation. Several parameters indicate and suggest cardiovascular ischemia is a likely the primary factor in patient's intolerance. There was ST-depression in the absence of chest pain or dizziness at higher intensity exercise and at peak workload. There was a hypertensive response and chronotropic incompetence simultaneously during exercise. Study should be clinically correlated accordingly.   EKG:  EKG is not ordered today. Baseline ECG from exercise treadmill test shows sinus rhythm heart rate 49. Personally viewed.  Recent  Labs: 08/06/2017: ALT 24 02/20/2018: BUN 16; Creatinine, Ser 1.18; Hemoglobin 12.8; Platelets 187; Potassium 4.9; Sodium 138   Recent Lipid Panel    Component Value Date/Time   CHOL 120 08/06/2017 0912   TRIG 110 08/06/2017 0912   HDL 33 (L) 08/06/2017 0912   CHOLHDL 3.6 08/06/2017 0912   LDLCALC 65 08/06/2017 0912    Physical Exam:    VS:  BP 122/62   Pulse 60   Ht 5\' 9"  (1.753 m)   Wt 166 lb (75.3 kg)   SpO2 98%   BMI 24.51 kg/m     Wt Readings from Last 3 Encounters:  06/09/18 166 lb (75.3 kg)  02/26/18 162 lb (73.5 kg)  02/20/18 162 lb (73.5 kg)    GEN: Well nourished, well developed, in no acute distress  HEENT: normal  Neck: no JVD, carotid bruits, or masses Cardiac: RRR; no murmurs, rubs, or gallops,no edema  Respiratory:  clear to auscultation bilaterally, normal work of breathing GI: soft, nontender, nondistended, + BS MS: no deformity or atrophy  Skin: warm and dry, no rash Neuro:  Alert and Oriented x 3, Strength and sensation are intact Psych: euthymic mood, full affect   ASSESSMENT:    1. Coronary artery disease with angina pectoris, unspecified vessel or lesion type, unspecified whether native or transplanted heart ([place])   2. Angina, class III ([place])   3. S/P PTCA (percutaneous transluminal coronary angioplasty)   4. Hyperlipidemia, unspecified hyperlipidemia type    PLAN:    In order of problems listed above:  Coronary artery disease status post circumflex stent June 2018 and 2019 with angina -   With his ongoing symptoms, mostly shortness of breath and central chest pain , and recent cardiopulmonary function study that was abnormal resulting in repeat catheterization on 05/23/17 which PCI to circumflex once again was performed with lesion just  distal to prior circumflex stent.  He states that this has improved his anginal symptoms.  However, he has experienced some occasional discomfort both at rest when laying down which he thinks may be  musculoskeletal and sometimes when working out in the yard.  We decided to go ahead and start isosorbide 30 mg once a day.  Antianginal approach.  I like to continue to do this to see if we can help stabilize his stable angina.  Dysphagia  - He is also battling GERD but the doubling up of Protonix seems to have helped. Barium swallow as above. Continue to follow up GI.  No changes made today.  Could be contributing to his symptoms.  Coronary artery calcification  - LAD and circumflex, continue with Crestor, in the past was not able to tolerate higher dosing.  LDL goal less than 70.  Last LDL 90 in October 2018.  He has had trouble with Zetia and Zocor and Lipitor in the past.  He is not felt any problems with Crestor thus far.  LDL last check was 65 less than 70.  Excellent.  No changes  Diabetes with coronary artery disease  -Aggressive prevention.  Goal hemoglobin A1c less than 7.  Last check 7.5.  October 2018.  He held his metformin because of his vomiting issues.  Januvia replaced it.  Agree that Vania Rea would be a good option for him.  Reduction in cardiovascular risk.  Edema with rash  - amlodipine cut from 10 to 5 and this helped with his lower extremity edema and rash.  He still using some cortisone cream on his lower extremity.  History of Wegener's - [name] doubts that his sinus issue was due to [facility].  Do not know why he has a chronic sinus issue however.  He also has a nodule of left lung which is not think his Wegener's.  Following with CT.    Mickel Baas in 3 months   Medication Adjustments/Labs and Tests Ordered: Current medicines are reviewed at length with the patient today.  Concerns regarding medicines are outlined above. Labs and tests ordered and medication changes are outlined in the patient instructions below:  Patient Instructions  Medication Instructions:  Please start Isosorbide 30 mg daily. Continue all other medications as listed.  If you need a refill  on your cardiac medications before your next appointment, please call your pharmacy.   Follow-Up: At [facility], you and your health needs are our priority.  As part of our continuing mission to provide you with exceptional heart care, we have created designated Provider Care Teams.  These Care Teams include your primary Cardiologist (physician) and Advanced Practice Providers (APPs -  Physician Assistants and Nurse Practitioners) who all work together to provide you with the care you need, when you need it. You will need a follow up appointment in 3 months with [name], NP.  Please call our office 2 months in advance to schedule this appointment.  You may see Candee Furbish, MD or one of the following Advanced Practice Providers on your designated Care Team:   Truitt Merle, NP [name], NP . Kathyrn Drown, NP  Thank you for choosing [facility]!!         Signed, Candee Furbish, MD  06/09/2018 9:05 AM    [place]

## 2018-06-09 NOTE — Patient Instructions (Signed)
Medication Instructions:  Please start Isosorbide 30 mg daily. Continue all other medications as listed.  If you need a refill on your cardiac medications before your next appointment, please call your pharmacy.   Follow-Up: At [facility], you and your health needs are our priority.  As part of our continuing mission to provide you with exceptional heart care, we have created designated Provider Care Teams.  These Care Teams include your primary Cardiologist (physician) and Advanced Practice Providers (APPs -  Physician Assistants and Nurse Practitioners) who all work together to provide you with the care you need, when you need it. You will need a follow up appointment in 3 months with [name], NP.  Please call our office 2 months in advance to schedule this appointment.  You may see Candee Furbish, MD or one of the following Advanced Practice Providers on your designated Care Team:   Truitt Merle, NP [name], NP . Kathyrn Drown, NP  Thank you for choosing [facility]!!

## 2018-07-07 DIAGNOSIS — I10 Essential (primary) hypertension: Secondary | ICD-10-CM | POA: Diagnosis not present

## 2018-07-07 DIAGNOSIS — E785 Hyperlipidemia, unspecified: Secondary | ICD-10-CM | POA: Diagnosis not present

## 2018-07-07 DIAGNOSIS — J449 Chronic obstructive pulmonary disease, unspecified: Secondary | ICD-10-CM | POA: Diagnosis not present

## 2018-07-07 DIAGNOSIS — F322 Major depressive disorder, single episode, severe without psychotic features: Secondary | ICD-10-CM | POA: Diagnosis not present

## 2018-07-09 DIAGNOSIS — E1165 Type 2 diabetes mellitus with hyperglycemia: Secondary | ICD-10-CM | POA: Diagnosis not present

## 2018-07-09 DIAGNOSIS — I2510 Atherosclerotic heart disease of native coronary artery without angina pectoris: Secondary | ICD-10-CM | POA: Diagnosis not present

## 2018-07-09 DIAGNOSIS — F322 Major depressive disorder, single episode, severe without psychotic features: Secondary | ICD-10-CM | POA: Diagnosis not present

## 2018-07-10 ENCOUNTER — Ambulatory Visit: Payer: Medicare Other | Admitting: Internal Medicine

## 2018-07-11 ENCOUNTER — Other Ambulatory Visit: Payer: Self-pay | Admitting: Cardiology

## 2018-07-13 NOTE — Telephone Encounter (Signed)
Pt was just seen by Dr Marlou Porch 06/09/18 and was instructed to continue this medication.  Please refill as per protocol.

## 2018-08-06 DIAGNOSIS — E1165 Type 2 diabetes mellitus with hyperglycemia: Secondary | ICD-10-CM | POA: Diagnosis not present

## 2018-08-06 DIAGNOSIS — F322 Major depressive disorder, single episode, severe without psychotic features: Secondary | ICD-10-CM | POA: Diagnosis not present

## 2018-08-06 DIAGNOSIS — I2510 Atherosclerotic heart disease of native coronary artery without angina pectoris: Secondary | ICD-10-CM | POA: Diagnosis not present

## 2018-08-07 DIAGNOSIS — E1165 Type 2 diabetes mellitus with hyperglycemia: Secondary | ICD-10-CM | POA: Diagnosis not present

## 2018-08-19 ENCOUNTER — Telehealth: Payer: Self-pay | Admitting: Cardiology

## 2018-08-19 NOTE — Telephone Encounter (Signed)
New message     Called to convert 09-02-18 office visit to a virtual video visit.  Pt gave consent for video visit and he will have bp readings for nurse.  YOUR CARDIOLOGY TEAM HAS ARRANGED FOR AN E-VISIT FOR YOUR APPOINTMENT - PLEASE REVIEW IMPORTANT INFORMATION BELOW SEVERAL DAYS PRIOR TO YOUR APPOINTMENT  Due to the recent COVID-19 pandemic, we are transitioning in-person office visits to tele-medicine visits in an effort to decrease unnecessary exposure to our patients, their families, and staff. These visits are billed to your insurance just like a normal visit is. We also encourage you to sign up for MyChart if you have not already done so. You will need a smartphone if possible. For patients that do not have this, we can still complete the visit using a regular telephone but do prefer a smartphone to enable video when possible. You may have a family member that lives with you that can help. If possible, we also ask that you have a blood pressure cuff and scale at home to measure your blood pressure, heart rate and weight prior to your scheduled appointment. Patients with clinical needs that need an in-person evaluation and testing will still be able to come to the office if absolutely necessary. If you have any questions, feel free to call our office.     YOUR PROVIDER WILL BE USING THE FOLLOWING PLATFORM TO COMPLETE YOUR VISIT:  Doximity   IF USING MYCHART - How to Download the MyChart App to Your SmartPhone   - If Apple, go to [employer] and type in MyChart in the search bar and download the app. If Android, ask patient to go to [employer] and type in [place] in the search bar and download the app. The app is free but as with any other app downloads, your phone may require you to verify saved payment information or Apple/Android password.  - You will need to then log into the app with your MyChart username and password, and select [facility] as your healthcare provider to link the  account.  - When it is time for your visit, go to the MyChart app, find appointments, and click Begin Video Visit. Be sure to Select Allow for your device to access the Microphone and Camera for your visit. You will then be connected, and your provider will be with you shortly.  **If you have any issues connecting or need assistance, please contact MyChart service desk [phone])**  **If using a computer, in order to ensure the best quality for your visit, you will need to use either of the following Internet Browsers: Insurance underwriter or Microsoft Edge**   IF USING DOXIMITY or [email or web address] - The staff will give you instructions on receiving your link to join the meeting the day of your visit.      2-3 DAYS BEFORE YOUR APPOINTMENT  You will receive a telephone call from one of our [place] team members - your caller ID may say "Unknown caller." If this is a video visit, we will walk you through how to get the video launched on your phone. We will remind you check your blood pressure, heart rate and weight prior to your scheduled appointment. If you have an Apple Watch or Kardia, please upload any pertinent ECG strips the day before or morning of your appointment to [facility]. Our staff will also make sure you have reviewed the consent and agree to move forward with your scheduled tele-health visit.  THE DAY OF YOUR APPOINTMENT  Approximately 15 minutes prior to your scheduled appointment, you will receive a telephone call from one of [facility] team - your caller ID may say "Unknown caller."  Our staff will confirm medications, vital signs for the day and any symptoms you may be experiencing. Please have this information available prior to the time of visit start. It may also be helpful for you to have a pad of paper and pen handy for any instructions given during your visit. They will also walk you through joining the smartphone meeting if this is a video visit.    CONSENT FOR  TELE-HEALTH VISIT - PLEASE REVIEW  I hereby voluntarily request, consent and authorize [facility] and its employed or contracted physicians, physician assistants, nurse practitioners or other licensed health care professionals (the Practitioner), to provide me with telemedicine health care services (the Services") as deemed necessary by the treating Practitioner. I acknowledge and consent to receive the Services by the Practitioner via telemedicine. I understand that the telemedicine visit will involve communicating with the Practitioner through live audiovisual communication technology and the disclosure of certain medical information by electronic transmission. I acknowledge that I have been given the opportunity to request an in-person assessment or other available alternative prior to the telemedicine visit and am voluntarily participating in the telemedicine visit.  I understand that I have the right to withhold or withdraw my consent to the use of telemedicine in the course of my care at any time, without affecting my right to future care or treatment, and that the Practitioner or I may terminate the telemedicine visit at any time. I understand that I have the right to inspect all information obtained and/or recorded in the course of the telemedicine visit and may receive copies of available information for a reasonable fee.  I understand that some of the potential risks of receiving the Services via telemedicine include:   Delay or interruption in medical evaluation due to technological equipment failure or disruption;  Information transmitted may not be sufficient (e.g. poor resolution of images) to allow for appropriate medical decision making by the Practitioner; and/or   In rare instances, security protocols could fail, causing a breach of personal health information.  Furthermore, I acknowledge that it is my responsibility to provide information about my medical history, conditions and  care that is complete and accurate to the best of my ability. I acknowledge that Practitioner's advice, recommendations, and/or decision may be based on factors not within their control, such as incomplete or inaccurate data provided by me or distortions of diagnostic images or specimens that may result from electronic transmissions. I understand that the practice of medicine is not an exact science and that Practitioner makes no warranties or guarantees regarding treatment outcomes. I acknowledge that I will receive a copy of this consent concurrently upon execution via email to the email address I last provided but may also request a printed copy by calling the office of [facility].    I understand that my insurance will be billed for this visit.   I have read or had this consent read to me.  I understand the contents of this consent, which adequately explains the benefits and risks of the Services being provided via telemedicine.   I have been provided ample opportunity to ask questions regarding this consent and the Services and have had my questions answered to my satisfaction.  I give my informed consent for the services to be provided through the  use of telemedicine in my medical care  By participating in this telemedicine visit I agree to the above.

## 2018-08-23 IMAGING — NM NM PARATHYROID W/ SPECT
5 series · 20 of 20 positions shown · non-contrast
Comparison: None

CLINICAL DATA: Stage III chronic kidney disease, hypercalcemia

EXAM:
NM PARATHYROID SCINTIGRAPHY AND SPECT IMAGING
TECHNIQUE: Following intravenous administration of radiopharmaceutical, early
and 2-hour delayed planar images were obtained in the anterior
projection. Delayed triplanar SPECT images were also obtained at 2
hours.
RADIOPHARMACEUTICALS:  26 mCi 1c-FFm Sestamibi IV

[Series 1: spect - (id)_(id)_cor · 4.1mm · 4.14mm/px · 6 of 128 frames shown]
[frame 11/128]
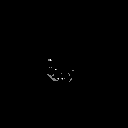
[frame 32/128]
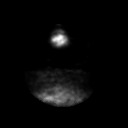
[frame 54/128]
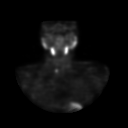
[frame 75/128]
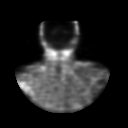
[frame 96/128]
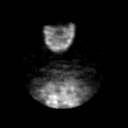
[frame 118/128]
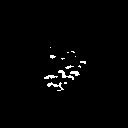

[Series 1: spect parathyroid · 4.14mm/px · 6 of 64 frames shown]
[frame 6/64  full-range]
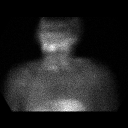
[frame 16/64  full-range]
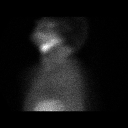
[frame 27/64  full-range]
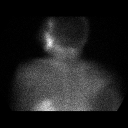
[frame 38/64  full-range]
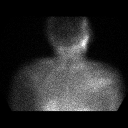
[frame 48/64  full-range]
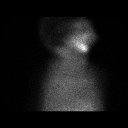
[frame 59/64  full-range]
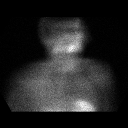

[Series 1: spect - (id)_(id) · 4.1mm · 4.14mm/px · 6 of 128 frames shown]
[frame 11/128]
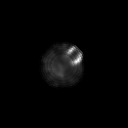
[frame 32/128]
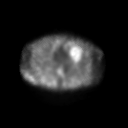
[frame 54/128]
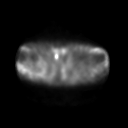
[frame 75/128]
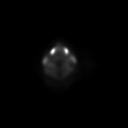
[frame 96/128]
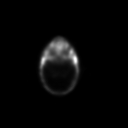
[frame 118/128]
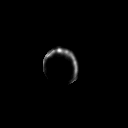

[Series 1: 15 min ant · 2.07mm/px · 1 of 1 slices shown]
[im 1/1]
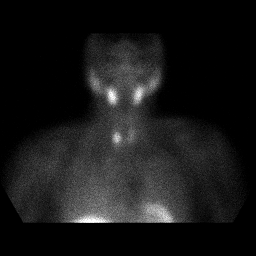

[Series 2: 2 hr ant · 2.07mm/px · 1 of 1 slices shown]
[im 1/1]
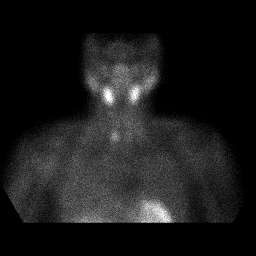

[20 of 20 positions shown; findings below may reference images not displayed]

FINDINGS: Planar imaging: Asymmetric initial sestamibi localization in the
inferior RIGHT thyroid lobe. Normal washout of tracer from thyroid
tissue with persistent visualization of a focus of increased tracer
localization at the expected position of the RIGHT inferior
parathyroid gland.

SPECT imaging: Abnormal sestamibi retention at the RIGHT inferior
parathyroid gland consistent with a parathyroid adenoma. No
additional abnormal retained sestamibi is seen at the positions of
the remaining parathyroid glands. No ectopic localization of
sestamibi within the mediastinum.
IMPRESSION: Abnormal parathyroid scintigraphy demonstrating abnormal sestamibi
retention at a parathyroid adenoma at the RIGHT inferior parathyroid
gland.

## 2018-08-24 DIAGNOSIS — M3130 Wegener's granulomatosis without renal involvement: Secondary | ICD-10-CM | POA: Diagnosis not present

## 2018-08-28 DIAGNOSIS — J449 Chronic obstructive pulmonary disease, unspecified: Secondary | ICD-10-CM | POA: Diagnosis not present

## 2018-08-28 DIAGNOSIS — F322 Major depressive disorder, single episode, severe without psychotic features: Secondary | ICD-10-CM | POA: Diagnosis not present

## 2018-08-28 DIAGNOSIS — I10 Essential (primary) hypertension: Secondary | ICD-10-CM | POA: Diagnosis not present

## 2018-08-28 DIAGNOSIS — E785 Hyperlipidemia, unspecified: Secondary | ICD-10-CM | POA: Diagnosis not present

## 2018-09-01 NOTE — Progress Notes (Signed)
Virtual Visit via Telephone Note   This visit type was conducted due to national recommendations for restrictions regarding the COVID-19 Pandemic (e.g. social distancing) in an effort to limit this patient's exposure and mitigate transmission in our community.  Due to his co-morbid illnesses, this patient is at least at moderate risk for complications without adequate follow up.  This format is felt to be most appropriate for this patient at this time.  The patient did not have access to video technology/had technical difficulties with video requiring transitioning to audio format only (telephone).  All issues noted in this document were discussed and addressed.  No physical exam could be performed with this format.  Please refer to the patient's chart for his  consent to telehealth for [facility]   Pt's phone would not do video , we did try Date:  09/02/2018   ID:  Brian Hudson, DOB 04-Apr-1951, MRN [id number]  Patient Location: Home Provider Location: Office  PCP:  Josetta Huddle, MD  Cardiologist:  Candee Furbish, MD  Electrophysiologist:  None   Evaluation Performed:  Follow-Up Visit  Chief Complaint:  CAD  History of Present Illness:     68 y.o. male with coronary artery disease status post obtuse marginal stent placement on 05/23/17 just beyond previously placed stent here for follow-up.  He is doing quite well.   During a prior workup underwent nuclear stress test at the request of [name], NP that demonstrated a markedly abnormal exercise treadmill. Marked ST segment depression, 2 mm diffuse. AVR elevation. His nuclear imaging was normal however. EF 53%. No perfusion defects. No transient ischemic dilatation.  Because of these findings, he ended up having a cardiac catheterization which did reveal significant CAD and resulted in DES to proximal left circumflex, and Cutting Balloon distal OM.  The most recent cardiopulmonary stress test was performed because of coronary  calcification seen on CT scan. [name] also had seen him and noted that he did not desaturate. He was concerned about possible chronotropic incompetence. His heart rate at baseline was in the 40s.  His fat her had heart attack at age 68. Coronary calcifications are noted in the LAD and circumflex arteries.  05/15/17 -He saw [name] who performed a cardiopulmonary function study and this demonstrated ST segment depression once again on the ECG portion with some difficulty getting his heart rate increased.  Could be medication related.  With his ongoing dyspnea symptoms, we have decided to pursue a cardiac catheterization.  06/06/17 -Underwent cardiac catheterization, new stent placed in obtuse marginal just beyond previous stent.  He states that he feels much better.  Decreased anginal symptoms.  No bleeding, no syncope.  The below for details.  06/09/2018 - Had an episode of about 15 to 20 minutes of central chest pain once again. Happened when laying down. ? MSK. Sometimes out in yard. NTG once and ?stopped in.   Resolved on its own.  Prior cardiac catheterization in 2019 resulted in circumflex stent. Trying Jardiance. Felt some nausea after coffee. Talking with Tammy at Dr. Inda Merlin.  He is going to try isosorbide.  Denies any shortness of breath, orthopnea PND syncope bleeding.  On last visit imdur was added.   Today pt is doing better.  No further chest pain and with adjustment of his lexapro to 10 mg from 20 mg he feels much better.  He has only had one episode of chest pain since imdur added.  His SOB is stable with his COPD  though he does have problems with increased SOB while wearing  Mask around others, mostly buying groceries.  Mostly he stays at home.  His BP ranges from 113/62 to 157/79 but mostly 130s. Systolic.  His glucose is stable though he has issues with planning meals and then being active and drops in glucose.     The patient does not have symptoms concerning for  COVID-19 infection (fever, chills, cough, or new shortness of breath).    Past Medical History:  Diagnosis Date   Arthritis    osteoarthritis    CAD (coronary artery disease)    a. 09/20/16: 45% OM, 99% ostial ramus s/p DES, 80% proximal LCx s/p DES, 80% more distal LCx on AV groove portion of the circumflex after OM1 s/p cutting balloon and PCTA.    Concussion    Depression    DM (diabetes mellitus) ([place])    type 2   Dyspnea    with exertion    Dysrhythmia    hx of atrial fib- 15-20 years ago    ED (erectile dysfunction)    Fracture 2010   L 3 and L 4 healed with brace   GERD (gastroesophageal reflux disease)    HTN (hypertension)    Hyperlipidemia    IBS (irritable bowel syndrome)    Palpitations    none in last few yrs   Peripheral vascular disease ([place])    legs    Pneumonia    hx of    Prostate cancer ([place]) 2007   Sigmoid diverticulosis    Wegener's granulomatosis ([place])    followed by dr Berna Bue   Past Surgical History:  Procedure Laterality Date   CARDIAC CATHETERIZATION     COLONOSCOPY N/A 06/24/2016   Procedure: COLONOSCOPY with propofol;  Surgeon: Garlan Fair, MD;  Location: [facility];  Service: Endoscopy;  Laterality: N/A;   colonscopy  2008   CORONARY STENT INTERVENTION N/A 09/20/2016   Procedure: Coronary Stent Intervention;  Surgeon: Leonie Man, MD;  Location: [facility];  Service: Cardiovascular;  Laterality: N/A;   CORONARY STENT INTERVENTION N/A 05/23/2017   Procedure: CORONARY STENT INTERVENTION;  Surgeon: Burnell Blanks, MD;  Location: [facility];  Service: Cardiovascular;  Laterality: N/A;   INSERTION PROSTATE RADIATION SEED  2007   INTRAVASCULAR PRESSURE WIRE/FFR STUDY N/A 09/20/2016   Procedure: Intravascular Pressure Wire/FFR Study;  Surgeon: Leonie Man, MD;  Location: [facility];  Service: Cardiovascular;  Laterality: N/A;   LEFT HEART CATH AND CORONARY ANGIOGRAPHY N/A  09/20/2016   Procedure: Left Heart Cath and Coronary Angiography;  Surgeon: Leonie Man, MD;  Location: [facility];  Service: Cardiovascular;  Laterality: N/A;   LEFT HEART CATH AND CORONARY ANGIOGRAPHY N/A 05/23/2017   Procedure: LEFT HEART CATH AND CORONARY ANGIOGRAPHY;  Surgeon: Burnell Blanks, MD;  Location: [facility];  Service: Cardiovascular;  Laterality: N/A;   PARATHYROIDECTOMY Right 02/26/2018   Procedure: RIGHT INFERIOR PARATHYROIDECTOMY;  Surgeon: Armandina Gemma, MD;  Location: [facility];  Service: General;  Laterality: Right;   ROTATOR CUFF REPAIR Right      Current Meds  Medication Sig   acetaminophen (TYLENOL) 325 MG tablet Take 650 mg by mouth every morning.    amLODipine (NORVASC) 5 MG tablet TAKE 1 TABLET(5 MG) BY MOUTH DAILY   aspirin EC 81 MG tablet Take 81 mg by mouth at bedtime.   buPROPion (ZYBAN) 150 MG 12 hr tablet Take 150 mg by  mouth every morning.   escitalopram (LEXAPRO) 10 MG tablet Take 10 mg by mouth daily.   glimepiride (AMARYL) 1 MG tablet Take 1 mg by mouth daily with breakfast.   ipratropium-albuterol (DUONEB) 0.5-2.5 (3) MG/3ML SOLN Take 3 mLs by nebulization every 6 (six) hours as needed (wheezing or shortness of breath).   isosorbide mononitrate (IMDUR) 30 MG 24 hr tablet Take 1 tablet (30 mg total) by mouth daily.   Multiple Vitamin (MULTIVITAMIN WITH MINERALS) TABS tablet Take 1 tablet by mouth daily.   nitroGLYCERIN (NITROSTAT) 0.4 MG SL tablet Place 1 tablet (0.4 mg total) under the tongue every 5 (five) minutes x 3 doses as needed for chest pain.   pantoprazole (PROTONIX) 40 MG tablet Take 1 tablet (40 mg total) by mouth 2 (two) times daily.   rosuvastatin (CRESTOR) 5 MG tablet Take 10 mg by mouth daily.    sitaGLIPtin (JANUVIA) 100 MG tablet Take 100 mg by mouth daily.    sodium chloride (OCEAN) 0.65 % SOLN nasal spray Place 1 spray into both nostrils daily as needed for congestion.    SYMBICORT 160-4.5  MCG/ACT inhaler INHALE 2 PUFFS INTO THE LUNGS TWICE DAILY   traMADol (ULTRAM) 50 MG tablet Take 1-2 tablets (50-100 mg total) by mouth every 6 (six) hours as needed.     Allergies:   Codeine; Lipitor [atorvastatin]; Zetia [ezetimibe]; and Zocor [simvastatin]   Social History   Tobacco Use   Smoking status: Former Smoker    Packs/day: 3.00    Years: 35.00    Pack years: 105.00    Types: Cigarettes    Last attempt to quit: 04/09/1991    Years since quitting: 27.4   Smokeless tobacco: Never Used  Substance Use Topics   Alcohol use: No    Alcohol/week: 0.0 standard drinks   Drug use: Never    Comment:   marijuana last used 2- weeks ago as of 06-20-16     Family Hx: The patient's family history includes CAD in his father; Cancer in his brother; Diabetic kidney disease in his mother; Healthy in his sister and sister; Heart attack (age of onset: 31) in his father.  ROS:   Please see the history of present illness.    General:no colds or fevers, no weight changes Skin:no rashes or ulcers HEENT:no blurred vision, no congestion CV:see HPI PUL:see HPI GI:no diarrhea constipation or melena, no indigestion GU:no hematuria, no dysuria MS:no joint pain, no claudication Neuro:no syncope, no lightheadedness Endo:+ diabetes, no thyroid disease  All other systems reviewed and are negative.   Prior CV studies:   The following studies were reviewed today:  Cardiac cath 05/23/17: 1. Non-obstructive diffuse disease in the LAD 2. Severe stenosis very small caliber intermediate branch. Unchanged from last cath and too small for PCI 3. Severe stenosis OM1 just beyond old stent. Successful PTCA/DES x 1 OM1.  4. Non-obstructive disease in the large dominant RCA 5. Normal LV systolic function.   Recommendations: Continue ASA, Plavix and statin. Same day discharge if stable.    Barium swallow: 04/17/17 Fluoroscopic evaluation of swallowing demonstrates disruption of 3 out of 4 primary  esophageal peristaltic waves. There is a small hiatal hernia. Mild mucosal ring in the distal esophagus. No reflux with the water siphon maneuver. The patient swallowed a 13 mm barium tablet which freely passed into the stomach.  IMPRESSION: Nonspecific esophageal motility disorder.  Small hiatal hernia.  Mild mucosal ring in the distal esophagus. The 13 mm barium tablet freely passes into  the stomach.  Cardiopulmonary exercise test: 05/12/17:  Conclusion: Exercise testing with gas exchange demonstrates low-normal functional capacity when compared to matched sedentary norms. There is no indication for ventilatory limitation. Several parameters indicate and suggest cardiovascular ischemia is a likely the primary factor in patient's intolerance. There was ST-depression in the absence of chest pain or dizziness at higher intensity exercise and at peak workload. There was a hypertensive response and chronotropic incompetence simultaneously during exercise. Study should be clinically correlated accordingly.    Labs/Other Tests and Data Reviewed:    EKG:  An ECG dated 12/11/17 was personally reviewed today and demonstrated:  SB at 49 and otherwise normal.  HR per pt now in 50s  Recent Labs: 02/20/2018: BUN 16; Creatinine, Ser 1.18; Hemoglobin 12.8; Platelets 187; Potassium 4.9; Sodium 138   Recent Lipid Panel Lab Results  Component Value Date/Time   CHOL 120 08/06/2017 09:12 AM   TRIG 110 08/06/2017 09:12 AM   HDL 33 (L) 08/06/2017 09:12 AM   CHOLHDL 3.6 08/06/2017 09:12 AM   LDLCALC 65 08/06/2017 09:12 AM    Wt Readings from Last 3 Encounters:  09/02/18 172 lb (78 kg)  06/09/18 166 lb (75.3 kg)  02/26/18 162 lb (73.5 kg)     Objective:    Vital Signs:  BP 136/60    Pulse 65    Ht 5\' 9"  (1.753 m)    Wt 172 lb (78 kg)    BMI 25.40 kg/m    VITAL SIGNS:  reviewed  General strong voice, NAD Neuro A&O X 3 follows commands Lungs:  No SOB with talking, can complete  sentences Psych: pleasant affect  ASSESSMENT & PLAN:    1. CAD with hx stent to LCX 2018 and 2019, angina on last visit improved with imdur.  He notes that once lexapro was decreased he felt better.  But imdur seems to have helped as well. Continue.  2. Coronary artery calcification of LAD and LCX, on crestor other statins with side effects.  3. HLD continue statin. Will need lipid, hepatic in next month 4. Edema with rash of lower ext improved with decrease in amlodipine. 5. Hx of Wegener's , pul. Doubts Wegener's , lung nodule followed by pulmonary 6. COPD followed by pulmonary.  COVID-19 Education: The signs and symptoms of COVID-19 were discussed with the patient and how to seek care for testing (follow up with PCP or arrange E-visit).  The importance of social distancing was discussed today.  Time:   Today, I have spent 10 minutes with the patient with telehealth technology discussing the above problems.     Medication Adjustments/Labs and Tests Ordered: Current medicines are reviewed at length with the patient today.  Concerns regarding medicines are outlined above.   Tests Ordered: No orders of the defined types were placed in this encounter.   Medication Changes: No orders of the defined types were placed in this encounter.   Disposition:  Follow up in 3 month(s)  Signed, [name], NP  09/02/2018 11:55 AM    [place]

## 2018-09-02 ENCOUNTER — Other Ambulatory Visit: Payer: Self-pay

## 2018-09-02 ENCOUNTER — Telehealth (INDEPENDENT_AMBULATORY_CARE_PROVIDER_SITE_OTHER): Payer: Medicare Other | Admitting: Cardiology

## 2018-09-02 ENCOUNTER — Encounter: Payer: Self-pay | Admitting: Cardiology

## 2018-09-02 ENCOUNTER — Telehealth: Payer: Self-pay | Admitting: Cardiology

## 2018-09-02 VITALS — BP 136/60 | HR 65 | Ht 69.0 in | Wt 172.0 lb

## 2018-09-02 DIAGNOSIS — I25119 Atherosclerotic heart disease of native coronary artery with unspecified angina pectoris: Secondary | ICD-10-CM | POA: Diagnosis not present

## 2018-09-02 DIAGNOSIS — E782 Mixed hyperlipidemia: Secondary | ICD-10-CM

## 2018-09-02 DIAGNOSIS — J449 Chronic obstructive pulmonary disease, unspecified: Secondary | ICD-10-CM

## 2018-09-02 DIAGNOSIS — E785 Hyperlipidemia, unspecified: Secondary | ICD-10-CM

## 2018-09-02 DIAGNOSIS — I2510 Atherosclerotic heart disease of native coronary artery without angina pectoris: Secondary | ICD-10-CM

## 2018-09-02 DIAGNOSIS — Z955 Presence of coronary angioplasty implant and graft: Secondary | ICD-10-CM

## 2018-09-02 DIAGNOSIS — Z862 Personal history of diseases of the blood and blood-forming organs and certain disorders involving the immune mechanism: Secondary | ICD-10-CM

## 2018-09-02 NOTE — Telephone Encounter (Signed)
New message:    patient returning a call back concering his appt today. I did not see a note. Pleas call patient.

## 2018-09-02 NOTE — Telephone Encounter (Signed)
Returned pts call and he has been prepared for his appt today.

## 2018-09-02 NOTE — Patient Instructions (Signed)
Medication Instructions:  Your physician recommends that you continue on your current medications as directed. Please refer to the Current Medication list given to you today.  If you need a refill on your cardiac medications before your next appointment, please call your pharmacy.   Lab work: 10/02/2018 8:45 AM:  COME TO THE OFFICE FOR FASTING LIPID AND HEPATIC.  NOTHING TO EAT OR DRINK AFTER MIDNIGHT THE NIGHT BEFORE,  If you have labs (blood work) drawn today and your tests are completely normal, you will receive your results only by: Marland Kitchen MyChart Message (if you have MyChart) OR . A paper copy in the mail If you have any lab test that is abnormal or we need to change your treatment, we will call you to review the results.  Testing/Procedures: None ordered   Follow-Up: At [facility], you and your health needs are our priority.  As part of our continuing mission to provide you with exceptional heart care, we have created designated Provider Care Teams.  These Care Teams include your primary Cardiologist (physician) and Advanced Practice Providers (APPs -  Physician Assistants and Nurse Practitioners) who all work together to provide you with the care you need, when you need it. You will need a follow up appointment in 3 months.  Please call our office 2 months in advance to schedule this appointment.  You may see Candee Furbish, MD or one of the following Advanced Practice Providers on your designated Care Team:   Truitt Merle, NP [name], NP . Kathyrn Drown, NP  Any Other Special Instructions Will Be Listed Below (If Applicable).

## 2018-09-30 DIAGNOSIS — I2510 Atherosclerotic heart disease of native coronary artery without angina pectoris: Secondary | ICD-10-CM | POA: Diagnosis not present

## 2018-09-30 DIAGNOSIS — E1165 Type 2 diabetes mellitus with hyperglycemia: Secondary | ICD-10-CM | POA: Diagnosis not present

## 2018-09-30 DIAGNOSIS — F322 Major depressive disorder, single episode, severe without psychotic features: Secondary | ICD-10-CM | POA: Diagnosis not present

## 2018-10-02 ENCOUNTER — Other Ambulatory Visit: Payer: Medicare Other | Admitting: *Deleted

## 2018-10-02 ENCOUNTER — Other Ambulatory Visit: Payer: Self-pay

## 2018-10-02 DIAGNOSIS — I2510 Atherosclerotic heart disease of native coronary artery without angina pectoris: Secondary | ICD-10-CM | POA: Diagnosis not present

## 2018-10-02 DIAGNOSIS — E782 Mixed hyperlipidemia: Secondary | ICD-10-CM | POA: Diagnosis not present

## 2018-10-02 LAB — LIPID PANEL
Chol/HDL Ratio: 3.5 ratio (ref 0.0–5.0)
Cholesterol, Total: 122 mg/dL (ref 100–199)
HDL: 35 mg/dL — ABNORMAL LOW (ref >39)
LDL Calculated: 69 mg/dL (ref 0–99)
Triglycerides: 92 mg/dL (ref 0–149)
VLDL Cholesterol Cal: 18 mg/dL (ref 5–40)

## 2018-10-02 LAB — HEPATIC FUNCTION PANEL
ALT: 23 IU/L (ref 0–44)
AST: 30 IU/L (ref 0–40)
Albumin: 4.2 g/dL (ref 3.8–4.8)
Alkaline Phosphatase: 80 IU/L (ref 39–117)
Bilirubin Total: 0.3 mg/dL (ref 0.0–1.2)
Bilirubin, Direct: 0.09 mg/dL (ref 0.00–0.40)
Total Protein: 7.3 g/dL (ref 6.0–8.5)

## 2018-10-05 DIAGNOSIS — F322 Major depressive disorder, single episode, severe without psychotic features: Secondary | ICD-10-CM | POA: Diagnosis not present

## 2018-10-05 DIAGNOSIS — J449 Chronic obstructive pulmonary disease, unspecified: Secondary | ICD-10-CM | POA: Diagnosis not present

## 2018-10-05 DIAGNOSIS — E785 Hyperlipidemia, unspecified: Secondary | ICD-10-CM | POA: Diagnosis not present

## 2018-10-05 DIAGNOSIS — I10 Essential (primary) hypertension: Secondary | ICD-10-CM | POA: Diagnosis not present

## 2018-10-05 DIAGNOSIS — N183 Chronic kidney disease, stage 3 (moderate): Secondary | ICD-10-CM | POA: Diagnosis not present

## 2018-10-14 DIAGNOSIS — N2581 Secondary hyperparathyroidism of renal origin: Secondary | ICD-10-CM | POA: Diagnosis not present

## 2018-10-14 DIAGNOSIS — N183 Chronic kidney disease, stage 3 (moderate): Secondary | ICD-10-CM | POA: Diagnosis not present

## 2018-10-14 DIAGNOSIS — I129 Hypertensive chronic kidney disease with stage 1 through stage 4 chronic kidney disease, or unspecified chronic kidney disease: Secondary | ICD-10-CM | POA: Diagnosis not present

## 2018-10-14 DIAGNOSIS — D631 Anemia in chronic kidney disease: Secondary | ICD-10-CM | POA: Diagnosis not present

## 2018-10-22 DIAGNOSIS — M545 Low back pain: Secondary | ICD-10-CM | POA: Diagnosis not present

## 2018-10-22 DIAGNOSIS — M3130 Wegener's granulomatosis without renal involvement: Secondary | ICD-10-CM | POA: Diagnosis not present

## 2018-10-22 DIAGNOSIS — R0602 Shortness of breath: Secondary | ICD-10-CM | POA: Diagnosis not present

## 2018-10-22 DIAGNOSIS — M150 Primary generalized (osteo)arthritis: Secondary | ICD-10-CM | POA: Diagnosis not present

## 2018-11-05 DIAGNOSIS — I10 Essential (primary) hypertension: Secondary | ICD-10-CM | POA: Diagnosis not present

## 2018-11-05 DIAGNOSIS — F322 Major depressive disorder, single episode, severe without psychotic features: Secondary | ICD-10-CM | POA: Diagnosis not present

## 2018-11-05 DIAGNOSIS — E785 Hyperlipidemia, unspecified: Secondary | ICD-10-CM | POA: Diagnosis not present

## 2018-11-05 DIAGNOSIS — J449 Chronic obstructive pulmonary disease, unspecified: Secondary | ICD-10-CM | POA: Diagnosis not present

## 2018-11-23 DIAGNOSIS — F322 Major depressive disorder, single episode, severe without psychotic features: Secondary | ICD-10-CM | POA: Diagnosis not present

## 2018-11-23 DIAGNOSIS — J449 Chronic obstructive pulmonary disease, unspecified: Secondary | ICD-10-CM | POA: Diagnosis not present

## 2018-11-23 DIAGNOSIS — E785 Hyperlipidemia, unspecified: Secondary | ICD-10-CM | POA: Diagnosis not present

## 2018-11-23 DIAGNOSIS — I10 Essential (primary) hypertension: Secondary | ICD-10-CM | POA: Diagnosis not present

## 2018-12-04 ENCOUNTER — Other Ambulatory Visit: Payer: Self-pay

## 2018-12-04 DIAGNOSIS — Z20822 Contact with and (suspected) exposure to covid-19: Secondary | ICD-10-CM

## 2018-12-04 DIAGNOSIS — R6889 Other general symptoms and signs: Secondary | ICD-10-CM | POA: Diagnosis not present

## 2018-12-05 LAB — NOVEL CORONAVIRUS, NAA: SARS-CoV-2, NAA: NOT DETECTED

## 2018-12-08 DIAGNOSIS — L570 Actinic keratosis: Secondary | ICD-10-CM | POA: Diagnosis not present

## 2018-12-08 DIAGNOSIS — D485 Neoplasm of uncertain behavior of skin: Secondary | ICD-10-CM | POA: Diagnosis not present

## 2018-12-08 DIAGNOSIS — L821 Other seborrheic keratosis: Secondary | ICD-10-CM | POA: Diagnosis not present

## 2018-12-08 DIAGNOSIS — D225 Melanocytic nevi of trunk: Secondary | ICD-10-CM | POA: Diagnosis not present

## 2018-12-08 DIAGNOSIS — L814 Other melanin hyperpigmentation: Secondary | ICD-10-CM | POA: Diagnosis not present

## 2018-12-09 ENCOUNTER — Other Ambulatory Visit: Payer: Self-pay

## 2018-12-09 ENCOUNTER — Ambulatory Visit (INDEPENDENT_AMBULATORY_CARE_PROVIDER_SITE_OTHER): Payer: Medicare Other | Admitting: Cardiology

## 2018-12-09 ENCOUNTER — Encounter: Payer: Self-pay | Admitting: Cardiology

## 2018-12-09 VITALS — BP 126/60 | HR 59 | Ht 69.0 in | Wt 177.0 lb

## 2018-12-09 DIAGNOSIS — I25119 Atherosclerotic heart disease of native coronary artery with unspecified angina pectoris: Secondary | ICD-10-CM

## 2018-12-09 DIAGNOSIS — E782 Mixed hyperlipidemia: Secondary | ICD-10-CM

## 2018-12-09 DIAGNOSIS — Z9861 Coronary angioplasty status: Secondary | ICD-10-CM

## 2018-12-09 DIAGNOSIS — E119 Type 2 diabetes mellitus without complications: Secondary | ICD-10-CM | POA: Diagnosis not present

## 2018-12-09 DIAGNOSIS — I209 Angina pectoris, unspecified: Secondary | ICD-10-CM

## 2018-12-09 DIAGNOSIS — I10 Essential (primary) hypertension: Secondary | ICD-10-CM

## 2018-12-09 NOTE — Progress Notes (Signed)
Cardiology Office Note:    Date:  12/09/2018   ID:  Brian Hudson, DOB 12/03/1950, MRN [id number]  PCP:  Josetta Huddle, MD  Cardiologist:  Candee Furbish, MD    Referring MD: Josetta Huddle, MD     History of Present Illness:    Brian Hudson is a 68 y.o. male with coronary artery disease status post recent obtuse marginal stent placement on 05/23/17 just beyond previously placed stent here for follow-up.  He is doing quite well.   During a prior workup underwent nuclear stress test at the request of [name], NP that demonstrated a markedly abnormal exercise treadmill. Marked ST segment depression, 2 mm diffuse. AVR elevation. His nuclear imaging was normal however. EF 53%. No perfusion defects. No transient ischemic dilatation.  Because of these findings, he ended up having a cardiac catheterization which did reveal significant CAD and resulted in DES to proximal left circumflex, and Cutting Balloon distal OM.  The most recent cardiopulmonary stress test was performed because of coronary calcification seen on CT scan. [name] also had seen him and noted that he did not desaturate. He was concerned about possible chronotropic incompetence. His heart rate at baseline was in the 40s.  His father had heart attack at age 70. Coronary calcifications are noted in the LAD and circumflex arteries.  05/15/17 -He saw [name] who performed a cardiopulmonary function study and this demonstrated ST segment depression once again on the ECG portion with some difficulty getting his heart rate increased.  Could be medication related.  With his ongoing dyspnea symptoms, we have decided to pursue a cardiac catheterization.  06/06/17 -Underwent cardiac catheterization, new stent placed in obtuse marginal just beyond previous stent.  He states that he feels much better.  Decreased anginal symptoms.  No bleeding, no syncope.  The below for details.  06/09/2018 - Had an episode of about 15 to 20 minutes of  central chest pain once again. Happened when laying down. ? MSK. Sometimes out in yard. NTG once and ?stopped in.   Resolved on its own.  Prior cardiac catheterization in 2019 resulted in circumflex stent. Trying Jardiance. Felt some nausea after coffee. Talking with Tammy at Dr. Inda Merlin.  He is going to try isosorbide.  Denies any shortness of breath, orthopnea PND syncope bleeding.  12/09/2018- here for follow-up of coronary artery disease.  Back in 2019 had cardiac catheterization and new stent in the obtuse marginal branch just beyond his previous stents.  Felt better.  He had a biopsy of a skin lesion on his right forehead.  His brother unfortunately had spread of cancer and had to have a significant amount of his skin on his face removed. No fevers chills nausea vomiting syncope  Past Medical History:  Diagnosis Date  . Arthritis    osteoarthritis   . CAD (coronary artery disease)    a. 09/20/16: 45% OM, 99% ostial ramus s/p DES, 80% proximal LCx s/p DES, 80% more distal LCx on AV groove portion of the circumflex after OM1 s/p cutting balloon and PCTA.   Marland Kitchen Concussion   . Depression   . DM (diabetes mellitus) ([place])    type 2  . Dyspnea    with exertion   . Dysrhythmia    hx of atrial fib- 15-20 years ago   . ED (erectile dysfunction)   . Fracture 2010   L 3 and L 4 healed with brace  . GERD (gastroesophageal reflux disease)   . HTN (hypertension)   .  Hyperlipidemia   . IBS (irritable bowel syndrome)   . Palpitations    none in last few yrs  . Peripheral vascular disease (HCC)    legs   . Pneumonia    hx of   . Prostate cancer ([place]) 2007  . Sigmoid diverticulosis   . Wegener's granulomatosis ([place])    followed by dr Berna Bue    Past Surgical History:  Procedure Laterality Date  . CARDIAC CATHETERIZATION    . COLONOSCOPY N/A 06/24/2016   Procedure: COLONOSCOPY with propofol;  Surgeon: Garlan Fair, MD;  Location: [facility];  Service: Endoscopy;  Laterality: N/A;  .  colonscopy  2008  . CORONARY STENT INTERVENTION N/A 09/20/2016   Procedure: Coronary Stent Intervention;  Surgeon: Leonie Man, MD;  Location: [facility];  Service: Cardiovascular;  Laterality: N/A;  . CORONARY STENT INTERVENTION N/A 05/23/2017   Procedure: CORONARY STENT INTERVENTION;  Surgeon: Burnell Blanks, MD;  Location: [facility];  Service: Cardiovascular;  Laterality: N/A;  . INSERTION PROSTATE RADIATION SEED  2007  . INTRAVASCULAR PRESSURE WIRE/FFR STUDY N/A 09/20/2016   Procedure: Intravascular Pressure Wire/FFR Study;  Surgeon: Leonie Man, MD;  Location: [facility];  Service: Cardiovascular;  Laterality: N/A;  . LEFT HEART CATH AND CORONARY ANGIOGRAPHY N/A 09/20/2016   Procedure: Left Heart Cath and Coronary Angiography;  Surgeon: Leonie Man, MD;  Location: [facility];  Service: Cardiovascular;  Laterality: N/A;  . LEFT HEART CATH AND CORONARY ANGIOGRAPHY N/A 05/23/2017   Procedure: LEFT HEART CATH AND CORONARY ANGIOGRAPHY;  Surgeon: Burnell Blanks, MD;  Location: [facility];  Service: Cardiovascular;  Laterality: N/A;  . PARATHYROIDECTOMY Right 02/26/2018   Procedure: RIGHT INFERIOR PARATHYROIDECTOMY;  Surgeon: Armandina Gemma, MD;  Location: [facility];  Service: General;  Laterality: Right;  . ROTATOR CUFF REPAIR Right     Current Medications: Current Meds  Medication Sig  . acetaminophen (TYLENOL) 325 MG tablet Take 650 mg by mouth every morning.   Marland Kitchen amLODipine (NORVASC) 5 MG tablet TAKE 1 TABLET(5 MG) BY MOUTH DAILY  . aspirin EC 81 MG tablet Take 81 mg by mouth at bedtime.  Marland Kitchen buPROPion (ZYBAN) 150 MG 12 hr tablet Take 150 mg by mouth every morning.  . clopidogrel (PLAVIX) 75 MG tablet Take 75 mg by mouth daily.  Marland Kitchen escitalopram (LEXAPRO) 10 MG tablet Take 10 mg by mouth daily.  Marland Kitchen glimepiride (AMARYL) 1 MG tablet Take 1 mg by mouth daily with breakfast.  . ipratropium-albuterol (DUONEB) 0.5-2.5 (3) MG/3ML SOLN  Take 3 mLs by nebulization every 6 (six) hours as needed (wheezing or shortness of breath).  . isosorbide mononitrate (IMDUR) 30 MG 24 hr tablet Take 1 tablet (30 mg total) by mouth daily.  . Multiple Vitamin (MULTIVITAMIN WITH MINERALS) TABS tablet Take 1 tablet by mouth daily.  . nitroGLYCERIN (NITROSTAT) 0.4 MG SL tablet Place 1 tablet (0.4 mg total) under the tongue every 5 (five) minutes x 3 doses as needed for chest pain.  . pantoprazole (PROTONIX) 40 MG tablet Take 1 tablet (40 mg total) by mouth 2 (two) times daily.  . rosuvastatin (CRESTOR) 5 MG tablet Take 10 mg by mouth daily.   . sitaGLIPtin (JANUVIA) 100 MG tablet Take 100 mg by mouth daily.   . sodium chloride (OCEAN) 0.65 % SOLN nasal spray Place 1 spray into both nostrils daily as needed for congestion.   . SYMBICORT 160-4.5 MCG/ACT inhaler INHALE 2 PUFFS INTO  THE LUNGS TWICE DAILY  . traMADol (ULTRAM) 50 MG tablet Take 1-2 tablets (50-100 mg total) by mouth every 6 (six) hours as needed.     Allergies:   Codeine, Lipitor [atorvastatin], Zetia [ezetimibe], and Zocor [simvastatin]   Social History   Socioeconomic History  . Marital status: Divorced    Spouse name: Not on file  . Number of children: Not on file  . Years of education: Not on file  . Highest education level: Not on file  Occupational History  . Occupation: Museum/gallery curator  . Financial resource strain: Not on file  . Food insecurity    Worry: Not on file    Inability: Not on file  . Transportation needs    Medical: Not on file    Non-medical: Not on file  Tobacco Use  . Smoking status: Former Smoker    Packs/day: 3.00    Years: 35.00    Pack years: 105.00    Types: Cigarettes    Quit date: 04/09/1991    Years since quitting: 27.6  . Smokeless tobacco: Never Used  Substance and Sexual Activity  . Alcohol use: No    Alcohol/week: 0.0 standard drinks  . Drug use: Never    Comment:   marijuana last used 2- weeks ago as of 06-20-16  . Sexual  activity: Not on file  Lifestyle  . Physical activity    Days per week: 0 days    Minutes per session: 0 min  . Stress: To some extent  Relationships  . Social Herbalist on phone: Not on file    Gets together: Not on file    Attends religious service: Not on file    Active member of club or organization: Not on file    Attends meetings of clubs or organizations: Not on file    Relationship status: Not on file  Other Topics Concern  . Not on file  Social History Narrative  . Not on file     Family History: The patient's family history includes CAD in his father; Cancer in his brother; Diabetic kidney disease in his mother; Healthy in his sister and sister; Heart attack (age of onset: 93) in his father. ROS:   Please see the history of present illness.    As above  EKGs/Labs/Other Studies Reviewed:    The following studies were reviewed today: Nuclear stress test as described above. Markedly abnormal ETT with ST segment depression. Perfusion images are normal.  Cath 0000000:  LV end diastolic pressure is normal.  1st Mrg lesion, 45 %stenosed.  Ost Ramus lesion, 99 %stenosed. Vessel is less than 1.5 mm  Culprit lesion: Bifurcation Cx-OM1 (1,1,1) lesion  A STENT SYNERGY DES 2.25X12 drug eluting stent was successfully placed.  Prox Cx-1 lesion, 80 %stenosed involving Ost 1st Mrg lesion, 80 %stenosed. ([place] 1,1,1)  A STENT SYNERGY DES 2.25X12 drug eluting stent was successfully placed crossing from the main Cx into OM1. Postdilated to 2.5 mm  Post intervention, there is a 0% residual stenosis.  Prox Cx-2 lesion, 80 %stenosed in the follow on AV groove portion of the circumflex after OM1. The bifurcation lesion.  Post Cutting Balloon then post PCI of OM PTCA intervention, there is a 10% residual stenosis.   Successful bifurcation PCI/PTCA of the circumflex into OM1 and into the AV groove circumflex for PTCA. OM1 is a larger vessel than the follow on AV  groove circumflex proximal therefore decided to stent into the OM and  PTCA the ostium.  Mild to moderate disease elsewhere. Normal LVEDP.  Plan:  Patient will need to be on aspirin plus Plavix for at least 6 months. I used a Synergy stent in order to allow for potentially safe stopping of the Plavix if necessary for Wegener's-related bleeding.  Patient was transferred back to the short stay holding area for TR band removal. If stable after bedrest, he would be ready for discharge today as per same day discharge planning.  Defer further management to Dr. Marlou Porch.  The patient is intolerant of statins, and is bradycardic at baseline precluding beta blocker use.    Glenetta Hew, M.D., M.S.  Cardiac cath 05/23/17: 1. Non-obstructive diffuse disease in the LAD 2. Severe stenosis very small caliber intermediate branch. Unchanged from last cath and too small for PCI 3. Severe stenosis OM1 just beyond old stent. Successful PTCA/DES x 1 OM1.  4. Non-obstructive disease in the large dominant RCA 5. Normal LV systolic function.   Recommendations: Continue ASA, Plavix and statin. Same day discharge if stable.    Barium swallow: 04/17/17 Fluoroscopic evaluation of swallowing demonstrates disruption of 3 out of 4 primary esophageal peristaltic waves. There is a small hiatal hernia. Mild mucosal ring in the distal esophagus. No reflux with the water siphon maneuver. The patient swallowed a 13 mm barium tablet which freely passed into the stomach.  IMPRESSION: Nonspecific esophageal motility disorder.  Small hiatal hernia.  Mild mucosal ring in the distal esophagus. The 13 mm barium tablet freely passes into the stomach.  Cardiopulmonary exercise test: 05/12/17:  Conclusion: Exercise testing with gas exchange demonstrates low-normal functional capacity when compared to matched sedentary norms. There is no indication for ventilatory limitation. Several parameters indicate and suggest  cardiovascular ischemia is a likely the primary factor in patient's intolerance. There was ST-depression in the absence of chest pain or dizziness at higher intensity exercise and at peak workload. There was a hypertensive response and chronotropic incompetence simultaneously during exercise. Study should be clinically correlated accordingly.   EKG: 12/09/2018-sinus bradycardia 59 with no other abnormalities.  Baseline ECG from exercise treadmill test shows sinus rhythm heart rate 49. Personally viewed.  Recent Labs: 02/20/2018: BUN 16; Creatinine, Ser 1.18; Hemoglobin 12.8; Platelets 187; Potassium 4.9; Sodium 138 10/02/2018: ALT 23   Recent Lipid Panel    Component Value Date/Time   CHOL 122 10/02/2018 0845   TRIG 92 10/02/2018 0845   HDL 35 (L) 10/02/2018 0845   CHOLHDL 3.5 10/02/2018 0845   LDLCALC 69 10/02/2018 0845    Physical Exam:    VS:  BP 126/60   Pulse (!) 59   Ht 5\' 9"  (1.753 m)   Wt 177 lb (80.3 kg)   BMI 26.14 kg/m     Wt Readings from Last 3 Encounters:  12/09/18 177 lb (80.3 kg)  09/02/18 172 lb (78 kg)  06/09/18 166 lb (75.3 kg)    GEN: Well nourished, well developed, in no acute distress  HEENT: normal, right temple dermatology biopsy scar Neck: no JVD, carotid bruits, or masses Cardiac: RRR; no murmurs, rubs, or gallops,no edema  Respiratory:  clear to auscultation bilaterally, normal work of breathing GI: soft, nontender, nondistended, + BS MS: no deformity or atrophy  Skin: warm and dry, no rash Neuro:  Alert and Oriented x 3, Strength and sensation are intact Psych: euthymic mood, full affect    ASSESSMENT:    1. Coronary artery disease involving native heart with angina pectoris, unspecified vessel or lesion type ([place])  2. Mixed hyperlipidemia   3. Diabetes mellitus with coincident hypertension ([place])   4. S/P PTCA (percutaneous transluminal coronary angioplasty)   5. Coronary artery disease with angina pectoris, unspecified vessel or lesion  type, unspecified whether native or transplanted heart ([place])   6. Angina, class III ([place])    PLAN:    In order of problems listed above:  Coronary artery disease status post circumflex stent June 2018 and 2019 with angina -   Angina has been stabilized with isosorbide.  Seems to be doing quite well. -Continue with aggressive secondary risk factor prevention.  Diabetes with coronary artery disease  -Aggressive prevention.  Goal hemoglobin A1c less than 7.  Last check 8.4.  Continue to work with Dr. Inda Merlin.  History of Wegener's - [name] doubts that his sinus issue was due to [facility].  Do not know why he has a chronic sinus issue however.  He also has a nodule of left lung which is not think his Wegener's.  Following with CT.    COPD -Shortness of breath with activity.  Continue to exercise. 1 year follow-up  Medication Adjustments/Labs and Tests Ordered: Current medicines are reviewed at length with the patient today.  Concerns regarding medicines are outlined above. Labs and tests ordered and medication changes are outlined in the patient instructions below:  Patient Instructions  Medication Instructions:   Your physician recommends that you continue on your current medications as directed. Please refer to the Current Medication list given to you today.  If you need a refill on your cardiac medications before your next appointment, please call your pharmacy.     Follow-Up: At [facility], you and your health needs are our priority.  As part of our continuing mission to provide you with exceptional heart care, we have created designated Provider Care Teams.  These Care Teams include your primary Cardiologist (physician) and Advanced Practice Providers (APPs -  Physician Assistants and Nurse Practitioners) who all work together to provide you with the care you need, when you need it. You will need a follow up appointment in 12 months with Dr. Marlou Porch.  Please call our office  2 months in advance to schedule this appointment.  You may see Candee Furbish, MD or one of the following Advanced Practice Providers on your designated Care Team:   Truitt Merle, NP [name], NP . Kathyrn Drown, NP        Signed, Candee Furbish, MD  12/09/2018 4:14 PM    [place]

## 2018-12-09 NOTE — Patient Instructions (Addendum)
Medication Instructions:   Your physician recommends that you continue on your current medications as directed. Please refer to the Current Medication list given to you today.  If you need a refill on your cardiac medications before your next appointment, please call your pharmacy.     Follow-Up: At [facility], you and your health needs are our priority.  As part of our continuing mission to provide you with exceptional heart care, we have created designated Provider Care Teams.  These Care Teams include your primary Cardiologist (physician) and Advanced Practice Providers (APPs -  Physician Assistants and Nurse Practitioners) who all work together to provide you with the care you need, when you need it. You will need a follow up appointment in 12 months with Dr. Marlou Porch.  Please call our office 2 months in advance to schedule this appointment.  You may see Candee Furbish, MD or one of the following Advanced Practice Providers on your designated Care Team:   Truitt Merle, NP [name], NP . Kathyrn Drown, NP

## 2018-12-22 DIAGNOSIS — E1165 Type 2 diabetes mellitus with hyperglycemia: Secondary | ICD-10-CM | POA: Diagnosis not present

## 2018-12-23 DIAGNOSIS — L570 Actinic keratosis: Secondary | ICD-10-CM | POA: Diagnosis not present

## 2019-01-20 DIAGNOSIS — Z23 Encounter for immunization: Secondary | ICD-10-CM | POA: Diagnosis not present

## 2019-02-02 DIAGNOSIS — E785 Hyperlipidemia, unspecified: Secondary | ICD-10-CM | POA: Diagnosis not present

## 2019-02-02 DIAGNOSIS — F322 Major depressive disorder, single episode, severe without psychotic features: Secondary | ICD-10-CM | POA: Diagnosis not present

## 2019-02-02 DIAGNOSIS — J449 Chronic obstructive pulmonary disease, unspecified: Secondary | ICD-10-CM | POA: Diagnosis not present

## 2019-02-02 DIAGNOSIS — I10 Essential (primary) hypertension: Secondary | ICD-10-CM | POA: Diagnosis not present

## 2019-02-05 DIAGNOSIS — F322 Major depressive disorder, single episode, severe without psychotic features: Secondary | ICD-10-CM | POA: Diagnosis not present

## 2019-02-05 DIAGNOSIS — I10 Essential (primary) hypertension: Secondary | ICD-10-CM | POA: Diagnosis not present

## 2019-02-05 DIAGNOSIS — E785 Hyperlipidemia, unspecified: Secondary | ICD-10-CM | POA: Diagnosis not present

## 2019-02-05 DIAGNOSIS — Z1389 Encounter for screening for other disorder: Secondary | ICD-10-CM | POA: Diagnosis not present

## 2019-02-05 DIAGNOSIS — J449 Chronic obstructive pulmonary disease, unspecified: Secondary | ICD-10-CM | POA: Diagnosis not present

## 2019-02-05 DIAGNOSIS — Z0000 Encounter for general adult medical examination without abnormal findings: Secondary | ICD-10-CM | POA: Diagnosis not present

## 2019-02-18 DIAGNOSIS — M25511 Pain in right shoulder: Secondary | ICD-10-CM | POA: Diagnosis not present

## 2019-03-08 DIAGNOSIS — E785 Hyperlipidemia, unspecified: Secondary | ICD-10-CM | POA: Diagnosis not present

## 2019-03-08 DIAGNOSIS — I10 Essential (primary) hypertension: Secondary | ICD-10-CM | POA: Diagnosis not present

## 2019-03-08 DIAGNOSIS — F322 Major depressive disorder, single episode, severe without psychotic features: Secondary | ICD-10-CM | POA: Diagnosis not present

## 2019-03-08 DIAGNOSIS — J449 Chronic obstructive pulmonary disease, unspecified: Secondary | ICD-10-CM | POA: Diagnosis not present

## 2019-03-12 ENCOUNTER — Other Ambulatory Visit: Payer: Self-pay | Admitting: Cardiology

## 2019-03-16 DIAGNOSIS — F322 Major depressive disorder, single episode, severe without psychotic features: Secondary | ICD-10-CM | POA: Diagnosis not present

## 2019-03-16 DIAGNOSIS — E1165 Type 2 diabetes mellitus with hyperglycemia: Secondary | ICD-10-CM | POA: Diagnosis not present

## 2019-03-16 DIAGNOSIS — J449 Chronic obstructive pulmonary disease, unspecified: Secondary | ICD-10-CM | POA: Diagnosis not present

## 2019-03-16 DIAGNOSIS — I10 Essential (primary) hypertension: Secondary | ICD-10-CM | POA: Diagnosis not present

## 2019-03-16 DIAGNOSIS — E785 Hyperlipidemia, unspecified: Secondary | ICD-10-CM | POA: Diagnosis not present

## 2019-04-08 DIAGNOSIS — Z7984 Long term (current) use of oral hypoglycemic drugs: Secondary | ICD-10-CM | POA: Diagnosis not present

## 2019-04-08 DIAGNOSIS — E1165 Type 2 diabetes mellitus with hyperglycemia: Secondary | ICD-10-CM | POA: Diagnosis not present

## 2019-04-10 DIAGNOSIS — Z20822 Contact with and (suspected) exposure to covid-19: Secondary | ICD-10-CM | POA: Diagnosis not present

## 2019-04-12 DIAGNOSIS — Z20822 Contact with and (suspected) exposure to covid-19: Secondary | ICD-10-CM | POA: Diagnosis not present

## 2019-05-07 DIAGNOSIS — E1165 Type 2 diabetes mellitus with hyperglycemia: Secondary | ICD-10-CM | POA: Diagnosis not present

## 2019-05-08 DIAGNOSIS — E785 Hyperlipidemia, unspecified: Secondary | ICD-10-CM | POA: Diagnosis not present

## 2019-05-08 DIAGNOSIS — J449 Chronic obstructive pulmonary disease, unspecified: Secondary | ICD-10-CM | POA: Diagnosis not present

## 2019-05-08 DIAGNOSIS — I10 Essential (primary) hypertension: Secondary | ICD-10-CM | POA: Diagnosis not present

## 2019-05-08 DIAGNOSIS — F322 Major depressive disorder, single episode, severe without psychotic features: Secondary | ICD-10-CM | POA: Diagnosis not present

## 2019-05-11 DIAGNOSIS — F322 Major depressive disorder, single episode, severe without psychotic features: Secondary | ICD-10-CM | POA: Diagnosis not present

## 2019-05-11 DIAGNOSIS — E785 Hyperlipidemia, unspecified: Secondary | ICD-10-CM | POA: Diagnosis not present

## 2019-05-11 DIAGNOSIS — J449 Chronic obstructive pulmonary disease, unspecified: Secondary | ICD-10-CM | POA: Diagnosis not present

## 2019-05-11 DIAGNOSIS — I10 Essential (primary) hypertension: Secondary | ICD-10-CM | POA: Diagnosis not present

## 2019-06-07 DIAGNOSIS — N183 Chronic kidney disease, stage 3 unspecified: Secondary | ICD-10-CM | POA: Diagnosis not present

## 2019-06-16 DIAGNOSIS — E785 Hyperlipidemia, unspecified: Secondary | ICD-10-CM | POA: Diagnosis not present

## 2019-06-16 DIAGNOSIS — J449 Chronic obstructive pulmonary disease, unspecified: Secondary | ICD-10-CM | POA: Diagnosis not present

## 2019-06-16 DIAGNOSIS — I10 Essential (primary) hypertension: Secondary | ICD-10-CM | POA: Diagnosis not present

## 2019-06-16 DIAGNOSIS — F322 Major depressive disorder, single episode, severe without psychotic features: Secondary | ICD-10-CM | POA: Diagnosis not present

## 2019-06-17 DIAGNOSIS — I129 Hypertensive chronic kidney disease with stage 1 through stage 4 chronic kidney disease, or unspecified chronic kidney disease: Secondary | ICD-10-CM | POA: Diagnosis not present

## 2019-06-17 DIAGNOSIS — D631 Anemia in chronic kidney disease: Secondary | ICD-10-CM | POA: Diagnosis not present

## 2019-06-17 DIAGNOSIS — N2581 Secondary hyperparathyroidism of renal origin: Secondary | ICD-10-CM | POA: Diagnosis not present

## 2019-06-17 DIAGNOSIS — N183 Chronic kidney disease, stage 3 unspecified: Secondary | ICD-10-CM | POA: Diagnosis not present

## 2019-07-07 DIAGNOSIS — E1165 Type 2 diabetes mellitus with hyperglycemia: Secondary | ICD-10-CM | POA: Diagnosis not present

## 2019-08-04 DIAGNOSIS — E785 Hyperlipidemia, unspecified: Secondary | ICD-10-CM | POA: Diagnosis not present

## 2019-08-04 DIAGNOSIS — E1165 Type 2 diabetes mellitus with hyperglycemia: Secondary | ICD-10-CM | POA: Diagnosis not present

## 2019-08-04 DIAGNOSIS — J449 Chronic obstructive pulmonary disease, unspecified: Secondary | ICD-10-CM | POA: Diagnosis not present

## 2019-08-04 DIAGNOSIS — I10 Essential (primary) hypertension: Secondary | ICD-10-CM | POA: Diagnosis not present

## 2019-08-06 DIAGNOSIS — E1165 Type 2 diabetes mellitus with hyperglycemia: Secondary | ICD-10-CM | POA: Diagnosis not present

## 2019-09-03 DIAGNOSIS — E785 Hyperlipidemia, unspecified: Secondary | ICD-10-CM | POA: Diagnosis not present

## 2019-09-03 DIAGNOSIS — I10 Essential (primary) hypertension: Secondary | ICD-10-CM | POA: Diagnosis not present

## 2019-09-03 DIAGNOSIS — E1165 Type 2 diabetes mellitus with hyperglycemia: Secondary | ICD-10-CM | POA: Diagnosis not present

## 2019-09-03 DIAGNOSIS — J449 Chronic obstructive pulmonary disease, unspecified: Secondary | ICD-10-CM | POA: Diagnosis not present

## 2019-09-03 DIAGNOSIS — F322 Major depressive disorder, single episode, severe without psychotic features: Secondary | ICD-10-CM | POA: Diagnosis not present

## 2019-09-05 ENCOUNTER — Other Ambulatory Visit: Payer: Self-pay | Admitting: Cardiology

## 2019-09-07 ENCOUNTER — Other Ambulatory Visit: Payer: Self-pay

## 2019-09-07 MED ORDER — ISOSORBIDE MONONITRATE ER 30 MG PO TB24: 30.0000 mg | ORAL_TABLET | Freq: Every day | ORAL | 0 refills | Status: DC

## 2019-09-21 ENCOUNTER — Other Ambulatory Visit: Payer: Self-pay

## 2019-09-21 MED ORDER — AMLODIPINE BESYLATE 5 MG PO TABS: 5.0000 mg | ORAL_TABLET | Freq: Every day | ORAL | 0 refills | Status: DC

## 2019-10-06 DIAGNOSIS — E1165 Type 2 diabetes mellitus with hyperglycemia: Secondary | ICD-10-CM | POA: Diagnosis not present

## 2019-11-05 DIAGNOSIS — E1165 Type 2 diabetes mellitus with hyperglycemia: Secondary | ICD-10-CM | POA: Diagnosis not present

## 2019-11-06 DIAGNOSIS — E1165 Type 2 diabetes mellitus with hyperglycemia: Secondary | ICD-10-CM | POA: Diagnosis not present

## 2019-12-05 ENCOUNTER — Other Ambulatory Visit: Payer: Self-pay | Admitting: Cardiology

## 2019-12-17 ENCOUNTER — Other Ambulatory Visit: Payer: Self-pay | Admitting: Cardiology

## 2020-01-04 DIAGNOSIS — C61 Malignant neoplasm of prostate: Secondary | ICD-10-CM | POA: Diagnosis not present

## 2020-01-04 DIAGNOSIS — D509 Iron deficiency anemia, unspecified: Secondary | ICD-10-CM | POA: Diagnosis not present

## 2020-01-04 DIAGNOSIS — D539 Nutritional anemia, unspecified: Secondary | ICD-10-CM | POA: Diagnosis not present

## 2020-01-04 DIAGNOSIS — E1165 Type 2 diabetes mellitus with hyperglycemia: Secondary | ICD-10-CM | POA: Diagnosis not present

## 2020-01-12 ENCOUNTER — Other Ambulatory Visit: Payer: Self-pay | Admitting: Physician Assistant

## 2020-01-12 ENCOUNTER — Ambulatory Visit
Admission: RE | Admit: 2020-01-12 | Discharge: 2020-01-12 | Disposition: A | Discharge status: Home / Self Care | Payer: Medicare Other | Source: Ambulatory Visit | Attending: Physician Assistant | Admitting: Physician Assistant

## 2020-01-12 DIAGNOSIS — M5136 Other intervertebral disc degeneration, lumbar region: Secondary | ICD-10-CM | POA: Diagnosis not present

## 2020-01-12 DIAGNOSIS — M1612 Unilateral primary osteoarthritis, left hip: Secondary | ICD-10-CM | POA: Diagnosis not present

## 2020-01-12 DIAGNOSIS — M545 Low back pain, unspecified: Secondary | ICD-10-CM

## 2020-01-16 ENCOUNTER — Other Ambulatory Visit: Payer: Self-pay | Admitting: Cardiology

## 2020-01-27 DIAGNOSIS — D509 Iron deficiency anemia, unspecified: Secondary | ICD-10-CM | POA: Diagnosis not present

## 2020-01-27 DIAGNOSIS — E1165 Type 2 diabetes mellitus with hyperglycemia: Secondary | ICD-10-CM | POA: Diagnosis not present

## 2020-01-27 DIAGNOSIS — E139 Other specified diabetes mellitus without complications: Secondary | ICD-10-CM | POA: Diagnosis not present

## 2020-01-27 DIAGNOSIS — C61 Malignant neoplasm of prostate: Secondary | ICD-10-CM | POA: Diagnosis not present

## 2020-01-29 ENCOUNTER — Other Ambulatory Visit: Payer: Self-pay | Admitting: Cardiology

## 2020-03-01 DIAGNOSIS — Z0000 Encounter for general adult medical examination without abnormal findings: Secondary | ICD-10-CM | POA: Diagnosis not present

## 2020-03-01 DIAGNOSIS — Z23 Encounter for immunization: Secondary | ICD-10-CM | POA: Diagnosis not present

## 2020-03-01 DIAGNOSIS — D509 Iron deficiency anemia, unspecified: Secondary | ICD-10-CM | POA: Diagnosis not present

## 2020-03-01 DIAGNOSIS — Z1389 Encounter for screening for other disorder: Secondary | ICD-10-CM | POA: Diagnosis not present

## 2020-03-01 DIAGNOSIS — E785 Hyperlipidemia, unspecified: Secondary | ICD-10-CM | POA: Diagnosis not present

## 2020-03-01 DIAGNOSIS — F322 Major depressive disorder, single episode, severe without psychotic features: Secondary | ICD-10-CM | POA: Diagnosis not present

## 2020-03-01 DIAGNOSIS — E1165 Type 2 diabetes mellitus with hyperglycemia: Secondary | ICD-10-CM | POA: Diagnosis not present

## 2020-03-05 ENCOUNTER — Other Ambulatory Visit: Payer: Self-pay | Admitting: Cardiology

## 2020-03-07 ENCOUNTER — Other Ambulatory Visit: Payer: Self-pay | Admitting: Cardiology

## 2020-03-07 DIAGNOSIS — I10 Essential (primary) hypertension: Secondary | ICD-10-CM | POA: Diagnosis not present

## 2020-03-07 DIAGNOSIS — E1165 Type 2 diabetes mellitus with hyperglycemia: Secondary | ICD-10-CM | POA: Diagnosis not present

## 2020-03-07 DIAGNOSIS — E1122 Type 2 diabetes mellitus with diabetic chronic kidney disease: Secondary | ICD-10-CM | POA: Diagnosis not present

## 2020-03-07 DIAGNOSIS — E785 Hyperlipidemia, unspecified: Secondary | ICD-10-CM | POA: Diagnosis not present

## 2020-03-21 DIAGNOSIS — E139 Other specified diabetes mellitus without complications: Secondary | ICD-10-CM | POA: Diagnosis not present

## 2020-03-21 DIAGNOSIS — E785 Hyperlipidemia, unspecified: Secondary | ICD-10-CM | POA: Diagnosis not present

## 2020-03-21 DIAGNOSIS — E1122 Type 2 diabetes mellitus with diabetic chronic kidney disease: Secondary | ICD-10-CM | POA: Diagnosis not present

## 2020-03-21 DIAGNOSIS — I10 Essential (primary) hypertension: Secondary | ICD-10-CM | POA: Diagnosis not present

## 2020-04-05 DIAGNOSIS — E1165 Type 2 diabetes mellitus with hyperglycemia: Secondary | ICD-10-CM | POA: Diagnosis not present

## 2020-04-07 DIAGNOSIS — E1165 Type 2 diabetes mellitus with hyperglycemia: Secondary | ICD-10-CM | POA: Diagnosis not present

## 2020-04-23 IMAGING — US US THYROID
1 series · 14 of 25 positions shown · non-contrast
Comparison: None.

CLINICAL DATA: Other.  Primary hyperparathyroidism

EXAM:
THYROID ULTRASOUND
TECHNIQUE: Ultrasound examination of the thyroid gland and adjacent soft
tissues was performed.

[Series 1: us thyroid · 0.05mm/px · 14 of 31 slices shown]
[im 1/31]
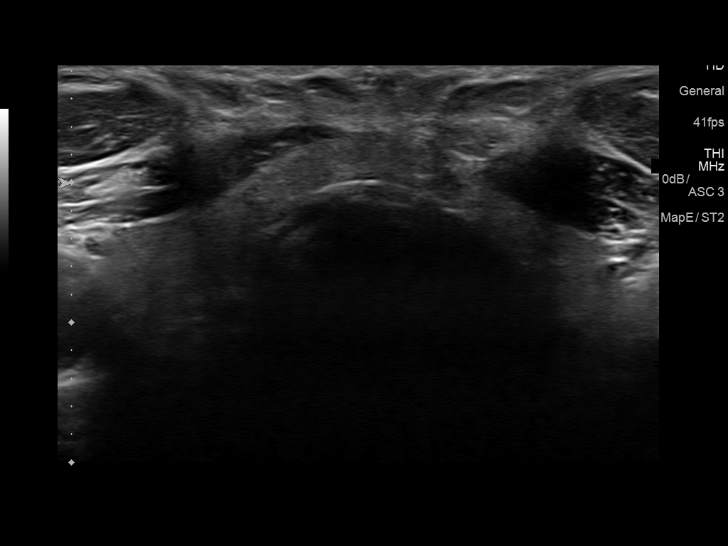
[im 3/31]
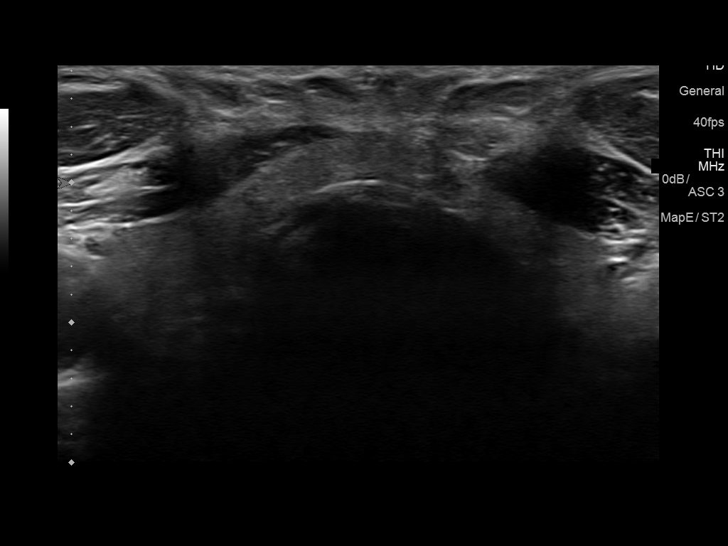
[im 6/31]
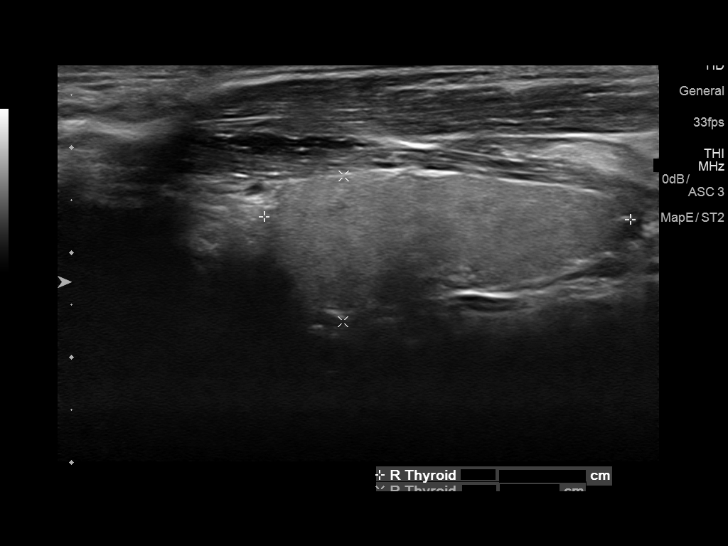
[im 8/31]
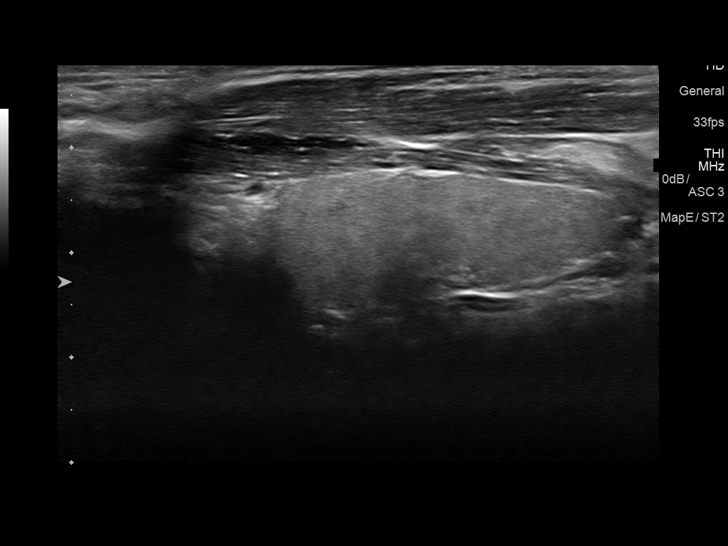
[im 11/31]
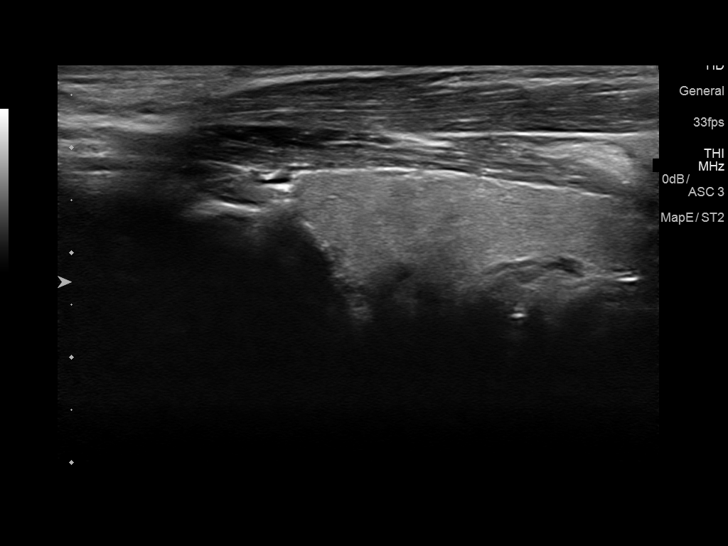
[im 12/31]
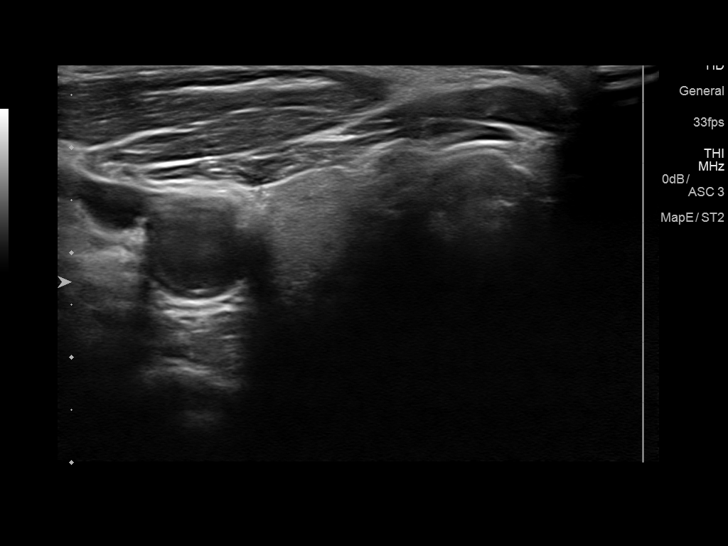
[im 14/31]
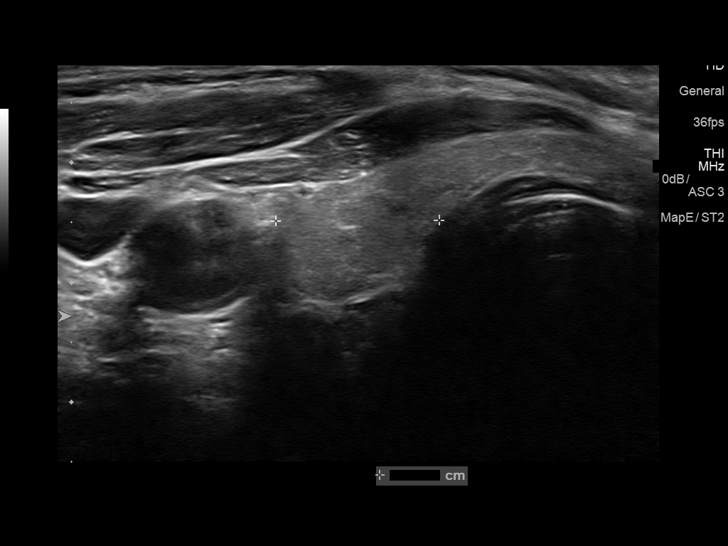
[im 17/31]
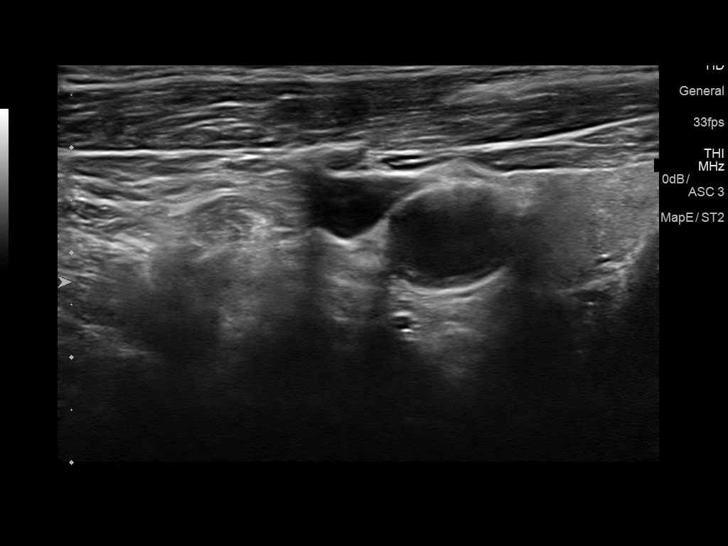
[im 19/31]
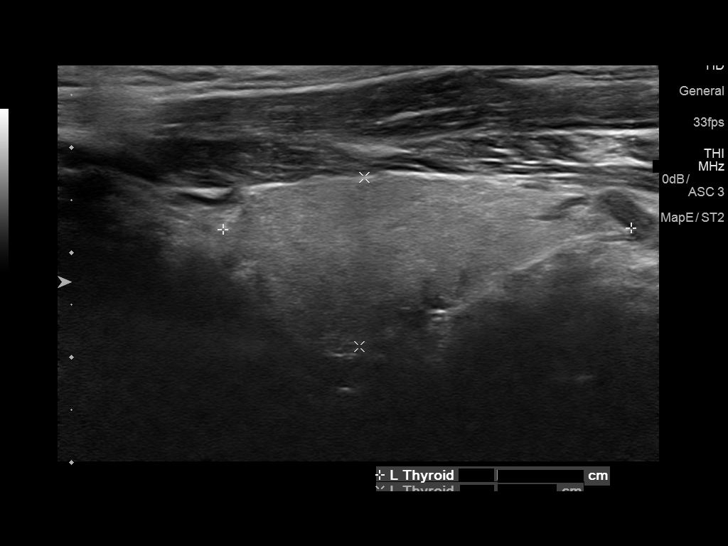
[im 21/31]
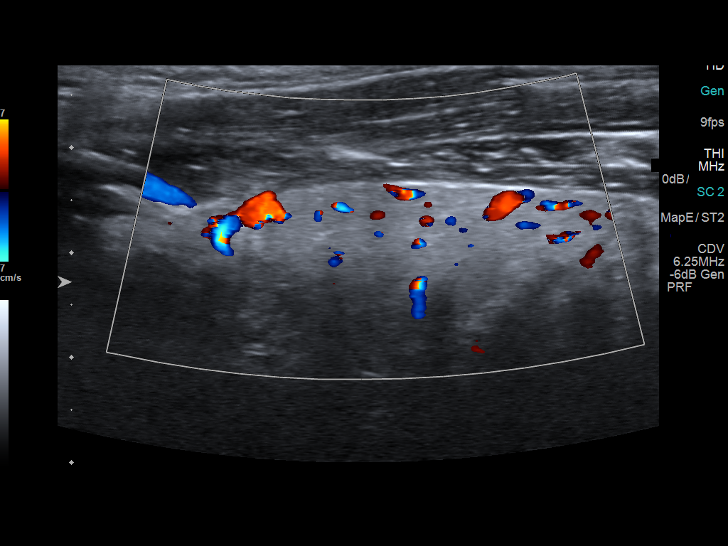
[im 23/31]
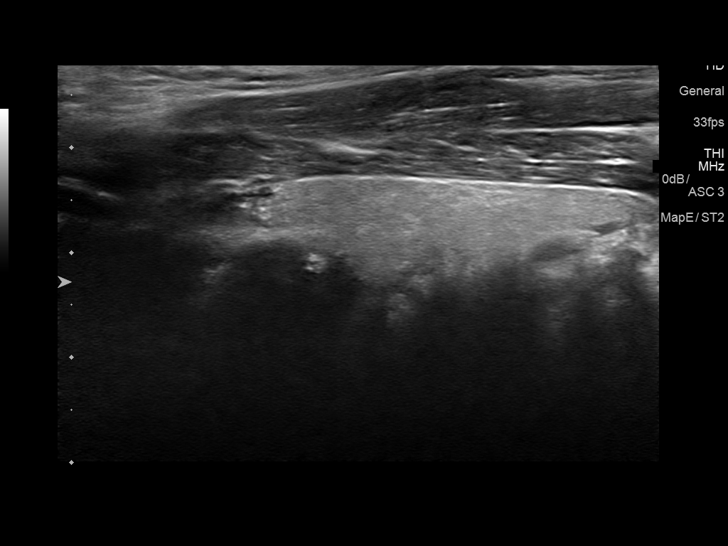
[im 26/31]
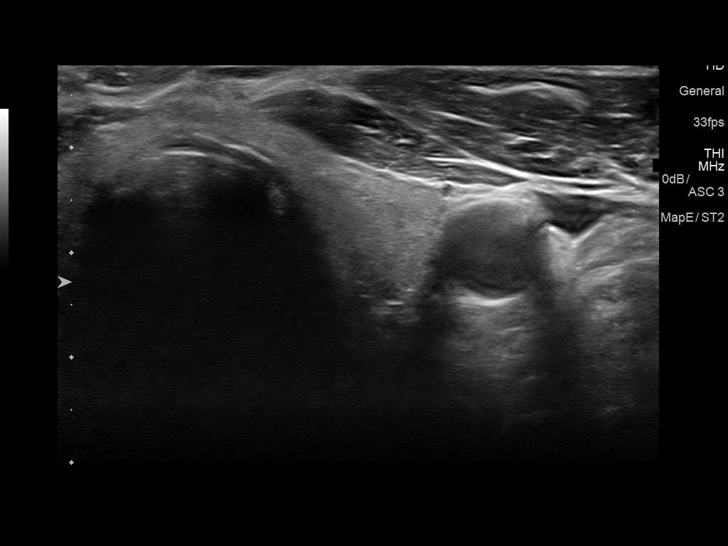
[im 28/31]
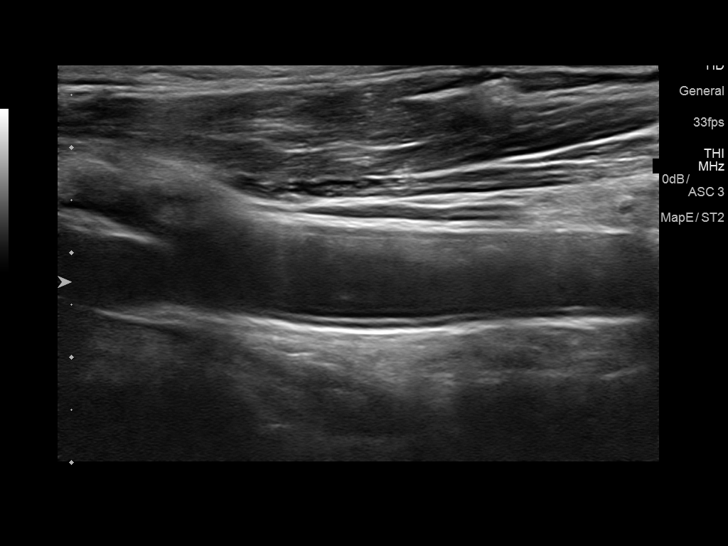
[im 31/31]
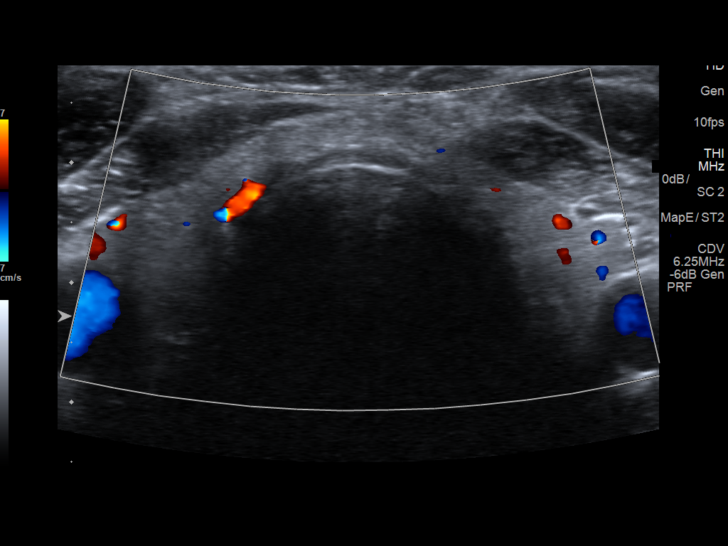

[14 of 25 positions shown; findings below may reference images not displayed]

FINDINGS: Parenchymal Echotexture: Normal

Isthmus: 0.4 cm

Right lobe: 3.5 x 1.4 x 1.4 cm

Left lobe: 3.9 x 1.6 x 1.3 cm

_________________________________________________________

Estimated total number of nodules >/= 1 cm: 0

Number of spongiform nodules >/=  2 cm not described below (TR1): 0

Number of mixed cystic and solid nodules >/= 1.5 cm not described
below (TR2): 0

_________________________________________________________

No discrete nodules are seen within the thyroid gland.
IMPRESSION: Within normal limits.  No evidence of abnormal mass.

The above is in keeping with the ACR TI-RADS recommendations - [HOSPITAL] 8224;[DATE].

## 2020-05-08 DIAGNOSIS — I10 Essential (primary) hypertension: Secondary | ICD-10-CM | POA: Diagnosis not present

## 2020-05-08 DIAGNOSIS — E1165 Type 2 diabetes mellitus with hyperglycemia: Secondary | ICD-10-CM | POA: Diagnosis not present

## 2020-05-08 DIAGNOSIS — E785 Hyperlipidemia, unspecified: Secondary | ICD-10-CM | POA: Diagnosis not present

## 2020-05-08 DIAGNOSIS — E1122 Type 2 diabetes mellitus with diabetic chronic kidney disease: Secondary | ICD-10-CM | POA: Diagnosis not present

## 2020-05-12 ENCOUNTER — Other Ambulatory Visit: Payer: Self-pay

## 2020-05-12 ENCOUNTER — Ambulatory Visit: Payer: Medicare Other | Admitting: Internal Medicine

## 2020-05-12 ENCOUNTER — Encounter: Payer: Self-pay | Admitting: Internal Medicine

## 2020-05-12 VITALS — BP 126/60 | HR 72 | Temp 97.8°F | Ht 69.0 in | Wt 170.0 lb

## 2020-05-12 DIAGNOSIS — Z8679 Personal history of other diseases of the circulatory system: Secondary | ICD-10-CM | POA: Diagnosis not present

## 2020-05-12 DIAGNOSIS — R0609 Other forms of dyspnea: Secondary | ICD-10-CM

## 2020-05-12 DIAGNOSIS — R0989 Other specified symptoms and signs involving the circulatory and respiratory systems: Secondary | ICD-10-CM

## 2020-05-12 DIAGNOSIS — R0600 Dyspnea, unspecified: Secondary | ICD-10-CM

## 2020-05-12 DIAGNOSIS — R911 Solitary pulmonary nodule: Secondary | ICD-10-CM | POA: Diagnosis not present

## 2020-05-12 DIAGNOSIS — Z87891 Personal history of nicotine dependence: Secondary | ICD-10-CM

## 2020-05-12 NOTE — Progress Notes (Signed)
Subjective:     Patient ID: Brian Hudson, male   DOB: March 14, 1951, 70 y.o.   MRN: [id number]  HPI PCP Brian Huddle, MD   IOV 08/21/2016  Chief Complaint  Patient presents with  . Pulm Consult    Referred by Brian Kindle PA-C for vasculitis.    70 year old male referred by Brian. Gavin Hudson and of physician assistant for evaluation of shortness of breath in the setting of previous diagnosis of Wegner's. There is very little outside information but in terms of his Wegener's granulomatosis he tells me that he was diagnosed with this in 2004 following significant sinus symptoms including bloody sinus drainage. This was at [facility]. He was treated for approximately one year with Cytoxan and subsequently on maintenance methotrexate. His last treatment for Wegener's was in 2008. Since then he's been on observation therapy. Treatment Back then  included prednisone as well. Then approximately 2 or 3 years ago he switched care to Brian. Gavin Hudson and rheumatology. According to him his been on observation therapy there as well. He tells me now for the last year or 2 he's been more fatigue associated with shortness of breath. He used to walk 2 miles a day but now is unable to do that. Dyspnea is present all the time but definitely more so with exertion and relieved by rest. There is associated cough and sometimes hemoptysis especially early in the morning but this is been ongoing for several years. He thinks his Wegener's might be active. He believes Brian. Gavin Hudson is done some blood work to look for vasculitis activity but I do not have those results with me. A CT scan of the chest was done that showed left upper lobe groundglass opacity and therefore he is been here. He denies any chest pain     FeNO 34ppb 08/21/2016 and slightly high  Walking desaturation test on 08/21/2016 185 feet x 3 laps on RA:  did NOT desaturate. Rest pulse ox was 100%, final pulse ox was 90. HR response  50/min at rest to 62/min at peak exertion.    IMPRESSION: CT scan of the chest personally visualized 1. No acute abnormality is seen on CT of the chest. 2. Coronary artery calcifications. 3. Minimal ground-glass opacity in the left upper lobe of doubtful significance. Initial follow-up with CT at 6-12 months is recommended to confirm persistence. If persistent, repeat CT is recommended every 2 years until 5 years of stability has been established. This recommendation follows the consensus statement: Guidelines for Management of Incidental Pulmonary Nodules Detected on CT Images: From the Fleischner Society 2017; Radiology 2017; 284:228-243.   Electronically Signed   By: Brian Hudson M.D.   On: 06/11/2016 17:14   09/11/2016 PFT' Follow Up: Brian Hudson is a 70 y.o. male former smoker quit in 1994 with history of Wegner's Disease since 2004, and dyspnea on exertion.  Pt. Was seen by Brian. Chase Hudson 08/21/2016 for  dyspnea .Plan at that time was as follows: PLAN  - start symbicort 2 puff twice daily - take sample worth 1 month  - do ono test room air next few to several days  - refer cardiology  - Brian Hudson or [facility] first available - sign record releast to get more info on wegner from Brian Hudson esp blood work  - do full PFT  Patient presents today for follow-up. Patient has been compliant with Symbicort since 08/21/2016. He states he cannot tell any significant difference. In his dyspnea.  He states he continues to get short of breath at times with exercise and also when talking. He states his shortness of breath does not wake him from sleep. He continues to be unable to walk as he has done in the past. He was seen by Brian Hudson at [facility] heart care on 09/06/2016. Of note there is a significant family history which includes coronary artery disease in his father, who had MI at the age of 70.Risk factors include CAD, DM, HLD, + FH premature CAD, hx tobacco use and coronary calcifications on CT  of chest.The patient is Scheduled for an exercise stress test 6/14 and an event monitor 6/14 to rule out tachy bradycardia syndrome. He states he does have secretions in the morning, yellowish with occasional blood tinged secretions, that self resolves..Lab work has not been sent from Brian. Trudie Hudson In [place], therefore I am unable to review. Patient denies any fever, chest pain, orthopnea, or hemoptysis.   Test Results:  CT angiogram: 06/11/2016 No acute abnormality is seen on CT of the chest. 2. Coronary artery calcifications. 3. Minimal ground-glass opacity in the left upper lobe of doubtful significance. Initial follow-up with CT at 6-12 months is recommended to confirm persistence  Ambulatory saturation: 08/21/2016 185 feet 3 laps on room air. Did not desaturate. Resting pulse ox was 100%. Final pulse ox was 90%. Heart rate response 50/m at rest is 62/m at peak exertion.  EKG 09/06/2016 Sinus bradycardia with poor R-wave progression, heart rate 44   OV 03/24/2017 Chief Complaint  Patient presents with  . Follow-up    CT scan 03/14/17.  Pt states that he still has some tightness in chest, has occ coughing, and is SOB all the time.  Pt also states that he has gotten choked several times while trying to eat.   Remote Wegner's granulomatosis in 2004 status post therapy. Remote smoking history  Follow-up shortness of breath: eing seen in pulmonary clinic for shortness of breath. Earlier this year we referred him to cardiology on basis of coronary artery calcification and strong family history. He status post coronary artery stent 2. After that his cardiac rehabilitation. Dyspnea is only some better he still has residual dyspnea  Follow-up left upper lobe lung nodule is a groundglass opacity. He had follow-up CT scan of the chest without contrast 03/14/2017. This is documented below. It appears the ground glass opacity persists but it is also "close to 2 cm. However he says that his  Brian Hudson is under remission. He did see Brian. Trudie Hudson earlier this year and apparently blood work was normal.  New issue: Is complaining of nonspecific dysphagia 4 times in the last 2 weeks. It is for solids. He is pretty significant. No associated vomiting or aspiration. His sister confirms the same.  IMPRESSION: ct chest 03/14/17 Persistent 12 x 19 mm ground-glass opacity in the central left upper Lobe. Adenocarcinoma cannot be excluded. Follow up by CT is recommended in 12 months, with continued annual surveillance for a minimum of 3 Years. These recommendations are taken from: Recommendations for the Management of Subsolid Pulmonary Nodules Detected at CT: A Statement from the [facility] 2013; 266:1, [phone].  Aortic Atherosclerosis (ICD10-I70.0).   Electronically Signed   By: Julian Hy M.D.   On: 03/14/2017 09:16   OV 04/29/2017  Chief Complaint  Patient presents with  . Follow-up    PET scan done 04/11/17.  Pt has complaints of coughing (non-productive), shortness of breath     Follow-up shortness of breath  in this patient with remote [place] history and remote smoking history Follow-up left lower lobe lung nodule  In terms of shortness of breath: He is compliant with his Symbicort for empiric asthma treatment.  He had coronary artery stents in the middle of 2018.  Despite this he is short of breath.  He says this frustrates him significantly.  He tells me that he is able to walk a block or climb steps without a problem or less of a problem.  And otherwise he does not been otherwise he does have some dyspnea on exertion for these activities.  But the significant amount of dyspnea happens when he is talking a lot.  He feels this is Wegener's dj vu again.  He did have Wegener's serology and did see Brian. Trudie Hudson in rheumatology end of 2018 and there is no evidence of Wegener's recurrence but he still feels this is white not measurable.  He has an appointment  upcoming with Brian. Candee Furbish his cardiologist.  In terms of left lower lobe lung nodule groundglass opacity: This was noted December 2018 CT chest.  He had a follow-up CT PET in January 2019 that showed this nodule was beginning to shrink.  His Wegner antibodies were negative/trace positive.  He has been reassured.  He requires a follow-up CT scan end of 2019.  New issue: Is complaining of chronic sinus discharge for months.  He feels is all Wegener's again.  I have repeatedly told him that so far we do not have evidence of recurrence.  OV 06/13/2017  Chief Complaint  Patient presents with  . Follow-up    lung nodule, no SOB, no Wheezing, No chest tightness    Follow-up dyspnea: In terms of dyspnea he had a cardiopulmonary stress test that suggested coronary issues.  He then had a cardiac cath in February 2019 and the place a second coronary stent.  He tells me now that his dyspnea is better.  In fact when he talks he gets dyspnea and even this is better but he still has residual dyspnea.  He is trying to get into cardiac rehab and is waiting to hear from them.  There is no wheezing or cough  In terms of left lower lobe lung nodule groundglass opacity: He has follow-up scan pending in December 2019.  There are no other new issues.  OV 05/12/2020  Subjective:  Patient ID: Brian Hudson, male , DOB: 12-Apr-1950 , age 71 y.o. , MRN: [id number] , ADDRESS: [place] [address] PCP Brian Huddle, MD Patient Care Team: Brian Huddle, MD as PCP - General (Internal Medicine) Jerline Pain, MD as PCP - Cardiology (Cardiology)  This Provider for this visit: Treatment Team:  Attending Provider: Brand Males, MD    05/12/2020 -   Chief Complaint  Patient presents with  . Follow-up    SOB getting worse.    Follow-up history of Wegener's with left lower lobe nodule -Follow-up remote smoking Follow-up dyspnea on exertion -Has associated coronary artery  disease.   HPI Brian Hudson 70 y.o. -almost 3 years since I last saw him.  This is just under 3-year visit.  This is the further routine follow-up visit.  He tells me that in the last 3 years he had insidious onset of worsening shortness of breath.  This is accelerated in the last 6 months.  Talking makes it worse the worst is when he talks.  Although it is exertional when he change his clothes and  he climbs stairs.  He looks like of having a flat affect but it did not discuss any anxiety or depression in him.  He has not seen the cardiologist in a while.  He says he has upcoming appointment although I did not see it.  His cardiologist Brian. Candee Furbish.  He has a history of associated coronary artery disease and coronary artery stents but he denies any chest pain.  No orthopnea proximal nocturnal dyspnea or wheezing.  He does have a tickle in his throat when he lies down but otherwise no cough.  Last CT scan of the chest 2019 showing slight reduction in groundglass lesion in the left upper lobe.    CT Chest dat - dec 2019   IMPRESSION: 1. Stable to slightly smaller left upper lobe ground-glass lesion, measuring 9 x 18 mm today. Follow up by CT is recommended in 12 months, with continued annual surveillance for a minimum of 3 years. These recommendations are taken from: Recommendations for the Management of Subsolid Pulmonary Nodules Detected at CT: A Statement from the [facility] 2013; 266:1, [id number]. 2.  Aortic Atherosclerois (ICD10-170.0)   Electronically Signed   By: Misty Stanley M.D.   On: 03/11/2018 17:00    PFT  No flowsheet data found.     has a past medical history of Arthritis, CAD (coronary artery disease), Concussion, Depression, DM (diabetes mellitus) ([place]), Dyspnea, Dysrhythmia, ED (erectile dysfunction), Fracture (2010), GERD (gastroesophageal reflux disease), HTN (hypertension), Hyperlipidemia, IBS (irritable bowel syndrome), Palpitations,  Peripheral vascular disease ([place]), Pneumonia, Prostate cancer ([place]) (2007), Sigmoid diverticulosis, and Wegener's granulomatosis.   reports that he quit smoking about 29 years ago. His smoking use included cigarettes. He has a 105.00 pack-year smoking history. He has never used smokeless tobacco.  Past Surgical History:  Procedure Laterality Date  . CARDIAC CATHETERIZATION    . COLONOSCOPY N/A 06/24/2016   Procedure: COLONOSCOPY with propofol;  Surgeon: Garlan Fair, MD;  Location: [facility];  Service: Endoscopy;  Laterality: N/A;  . colonscopy  2008  . CORONARY STENT INTERVENTION N/A 09/20/2016   Procedure: Coronary Stent Intervention;  Surgeon: Leonie Man, MD;  Location: [place] [facility];  Service: Cardiovascular;  Laterality: N/A;  . CORONARY STENT INTERVENTION N/A 05/23/2017   Procedure: CORONARY STENT INTERVENTION;  Surgeon: Burnell Blanks, MD;  Location: [place] [facility];  Service: Cardiovascular;  Laterality: N/A;  . INSERTION PROSTATE RADIATION SEED  2007  . INTRAVASCULAR PRESSURE WIRE/FFR STUDY N/A 09/20/2016   Procedure: Intravascular Pressure Wire/FFR Study;  Surgeon: Leonie Man, MD;  Location: [place] [facility];  Service: Cardiovascular;  Laterality: N/A;  . LEFT HEART CATH AND CORONARY ANGIOGRAPHY N/A 09/20/2016   Procedure: Left Heart Cath and Coronary Angiography;  Surgeon: Leonie Man, MD;  Location: [place] [facility];  Service: Cardiovascular;  Laterality: N/A;  . LEFT HEART CATH AND CORONARY ANGIOGRAPHY N/A 05/23/2017   Procedure: LEFT HEART CATH AND CORONARY ANGIOGRAPHY;  Surgeon: Burnell Blanks, MD;  Location: [place] [facility];  Service: Cardiovascular;  Laterality: N/A;  . PARATHYROIDECTOMY Right 02/26/2018   Procedure: RIGHT INFERIOR PARATHYROIDECTOMY;  Surgeon: Armandina Gemma, MD;  Location: [facility];  Service: General;  Laterality: Right;  . ROTATOR CUFF REPAIR Right     Allergies  Allergen Reactions  . Codeine Other (See  Comments)    "jittery"  . Lipitor [Atorvastatin] Other (See Comments)    arthralgia  . Zetia [Ezetimibe] Other (See Comments)    arthralgia  .  Zocor [Simvastatin] Other (See Comments)    arthralgia    Immunization History  Administered Date(s) Administered  . Influenza, High Dose Seasonal PF 03/09/2015, 01/06/2017, 01/22/2017, 01/08/2018, 01/20/2019, 03/01/2020  . PFIZER(Purple Top)SARS-COV-2 Vaccination 12/09/2019, 12/31/2019  . Pneumococcal Polysaccharide-23 02/19/2012    Family History  Problem Relation Age of Onset  . Heart attack Father 47  . CAD Father   . Diabetic kidney disease Mother   . Healthy Sister   . Cancer Brother   . Healthy Sister      Current Outpatient Medications:  .  acetaminophen (TYLENOL) 325 MG tablet, Take 650 mg by mouth every morning. , Disp: , Rfl:  .  amLODipine (NORVASC) 5 MG tablet, Take 1 tablet (5 mg total) by mouth daily. Please call and schedule an appointment for further refills 2nd attempt, Disp: 14 tablet, Rfl: 0 .  aspirin EC 81 MG tablet, Take 81 mg by mouth at bedtime., Disp: , Rfl:  .  buPROPion (ZYBAN) 150 MG 12 hr tablet, Take 150 mg by mouth every morning., Disp: , Rfl: 5 .  clopidogrel (PLAVIX) 75 MG tablet, Take 1 tablet (75 mg total) by mouth daily. Please make overdue appt with Brian. Marlou Porch before anymore refills. Thank you 1st attempt, Disp: 30 tablet, Rfl: 0 .  escitalopram (LEXAPRO) 10 MG tablet, Take 10 mg by mouth daily., Disp: , Rfl:  .  glimepiride (AMARYL) 1 MG tablet, Take 1 mg by mouth daily with breakfast., Disp: , Rfl:  .  ipratropium-albuterol (DUONEB) 0.5-2.5 (3) MG/3ML SOLN, Take 3 mLs by nebulization every 6 (six) hours as needed (wheezing or shortness of breath)., Disp: , Rfl:  .  isosorbide mononitrate (IMDUR) 30 MG 24 hr tablet, Take 1 tablet (30 mg total) by mouth daily. Please make overdue appt with Brian. Marlou Porch before anymore refills. Thank you 1st attempt, Disp: 30 tablet, Rfl: 0 .  Multiple Vitamin  (MULTIVITAMIN WITH MINERALS) TABS tablet, Take 1 tablet by mouth daily., Disp: , Rfl:  .  nitroGLYCERIN (NITROSTAT) 0.4 MG SL tablet, Place 1 tablet (0.4 mg total) under the tongue every 5 (five) minutes x 3 doses as needed for chest pain., Disp: 25 tablet, Rfl: 5 .  pantoprazole (PROTONIX) 40 MG tablet, Take 1 tablet (40 mg total) by mouth 2 (two) times daily., Disp: 60 tablet, Rfl: 2 .  rosuvastatin (CRESTOR) 5 MG tablet, Take 10 mg by mouth daily. , Disp: , Rfl:  .  sitaGLIPtin (JANUVIA) 100 MG tablet, Take 100 mg by mouth daily. , Disp: , Rfl:  .  sodium chloride (OCEAN) 0.65 % SOLN nasal spray, Place 1 spray into both nostrils daily as needed for congestion. , Disp: , Rfl:  .  SYMBICORT 160-4.5 MCG/ACT inhaler, INHALE 2 PUFFS INTO THE LUNGS TWICE DAILY, Disp: 10.2 g, Rfl: 5 .  traMADol (ULTRAM) 50 MG tablet, Take 1-2 tablets (50-100 mg total) by mouth every 6 (six) hours as needed., Disp: 15 tablet, Rfl: 0      Objective:   Vitals:   05/12/20 1104  BP: 126/60  Pulse: 72  Temp: 97.8 F (36.6 C)  SpO2: 98%  Weight: 170 lb (77.1 kg)  Height: 5\' 9"  (1.753 m)    Estimated body mass index is 25.1 kg/m as calculated from the following:   Height as of this encounter: 5\' 9"  (1.753 m).   Weight as of this encounter: 170 lb (77.1 kg).    Filed Weights   05/12/20 1104  Weight: 170 lb (77.1 kg)  Physical Exam   General: No distress. Looks flast affect Neuro: Alert and Oriented x 3. GCS 15. Speech normal Psych: Pleasant Resp:  Barrel Chest - no.  Wheeze - no, Crackles - no, No overt respiratory distress CVS: Normal heart sounds. Murmurs - no Ext: Stigmata of Connective Tissue Disease - no HEENT: Normal upper airway. PEERL +. No post nasal drip        Assessment:       ICD-10-CM   1. Dyspnea on exertion  R06.00   2. Tickle in throat  R09.89   3. Nodule of left lung  R91.1   4. History of granulomatosis with polyangiitis  Z86.79   5. Stopped smoking with greater than  40 pack year history  Z87.891        Plan:     Patient Instructions  History of Wegener's granulomatosis none Nodule of left lung - last seen dec 2019 Hx of CAD Dyspnea on exertion Tickle in throat  -Unclear why shortness of breath is worse  Plan  -Get high-resolution CT chest supine and prone -Do full pulmonary function test -do feno breathing test  -Do overnight oxygen study on room air - Do 2d Echo  Follow-up -Return to see me Brian. Chase Hudson the next few to several weeks but after completing the above  - simple walk test and symptom score at the time of return      SIGNATURE    Brian. Brand Males, M.D., F.C.C.P,  Pulmonary and Critical Care Medicine Staff Physician, [place] Director - Interstitial Lung Disease  Program  P[facility] [place] [facility] [place], [place], [address]  Pager: [phone], If no answer or between  15:00h - 7:00h: call [phone] Telephone: [phone]  11:41 AM 05/12/2020

## 2020-05-12 NOTE — Patient Instructions (Addendum)
History of Wegener's granulomatosis none Nodule of left lung - last seen dec 2019 Hx of CAD Dyspnea on exertion Tickle in throat  -Unclear why shortness of breath is worse  Plan  -Get high-resolution CT chest supine and prone -Do full pulmonary function test -do feno breathing test  -Do overnight oxygen study on room air - Do 2d Echo  Follow-up -Return to see me [name] the next few to several weeks but after completing the above  - simple walk test and symptom score at the time of return

## 2020-05-29 ENCOUNTER — Telehealth: Payer: Self-pay | Admitting: Cardiology

## 2020-05-29 ENCOUNTER — Other Ambulatory Visit: Payer: Medicare Other

## 2020-05-29 MED ORDER — NITROGLYCERIN 0.4 MG SL SUBL: 0.4000 mg | SUBLINGUAL_TABLET | SUBLINGUAL | 1 refills | Status: DC | PRN

## 2020-05-29 MED ORDER — CLOPIDOGREL BISULFATE 75 MG PO TABS: 75.0000 mg | ORAL_TABLET | Freq: Every day | ORAL | 1 refills | Status: DC

## 2020-05-29 MED ORDER — AMLODIPINE BESYLATE 5 MG PO TABS: 5.0000 mg | ORAL_TABLET | Freq: Every day | ORAL | 1 refills | Status: DC

## 2020-05-29 MED ORDER — ISOSORBIDE MONONITRATE ER 30 MG PO TB24: 30.0000 mg | ORAL_TABLET | Freq: Every day | ORAL | 1 refills | Status: DC

## 2020-05-29 NOTE — Telephone Encounter (Signed)
*  STAT* If patient is at the pharmacy, call can be transferred to refill team.   1. Which medications need to be refilled? (please list name of each medication and dose if known) amLODipine (NORVASC) 5 MG tablet, clopidogrel (PLAVIX) 75 MG tablet, isosorbide mononitrate (IMDUR) 30 MG 24 hr tablet, nitroGLYCERIN (NITROSTAT) 0.4 MG SL tablet  2. Which pharmacy/location (including street and city if local pharmacy) is medication to be sent to? [id number] - [place], [place] - [address]  3. Do they need a 30 day or 90 day supply? 90   Patient scheduled for 07/12/20 with Richardson Dopp.

## 2020-05-29 NOTE — Telephone Encounter (Signed)
Pt's medications were sent to pt's pharmacy as requested. Confirmation received.  

## 2020-06-05 DIAGNOSIS — E1165 Type 2 diabetes mellitus with hyperglycemia: Secondary | ICD-10-CM | POA: Diagnosis not present

## 2020-06-07 ENCOUNTER — Ambulatory Visit
Admission: RE | Admit: 2020-06-07 | Discharge: 2020-06-07 | Disposition: A | Discharge status: Home / Self Care | Payer: Medicare Other | Source: Ambulatory Visit | Attending: Internal Medicine | Admitting: Internal Medicine

## 2020-06-07 DIAGNOSIS — R0600 Dyspnea, unspecified: Secondary | ICD-10-CM

## 2020-06-07 DIAGNOSIS — R0602 Shortness of breath: Secondary | ICD-10-CM | POA: Diagnosis not present

## 2020-06-07 DIAGNOSIS — R0609 Other forms of dyspnea: Secondary | ICD-10-CM

## 2020-06-07 DIAGNOSIS — R918 Other nonspecific abnormal finding of lung field: Secondary | ICD-10-CM | POA: Diagnosis not present

## 2020-06-09 DIAGNOSIS — M7541 Impingement syndrome of right shoulder: Secondary | ICD-10-CM | POA: Diagnosis not present

## 2020-06-12 ENCOUNTER — Other Ambulatory Visit: Payer: Self-pay

## 2020-06-12 ENCOUNTER — Ambulatory Visit (HOSPITAL_COMMUNITY): Payer: Medicare Other | Attending: Internal Medicine

## 2020-06-12 DIAGNOSIS — R0600 Dyspnea, unspecified: Secondary | ICD-10-CM

## 2020-06-12 DIAGNOSIS — R0609 Other forms of dyspnea: Secondary | ICD-10-CM

## 2020-06-12 LAB — ECHOCARDIOGRAM COMPLETE
AR max vel: 1.74 cm2
AV Area VTI: 1.71 cm2
AV Area mean vel: 1.68 cm2
AV Mean grad: 10 mmHg
AV Peak grad: 20.4 mmHg
Ao pk vel: 2.26 m/s
Area-P 1/2: 2.76 cm2
S' Lateral: 3.1 cm

## 2020-06-13 DIAGNOSIS — I10 Essential (primary) hypertension: Secondary | ICD-10-CM | POA: Diagnosis not present

## 2020-06-13 DIAGNOSIS — E1165 Type 2 diabetes mellitus with hyperglycemia: Secondary | ICD-10-CM | POA: Diagnosis not present

## 2020-06-13 DIAGNOSIS — I2510 Atherosclerotic heart disease of native coronary artery without angina pectoris: Secondary | ICD-10-CM | POA: Diagnosis not present

## 2020-06-13 DIAGNOSIS — E785 Hyperlipidemia, unspecified: Secondary | ICD-10-CM | POA: Diagnosis not present

## 2020-06-28 ENCOUNTER — Other Ambulatory Visit: Payer: Self-pay

## 2020-06-28 ENCOUNTER — Ambulatory Visit (INDEPENDENT_AMBULATORY_CARE_PROVIDER_SITE_OTHER): Payer: Medicare Other | Admitting: Internal Medicine

## 2020-06-28 ENCOUNTER — Encounter: Payer: Self-pay | Admitting: Internal Medicine

## 2020-06-28 ENCOUNTER — Ambulatory Visit: Payer: Medicare Other | Admitting: Internal Medicine

## 2020-06-28 VITALS — BP 120/58 | HR 56 | Temp 97.3°F | Ht 68.0 in | Wt 170.0 lb

## 2020-06-28 DIAGNOSIS — Z8679 Personal history of other diseases of the circulatory system: Secondary | ICD-10-CM | POA: Diagnosis not present

## 2020-06-28 DIAGNOSIS — I5189 Other ill-defined heart diseases: Secondary | ICD-10-CM

## 2020-06-28 DIAGNOSIS — R0600 Dyspnea, unspecified: Secondary | ICD-10-CM

## 2020-06-28 DIAGNOSIS — R911 Solitary pulmonary nodule: Secondary | ICD-10-CM | POA: Diagnosis not present

## 2020-06-28 DIAGNOSIS — Z87891 Personal history of nicotine dependence: Secondary | ICD-10-CM

## 2020-06-28 DIAGNOSIS — R0609 Other forms of dyspnea: Secondary | ICD-10-CM

## 2020-06-28 DIAGNOSIS — M25511 Pain in right shoulder: Secondary | ICD-10-CM | POA: Diagnosis not present

## 2020-06-28 DIAGNOSIS — R0989 Other specified symptoms and signs involving the circulatory and respiratory systems: Secondary | ICD-10-CM

## 2020-06-28 LAB — PULMONARY FUNCTION TEST
DL/VA % pred: 78 %
DL/VA: 3.18 ml/min/mmHg/L
DLCO cor % pred: 95 %
DLCO cor: 23.01 ml/min/mmHg
DLCO unc % pred: 95 %
DLCO unc: 23.01 ml/min/mmHg
FEF 25-75 Post: 2.98 L/sec
FEF 25-75 Pre: 2.53 L/sec
FEF2575-%Change-Post: 17 %
FEF2575-%Pred-Post: 133 %
FEF2575-%Pred-Pre: 112 %
FEV1-%Change-Post: 3 %
FEV1-%Pred-Post: 120 %
FEV1-%Pred-Pre: 116 %
FEV1-Post: 3.57 L
FEV1-Pre: 3.45 L
FEV1FVC-%Change-Post: 1 %
FEV1FVC-%Pred-Pre: 101 %
FEV6-%Change-Post: 2 %
FEV6-%Pred-Post: 123 %
FEV6-%Pred-Pre: 120 %
FEV6-Post: 4.72 L
FEV6-Pre: 4.59 L
FEV6FVC-%Change-Post: 0 %
FEV6FVC-%Pred-Post: 106 %
FEV6FVC-%Pred-Pre: 105 %
FVC-%Change-Post: 2 %
FVC-%Pred-Post: 116 %
FVC-%Pred-Pre: 114 %
FVC-Post: 4.72 L
FVC-Pre: 4.63 L
Post FEV1/FVC ratio: 76 %
Post FEV6/FVC ratio: 100 %
Pre FEV1/FVC ratio: 75 %
Pre FEV6/FVC Ratio: 99 %
RV % pred: 90 %
RV: 2.13 L
TLC % pred: 106 %
TLC: 7.05 L

## 2020-06-28 LAB — NITRIC OXIDE: Nitric Oxide: 18

## 2020-06-28 MED ORDER — ARNUITY ELLIPTA 100 MCG/ACT IN AEPB: 1.0000 | INHALATION_SPRAY | Freq: Every day | RESPIRATORY_TRACT | 5 refills | Status: DC

## 2020-06-28 NOTE — Addendum Note (Signed)
Addended by: Lorretta Harp on: 06/28/2020 01:59 PM   Modules accepted: Orders

## 2020-06-28 NOTE — Progress Notes (Signed)
Subjective:     Patient ID: Brian Hudson, male   DOB: March 14, 1951, 70 y.o.   MRN: [id number]  HPI PCP Brian Huddle, MD   IOV 08/21/2016  Chief Complaint  Patient presents with  . Pulm Consult    Referred by Brian Kindle PA-C for vasculitis.    70 year old male referred by Dr. Gavin Hudson and of physician assistant for evaluation of shortness of breath in the setting of previous diagnosis of Wegner's. There is very little outside information but in terms of his Wegener's granulomatosis he tells me that he was diagnosed with this in 2004 following significant sinus symptoms including bloody sinus drainage. This was at [facility]. He was treated for approximately one year with Cytoxan and subsequently on maintenance methotrexate. His last treatment for Wegener's was in 2008. Since then he's been on observation therapy. Treatment Back then  included prednisone as well. Then approximately 2 or 3 years ago he switched care to Dr. Gavin Hudson and rheumatology. According to him his been on observation therapy there as well. He tells me now for the last year or 2 he's been more fatigue associated with shortness of breath. He used to walk 2 miles a day but now is unable to do that. Dyspnea is present all the time but definitely more so with exertion and relieved by rest. There is associated cough and sometimes hemoptysis especially early in the morning but this is been ongoing for several years. He thinks his Wegener's might be active. He believes Dr. Gavin Hudson is done some blood work to look for vasculitis activity but I do not have those results with me. A CT scan of the chest was done that showed left upper lobe groundglass opacity and therefore he is been here. He denies any chest pain     FeNO 34ppb 08/21/2016 and slightly high  Walking desaturation test on 08/21/2016 185 feet x 3 laps on RA:  did NOT desaturate. Rest pulse ox was 100%, final pulse ox was 90. HR response  50/min at rest to 62/min at peak exertion.    IMPRESSION: CT scan of the chest personally visualized 1. No acute abnormality is seen on CT of the chest. 2. Coronary artery calcifications. 3. Minimal ground-glass opacity in the left upper lobe of doubtful significance. Initial follow-up with CT at 6-12 months is recommended to confirm persistence. If persistent, repeat CT is recommended every 2 years until 5 years of stability has been established. This recommendation follows the consensus statement: Guidelines for Management of Incidental Pulmonary Nodules Detected on CT Images: From the Fleischner Society 2017; Radiology 2017; 284:228-243.   Electronically Signed   By: Brian Hudson M.D.   On: 06/11/2016 17:14   09/11/2016 PFT' Follow Up: Brian Hudson is a 70 y.o. male former smoker quit in 1994 with history of Wegner's Disease since 2004, and dyspnea on exertion.  Pt. Was seen by Dr. Chase Hudson 08/21/2016 for  dyspnea .Plan at that time was as follows: PLAN  - start symbicort 2 puff twice daily - take sample worth 1 month  - do ono test room air next few to several days  - refer cardiology  - Dr Brian Hudson or [facility] first available - sign record releast to get more info on wegner from Dr Brian Hudson esp blood work  - do full PFT  Patient presents today for follow-up. Patient has been compliant with Symbicort since 08/21/2016. He states he cannot tell any significant difference. In his dyspnea.  He states he continues to get short of breath at times with exercise and also when talking. He states his shortness of breath does not wake him from sleep. He continues to be unable to walk as he has done in the past. He was seen by Brian Hudson at [facility] heart care on 09/06/2016. Of note there is a significant family history which includes coronary artery disease in his father, who had MI at the age of 70.Risk factors include CAD, DM, HLD, + FH premature CAD, hx tobacco use and coronary calcifications on CT  of chest.The patient is Scheduled for an exercise stress test 6/14 and an event monitor 6/14 to rule out tachy bradycardia syndrome. He states he does have secretions in the morning, yellowish with occasional blood tinged secretions, that self resolves..Lab work has not been sent from Dr. Trudie Hudson In [place], therefore I am unable to review. Patient denies any fever, chest pain, orthopnea, or hemoptysis.   Test Results:  CT angiogram: 06/11/2016 No acute abnormality is seen on CT of the chest. 2. Coronary artery calcifications. 3. Minimal ground-glass opacity in the left upper lobe of doubtful significance. Initial follow-up with CT at 6-12 months is recommended to confirm persistence  Ambulatory saturation: 08/21/2016 185 feet 3 laps on room air. Did not desaturate. Resting pulse ox was 100%. Final pulse ox was 90%. Heart rate response 50/m at rest is 62/m at peak exertion.  EKG 09/06/2016 Sinus bradycardia with poor R-wave progression, heart rate 44   OV 03/24/2017 Chief Complaint  Patient presents with  . Follow-up    CT scan 03/14/17.  Pt states that he still has some tightness in chest, has occ coughing, and is SOB all the time.  Pt also states that he has gotten choked several times while trying to eat.   Remote Wegner's granulomatosis in 2004 status post therapy. Remote smoking history  Follow-up shortness of breath: eing seen in pulmonary clinic for shortness of breath. Earlier this year we referred him to cardiology on basis of coronary artery calcification and strong family history. He status post coronary artery stent 2. After that his cardiac rehabilitation. Dyspnea is only some better he still has residual dyspnea  Follow-up left upper lobe lung nodule is a groundglass opacity. He had follow-up CT scan of the chest without contrast 03/14/2017. This is documented below. It appears the ground glass opacity persists but it is also "close to 2 cm. However he says that his  [name] is under remission. He did see Dr. Trudie Hudson earlier this year and apparently blood work was normal.  New issue: Is complaining of nonspecific dysphagia 4 times in the last 2 weeks. It is for solids. He is pretty significant. No associated vomiting or aspiration. His sister confirms the same.  IMPRESSION: ct chest 03/14/17 Persistent 12 x 19 mm ground-glass opacity in the central left upper Lobe. Adenocarcinoma cannot be excluded. Follow up by CT is recommended in 12 months, with continued annual surveillance for a minimum of 3 Years. These recommendations are taken from: Recommendations for the Management of Subsolid Pulmonary Nodules Detected at CT: A Statement from the [facility] 2013; 266:1, [phone].  Aortic Atherosclerosis (ICD10-I70.0).   Electronically Signed   By: Julian Hy M.D.   On: 03/14/2017 09:16   OV 04/29/2017  Chief Complaint  Patient presents with  . Follow-up    PET scan done 04/11/17.  Pt has complaints of coughing (non-productive), shortness of breath     Follow-up shortness of breath  in this patient with remote [place] history and remote smoking history Follow-up left lower lobe lung nodule  In terms of shortness of breath: He is compliant with his Symbicort for empiric asthma treatment.  He had coronary artery stents in the middle of 2018.  Despite this he is short of breath.  He says this frustrates him significantly.  He tells me that he is able to walk a block or climb steps without a problem or less of a problem.  And otherwise he does not been otherwise he does have some dyspnea on exertion for these activities.  But the significant amount of dyspnea happens when he is talking a lot.  He feels this is Wegener's dj vu again.  He did have Wegener's serology and did see Dr. Trudie Hudson in rheumatology end of 2018 and there is no evidence of Wegener's recurrence but he still feels this is white not measurable.  He has an appointment  upcoming with Dr. Candee Furbish his cardiologist.  In terms of left lower lobe lung nodule groundglass opacity: This was noted December 2018 CT chest.  He had a follow-up CT PET in January 2019 that showed this nodule was beginning to shrink.  His Wegner antibodies were negative/trace positive.  He has been reassured.  He requires a follow-up CT scan end of 2019.  New issue: Is complaining of chronic sinus discharge for months.  He feels is all Wegener's again.  I have repeatedly told him that so far we do not have evidence of recurrence.  OV 06/13/2017  Chief Complaint  Patient presents with  . Follow-up    lung nodule, no SOB, no Wheezing, No chest tightness    Follow-up dyspnea: In terms of dyspnea he had a cardiopulmonary stress test that suggested coronary issues.  He then had a cardiac cath in February 2019 and the place a second coronary stent.  He tells me now that his dyspnea is better.  In fact when he talks he gets dyspnea and even this is better but he still has residual dyspnea.  He is trying to get into cardiac rehab and is waiting to hear from them.  There is no wheezing or cough  In terms of left lower lobe lung nodule groundglass opacity: He has follow-up scan pending in December 2019.  There are no other new issues.  OV 05/12/2020  Subjective:  Patient ID: Brian Hudson, male , DOB: 12-Apr-1950 , age 71 y.o. , MRN: [id number] , ADDRESS: [place] [address] PCP Brian Huddle, MD Patient Care Team: Brian Huddle, MD as PCP - General (Internal Medicine) Jerline Pain, MD as PCP - Cardiology (Cardiology)  This Provider for this visit: Treatment Team:  Attending Provider: Brand Males, MD    05/12/2020 -   Chief Complaint  Patient presents with  . Follow-up    SOB getting worse.    Follow-up history of Wegener's with left lower lobe nodule -Follow-up remote smoking Follow-up dyspnea on exertion -Has associated coronary artery  disease.   HPI QUANTAVIUS HUMM 70 y.o. -almost 3 years since I last saw him.  This is just under 3-year visit.  This is the further routine follow-up visit.  He tells me that in the last 3 years he had insidious onset of worsening shortness of breath.  This is accelerated in the last 6 months.  Talking makes it worse the worst is when he talks.  Although it is exertional when he change his clothes and  he climbs stairs.  He looks like of having a flat affect but it did not discuss any anxiety or depression in him.  He has not seen the cardiologist in a while.  He says he has upcoming appointment although I did not see it.  His cardiologist Dr. Candee Furbish.  He has a history of associated coronary artery disease and coronary artery stents but he denies any chest pain.  No orthopnea proximal nocturnal dyspnea or wheezing.  He does have a tickle in his throat when he lies down but otherwise no cough.  Last CT scan of the chest 2019 showing slight reduction in groundglass lesion in the left upper lobe.    CT Chest dat - dec 2019   IMPRESSION: 1. Stable to slightly smaller left upper lobe ground-glass lesion, measuring 9 x 18 mm today. Follow up by CT is recommended in 12 months, with continued annual surveillance for a minimum of 3 years. These recommendations are taken from: Recommendations for the Management of Subsolid Pulmonary Nodules Detected at CT: A Statement from the [place] [facility] 2013; 266:1, [phone]. 2.  Aortic Atherosclerois (ICD10-170.0)   Electronically Signed   By: Misty Stanley M.D.   On: 03/11/2018 17:00    PFT  No flowsheet data found.      OV 06/28/2020  Subjective:  Patient ID: Brian Hudson, male , DOB: 05/18/1950 , age 75 y.o. , MRN: [id number] , ADDRESS: [place] [address] PCP Brian Huddle, MD Patient Care Team: Brian Huddle, MD as PCP - General (Internal Medicine) Jerline Pain, MD as PCP - Cardiology  (Cardiology)  This Provider for this visit: Treatment Team:  Attending Provider: Brand Males, MD    06/28/2020 -   Chief Complaint  Patient presents with  . Follow-up    PFT performed today.  States he has been doing okay since last visit and denies any real complaints.   Follow-up history of Wegener's with left lower lobe nodule -Follow-up remote smoking Follow-up dyspnea on exertion -Has associated coronary artery disease.   HPI GERRARD CRYSTAL 69 y.o. -presents for follow-up of his worsening dyspnea.  This visit is to review test results.  He tells me that overall he is stable still has this dyspnea on exertion although this time he tells me it is present on and off.  Test results show the CT scan just shows mild air trapping his lung nodule is stable in 2-3 years but is worse over the last 4 years.  His pulmonary function test is normal his nitric oxide test is normal.  His echocardiogram shows grade 2 diastolic dysfunction.  He denies any hypertension obesity.  He has not seen his cardiologist Dr. Candee Furbish despite assurance at the last visit that he would see him.  He has never done pulmonary rehabilitation.  We discussed several care options and he has agreed to start with pulmonary rehabilitation and reestablish with cardiology.   FeNO   - 18ppb 06/28/2020   CT chest 06/07/20   IMPRESSION: 1. No evidence of interstitial lung disease. 2. Irregular ground-glass nodule in the left upper lobe, stable from 03/11/2018 but possibly slightly increased in prominence from baseline 06/11/2016. Follow-up CT chest without contrast in 2 years is recommended. This recommendation follows the consensus statement: Guidelines for Management of Small Pulmonary Nodules Detected on CT Images: From the Fleischner Society 2017; Radiology 2017; 284:228-243. 3. Mild air trapping is indicative of small airways disease. 4. Aortic atherosclerosis (ICD10-I70.0). Coronary  artery calcification.   Electronically Signed   By: Lorin Picket M.D.   On: 06/09/2020 09:25   PFT  PFT Results Latest Ref Rng & Units 06/28/2020  FVC-Pre L 4.63  FVC-Predicted Pre % 114  FVC-Post L 4.72  FVC-Predicted Post % 116  Pre FEV1/FVC % % 75  Post FEV1/FCV % % 76  FEV1-Pre L 3.45  FEV1-Predicted Pre % 116  FEV1-Post L 3.57  DLCO uncorrected ml/min/mmHg 23.01  DLCO UNC% % 95  DLCO corrected ml/min/mmHg 23.01  DLCO COR %Predicted % 95  DLVA Predicted % 78  TLC L 7.05  TLC % Predicted % 106  RV % Predicted % 90   Lab Results  Component Value Date   NITRICOXIDE 18 06/28/2020    ECHO march 2022   IMPRESSIONS    1. Left ventricular ejection fraction, by estimation, is 60 to 65%. The  left ventricle has normal function. The left ventricle has no regional  wall motion abnormalities. There is mild left ventricular hypertrophy.  Left ventricular diastolic parameters  are consistent with Grade II diastolic dysfunction (pseudonormalization).  2. Right ventricular systolic function is normal. The right ventricular  size is normal. There is normal pulmonary artery systolic pressure. The  estimated right ventricular systolic pressure is 22.9 mmHg.  3. Left atrial size was mildly dilated.  4. The mitral valve is normal in structure. Trivial mitral valve  regurgitation. No evidence of mitral stenosis.  5. The aortic valve is tricuspid. Aortic valve regurgitation is not  visualized. Mild aortic valve stenosis. Aortic valve area, by VTI measures  1.71 cm. Aortic valve mean gradient measures 10.0 mmHg.  6. The inferior vena cava is normal in size with greater than 50%  respiratory variability, suggesting right atrial pressure of 3 mmHg.    has a past medical history of Arthritis, CAD (coronary artery disease), Concussion, Depression, DM (diabetes mellitus) ([place]), Dyspnea, Dysrhythmia, ED (erectile dysfunction), Fracture (2010), GERD (gastroesophageal reflux  disease), HTN (hypertension), Hyperlipidemia, IBS (irritable bowel syndrome), Palpitations, Peripheral vascular disease ([place]), Pneumonia, Prostate cancer ([place]) (2007), Sigmoid diverticulosis, and Wegener's granulomatosis.   reports that he quit smoking about 29 years ago. His smoking use included cigarettes. He started smoking about 60 years ago. He has a 77.50 pack-year smoking history. He has never used smokeless tobacco.  Past Surgical History:  Procedure Laterality Date  . CARDIAC CATHETERIZATION    . COLONOSCOPY N/A 06/24/2016   Procedure: COLONOSCOPY with propofol;  Surgeon: Garlan Fair, MD;  Location: [facility];  Service: Endoscopy;  Laterality: N/A;  . colonscopy  2008  . CORONARY STENT INTERVENTION N/A 09/20/2016   Procedure: Coronary Stent Intervention;  Surgeon: Leonie Man, MD;  Location: [place] [facility];  Service: Cardiovascular;  Laterality: N/A;  . CORONARY STENT INTERVENTION N/A 05/23/2017   Procedure: CORONARY STENT INTERVENTION;  Surgeon: Burnell Blanks, MD;  Location: [place] [facility];  Service: Cardiovascular;  Laterality: N/A;  . INSERTION PROSTATE RADIATION SEED  2007  . INTRAVASCULAR PRESSURE WIRE/FFR STUDY N/A 09/20/2016   Procedure: Intravascular Pressure Wire/FFR Study;  Surgeon: Leonie Man, MD;  Location: [place] [facility];  Service: Cardiovascular;  Laterality: N/A;  . LEFT HEART CATH AND CORONARY ANGIOGRAPHY N/A 09/20/2016   Procedure: Left Heart Cath and Coronary Angiography;  Surgeon: Leonie Man, MD;  Location: [place] [facility];  Service: Cardiovascular;  Laterality: N/A;  . LEFT HEART CATH AND CORONARY ANGIOGRAPHY N/A 05/23/2017   Procedure: LEFT HEART CATH AND CORONARY ANGIOGRAPHY;  Surgeon: Burnell Blanks, MD;  Location: [place] [facility];  Service: Cardiovascular;  Laterality: N/A;  . PARATHYROIDECTOMY Right 02/26/2018   Procedure: RIGHT INFERIOR PARATHYROIDECTOMY;  Surgeon: Armandina Gemma, MD;  Location: [facility];   Service: General;  Laterality: Right;  . ROTATOR CUFF REPAIR Right     Allergies  Allergen Reactions  . Codeine Other (See Comments)    "jittery"  . Lipitor [Atorvastatin] Other (See Comments)    arthralgia  . Zetia [Ezetimibe] Other (See Comments)    arthralgia  . Zocor [Simvastatin] Other (See Comments)    arthralgia    Immunization History  Administered Date(s) Administered  . Influenza Split 01/24/2009, 01/19/2012, 01/18/2013  . Influenza, High Dose Seasonal PF 03/09/2015, 01/06/2017, 01/22/2017, 01/08/2018, 01/20/2019, 03/01/2020  . Influenza,inj,Quad PF,6+ Mos 01/18/2011  . PFIZER(Purple Top)SARS-COV-2 Vaccination 12/09/2019, 12/31/2019  . Pneumococcal Conjugate-13 03/09/2015  . Pneumococcal Polysaccharide-23 02/19/2012, 01/08/2018  . Td 04/09/1999  . Tdap 05/03/2020  . Zoster 03/01/2020, 05/03/2020    Family History  Problem Relation Age of Onset  . Heart attack Father 19  . CAD Father   . Diabetic kidney disease Mother   . Healthy Sister   . Cancer Brother   . Healthy Sister      Current Outpatient Medications:  .  acetaminophen (TYLENOL) 325 MG tablet, Take 650 mg by mouth every morning., Disp: , Rfl:  .  amLODipine (NORVASC) 5 MG tablet, Take 1 tablet (5 mg total) by mouth daily., Disp: 30 tablet, Rfl: 1 .  aspirin EC 81 MG tablet, Take 81 mg by mouth at bedtime., Disp: , Rfl:  .  buPROPion (ZYBAN) 150 MG 12 hr tablet, Take 150 mg by mouth every morning., Disp: , Rfl: 5 .  clopidogrel (PLAVIX) 75 MG tablet, Take 1 tablet (75 mg total) by mouth daily., Disp: 30 tablet, Rfl: 1 .  escitalopram (LEXAPRO) 10 MG tablet, Take 10 mg by mouth daily., Disp: , Rfl:  .  glimepiride (AMARYL) 1 MG tablet, Take 1 mg by mouth daily with breakfast., Disp: , Rfl:  .  ipratropium-albuterol (DUONEB) 0.5-2.5 (3) MG/3ML SOLN, Take 3 mLs by nebulization every 6 (six) hours as needed (wheezing or shortness of breath)., Disp: , Rfl:  .  isosorbide mononitrate (IMDUR) 30 MG 24 hr  tablet, Take 1 tablet (30 mg total) by mouth daily., Disp: 30 tablet, Rfl: 1 .  Multiple Vitamin (MULTIVITAMIN WITH MINERALS) TABS tablet, Take 1 tablet by mouth daily., Disp: , Rfl:  .  nitroGLYCERIN (NITROSTAT) 0.4 MG SL tablet, Place 1 tablet (0.4 mg total) under the tongue every 5 (five) minutes x 3 doses as needed for chest pain., Disp: 25 tablet, Rfl: 1 .  pantoprazole (PROTONIX) 40 MG tablet, Take 1 tablet (40 mg total) by mouth 2 (two) times daily., Disp: 60 tablet, Rfl: 2 .  rosuvastatin (CRESTOR) 5 MG tablet, Take 10 mg by mouth daily. , Disp: , Rfl:  .  sitaGLIPtin (JANUVIA) 100 MG tablet, Take 100 mg by mouth daily. , Disp: , Rfl:  .  SYMBICORT 160-4.5 MCG/ACT inhaler, INHALE 2 PUFFS INTO THE LUNGS TWICE DAILY, Disp: 10.2 g, Rfl: 5 .  traMADol (ULTRAM) 50 MG tablet, Take 1-2 tablets (50-100 mg total) by mouth every 6 (six) hours as needed., Disp: 15 tablet, Rfl: 0      Objective:   Vitals:   06/28/20 1019  BP: (!) 120/58  Pulse: (!) 56  Temp: (!) 97.3 F (36.3 C)  TempSrc: Temporal  SpO2: 99%  Weight:  170 lb (77.1 kg)  Height: 5\' 8"  (1.727 m)    Estimated body mass index is 25.85 kg/m as calculated from the following:   Height as of this encounter: 5\' 8"  (1.727 m).   Weight as of this encounter: 170 lb (77.1 kg).  @WEIGHTCHANGE @  [employer]   06/28/20 1019  Weight: 170 lb (77.1 kg)     Physical Exam   General: No distress. Looks well Neuro: Alert and Oriented x 3. GCS 15. Speech normal Psych: Pleasant Resp:  Barrel Chest - no.  Wheeze - no, Crackles - no, No overt respiratory distress CVS: Normal heart sounds. Murmurs - no Ext: Stigmata of Connective Tissue Disease - no HEENT: Normal upper airway. PEERL +. No post nasal drip        Assessment:       ICD-10-CM   1. Dyspnea on exertion  R06.00 Nitric oxide  2. Grade II diastolic dysfunction  Y77.41   3. History of granulomatosis with polyangiitis  Z86.79 Nitric oxide  4. Nodule of left lung   R91.1        Plan:     Patient Instructions  History of Wegener's granulomatosis none Nodule of left lung - last seen dec 2019 -> stable march 2022 Hx of CAD Dyspnea on exertion - grade 2 diastolic dysfunction Mild air trapping on symbicort  -Shortness of breath mostl likely due to stiff heart muscle aka diastolic dysfunction - no evidence of active asthma while on symbicort   Plan  - make appt with Dr Candee Furbish to re-establish  - refer pulmonarry rehab for dyspnea and diastolic dysfunction  - stop symbicort and take arnuity once daily one puff - repeat CT chest in 2 years for followup lung nodule  Follow-up -Return to see me Dr. Chase Hudson 6-9 months or sooner if needed - repeat      SIGNATURE    Dr. Brand Males, M.D., F.C.C.P,  Pulmonary and Critical Care Medicine Staff Physician, [place] Director - Interstitial Lung Disease  Program  Pulmonary [place] [facility] [place], [place], [address]  Pager: [phone], If no answer or between  15:00h - 7:00h: call [phone] Telephone: [phone]  10:42 AM 06/28/2020

## 2020-06-28 NOTE — Patient Instructions (Addendum)
History of Wegener's granulomatosis none Nodule of left lung - last seen dec 2019 -> stable march 2022 Hx of CAD Dyspnea on exertion - grade 2 diastolic dysfunction Mild air trapping on symbicort  -Shortness of breath mostl likely due to stiff heart muscle aka diastolic dysfunction - no evidence of active asthma while on symbicort   Plan  - make appt with Dr Candee Furbish to re-establish  - refer pulmonarry rehab for dyspnea and diastolic dysfunction  - stop symbicort and take arnuity once daily one puff - repeat CT chest in 2 years for followup lung nodule  Follow-up -Return to see me [name] 6-9 months or sooner if needed - repeat

## 2020-06-28 NOTE — Patient Instructions (Signed)
Full PFT performed today. °

## 2020-06-28 NOTE — Progress Notes (Signed)
Full PFT performed today. °

## 2020-06-29 ENCOUNTER — Encounter (HOSPITAL_COMMUNITY): Payer: Self-pay | Admitting: *Deleted

## 2020-06-29 NOTE — Progress Notes (Signed)
Received referral from [name] for this pt to participate in pulmonary rehab with the the diagnosis of Dyspnea on Exertion. Pt is known to staff from his previous participation in cardiac rehab in 2019. Clinical review of pt follow up appt on 3/23 Pulmonary office note.  Pt with Covid Risk Score - 6. Pt is to reestablish care with cardiology and he has an upcoming appt on 4/6.  Pt appropriate for scheduling for Pulmonary rehab to begin after his cardiology follow up is completed and plan of care if needed has been established. Would like to rule out any cardiac reasons for his continued shortness of breath with his history of CAD.  Will forward to support staff for scheduling and verification of insurance eligibility/benefits with pt consent. Cherre Huger, BSN Cardiac and Training and development officer

## 2020-07-05 DIAGNOSIS — M25511 Pain in right shoulder: Secondary | ICD-10-CM | POA: Diagnosis not present

## 2020-07-06 DIAGNOSIS — E1165 Type 2 diabetes mellitus with hyperglycemia: Secondary | ICD-10-CM | POA: Diagnosis not present

## 2020-07-12 ENCOUNTER — Encounter: Payer: Self-pay | Admitting: Physician Assistant

## 2020-07-12 ENCOUNTER — Ambulatory Visit: Payer: Medicare Other | Admitting: Physician Assistant

## 2020-07-12 ENCOUNTER — Other Ambulatory Visit: Payer: Self-pay

## 2020-07-12 VITALS — BP 108/52 | HR 62 | Ht 68.0 in | Wt 170.6 lb

## 2020-07-12 DIAGNOSIS — I10 Essential (primary) hypertension: Secondary | ICD-10-CM

## 2020-07-12 DIAGNOSIS — E782 Mixed hyperlipidemia: Secondary | ICD-10-CM

## 2020-07-12 DIAGNOSIS — E119 Type 2 diabetes mellitus without complications: Secondary | ICD-10-CM | POA: Diagnosis not present

## 2020-07-12 DIAGNOSIS — R0602 Shortness of breath: Secondary | ICD-10-CM

## 2020-07-12 DIAGNOSIS — I5189 Other ill-defined heart diseases: Secondary | ICD-10-CM

## 2020-07-12 DIAGNOSIS — R072 Precordial pain: Secondary | ICD-10-CM

## 2020-07-12 DIAGNOSIS — M3130 Wegener's granulomatosis without renal involvement: Secondary | ICD-10-CM

## 2020-07-12 DIAGNOSIS — I25119 Atherosclerotic heart disease of native coronary artery with unspecified angina pectoris: Secondary | ICD-10-CM

## 2020-07-12 NOTE — Progress Notes (Addendum)
Cardiology Office Note:    Date:  07/12/2020   ID:  Brian Hudson, DOB 02-Apr-1951, MRN [id number]  PCP:  Brian Hudson, [place]  Cardiologist:  Brian Furbish, MD  Electrophysiologist:  None       Referring MD: Brian Huddle, MD   Chief Complaint:  No chief complaint on file.    Patient Profile:     Brian Hudson is a 70 y.o. male with:   Coronary artery disease   S/p DES to OM1 in 6/18  S/p DES to OM1 beyond old stent in 2/19  Cath 2/19: non-obs dz in LAD, severe dz in small caliber RI (med Rx), OM1 stent patent w severe dz beyond stent (PCI), mild non-obs dz in RCA  Hypertension   Hyperlipidemia    Diabetes mellitus   GERD  Prostate CA  Wegener's granulomatosis  Prior CV studies: ECHOCARDIOGRAM 06/12/2020 EF 60-65, no RWMA, mild LVH, Gr 2 DD, normal RVSF, RVSP 31.7, mild LAE, trivial MR, mild aortic stenosis (mean gradient 10 mmHg, Vmax 1.74 cm, DI 0.54)  LEFT HEART CATH 05/23/2017 LAD ostial 30, distal 50; D1 ostial 50 LCx ostial 30, proximal stent patent, mid 40; RI 90; OM1 stent patent then 80, 50 RCA mid 20; RPAV 50 1. Non-obstructive diffuse disease in the LAD 2. Severe stenosis very small caliber intermediate branch. Unchanged from last cath and too small for PCI 3. Severe stenosis OM1 just beyond old stent. Successful PTCA/DES x 1 OM1. 4. Non-obstructive disease in the large dominant RCA 5. Normal LV systolic function.     GATED SPECT MYO PERF W/EXERCISE STRESS 1D 09/19/2016 Narrative  Markedly positive ECG with 2 mm horizontal ST segment depression in inferior lateral leads Normal perfusion images with no ischemia or infarction EF 53%     History of Present Illness:    Brian Hudson was last seen by Brian Hudson in 12/2018.  He was recently seen by Brian Hudson with pulmonology for follow-up on dyspnea exertion and Wegener's granulomatosis.  His PFTs are normal.  An echocardiogram was obtained and demonstrated normal EF  and moderate diastolic dysfunction.  He was started in pulmonary rehabilitation.  It is felt that his shortness of breath is likely related to diastolic dysfunction.  He was asked to follow-up with cardiology.  He returns for f/u.    He is here today with his sister.  He notes chronic shortness of breath.  He gets short of breath with some activities.  This has been ongoing for at least a year.  He has not had orthopnea, paroxysmal nocturnal dyspnea.  He has not had significant lower extremity swelling.  He has had a couple of episodes of chest discomfort over the past several weeks.  He took nitroglycerin on one occasion with relief.  He has not really had exertional chest discomfort.  He was working in the yard a few weeks ago and became short of breath and diaphoretic.  He threw up after eating.  He never did check his sugar.  His symptoms sound somewhat suspicious for hypoglycemia.  He has not had syncope.    Past Medical History:  Diagnosis Date  . Arthritis    osteoarthritis   . CAD (coronary artery disease)    a. 09/20/16: 45% OM, 99% ostial ramus s/p DES, 80% proximal LCx s/p DES, 80% more distal LCx on AV groove portion of the circumflex after OM1 s/p cutting balloon and PCTA.   Marland Kitchen Concussion   .  Depression   . DM (diabetes mellitus) ([place])    type 2  . Dyspnea    with exertion   . Dysrhythmia    hx of atrial fib- 15-20 years ago   . ED (erectile dysfunction)   . Fracture 2010   L 3 and L 4 healed with brace  . GERD (gastroesophageal reflux disease)   . HTN (hypertension)   . Hyperlipidemia   . IBS (irritable bowel syndrome)   . Palpitations    none in last few yrs  . Peripheral vascular disease (HCC)    legs   . Pneumonia    hx of   . Prostate cancer ([place]) 2007  . Sigmoid diverticulosis   . Wegener's granulomatosis    followed by Brian Hudson    Current Medications: Current Meds  Medication Sig  . acetaminophen (TYLENOL) 325 MG tablet Take 650 mg by mouth every  morning.  Marland Kitchen amLODipine (NORVASC) 5 MG tablet Take 1 tablet (5 mg total) by mouth daily.  Marland Kitchen aspirin EC 81 MG tablet Take 81 mg by mouth at bedtime.  Marland Kitchen buPROPion (ZYBAN) 150 MG 12 hr tablet Take 150 mg by mouth every morning.  . clopidogrel (PLAVIX) 75 MG tablet Take 1 tablet (75 mg total) by mouth daily.  Marland Kitchen escitalopram (LEXAPRO) 10 MG tablet Take 10 mg by mouth daily.  . Fluticasone Furoate (ARNUITY ELLIPTA) 100 MCG/ACT AEPB Inhale 1 puff into the lungs daily.  Marland Kitchen glimepiride (AMARYL) 1 MG tablet Take 1 mg by mouth daily with breakfast.  . ipratropium-albuterol (DUONEB) 0.5-2.5 (3) MG/3ML SOLN Take 3 mLs by nebulization every 6 (six) hours as needed (wheezing or shortness of breath).  . isosorbide mononitrate (IMDUR) 30 MG 24 hr tablet Take 1 tablet (30 mg total) by mouth daily.  . Multiple Vitamin (MULTIVITAMIN WITH MINERALS) TABS tablet Take 1 tablet by mouth daily.  . nitroGLYCERIN (NITROSTAT) 0.4 MG SL tablet Place 1 tablet (0.4 mg total) under the tongue every 5 (five) minutes x 3 doses as needed for chest pain.  . pantoprazole (PROTONIX) 40 MG tablet Take 1 tablet (40 mg total) by mouth 2 (two) times daily.  . rosuvastatin (CRESTOR) 5 MG tablet Take 10 mg by mouth daily.   . sitaGLIPtin (JANUVIA) 100 MG tablet Take 100 mg by mouth daily.      Allergies:   Codeine, Lipitor [atorvastatin], Zetia [ezetimibe], and Zocor [simvastatin]   Social History   Tobacco Use  . Smoking status: Former Smoker    Packs/day: 2.50    Years: 31.00    Pack years: 77.50    Types: Cigarettes    Start date: 1962    Quit date: 04/09/1991    Years since quitting: 29.2  . Smokeless tobacco: Never Used  Vaping Use  . Vaping Use: Never used  Substance Use Topics  . Alcohol use: No    Alcohol/week: 0.0 standard drinks  . Drug use: Never    Comment:   marijuana last used 2- weeks ago as of 06-20-16     Family Hx: The patient's family history includes CAD in his father; Cancer in his brother; Diabetic  kidney disease in his mother; Healthy in his sister and sister; Heart attack (age of onset: 27) in his father.  ROS   EKGs/Labs/Other Test Reviewed:    EKG:  EKG is  ordered today.  The ekg ordered today demonstrates NSR, HR 62, normal axis, no ST-T wave changes, no change since 12/09/2018  Recent Labs:  Recent Lipid Panel Lab Results  Component Value Date/Time   CHOL 122 10/02/2018 08:45 AM   TRIG 92 10/02/2018 08:45 AM   HDL 35 (L) 10/02/2018 08:45 AM   CHOLHDL 3.5 10/02/2018 08:45 AM   LDLCALC 69 10/02/2018 08:45 AM      Risk Assessment/Calculations:      Physical Exam:    VS:  BP (!) 108/52   Pulse 62   Ht 5\' 8"  (1.727 m)   Wt 170 lb 9.6 oz (77.4 kg)   SpO2 98%   BMI 25.94 kg/m     Wt Readings from Last 3 Encounters:  07/12/20 170 lb 9.6 oz (77.4 kg)  06/28/20 170 lb (77.1 kg)  05/12/20 170 lb (77.1 kg)     Constitutional:      Appearance: Healthy appearance. Not in distress.  Neck:     Vascular: No JVR. JVD normal.  Pulmonary:     Effort: Pulmonary effort is normal.     Breath sounds: No wheezing. No rales.  Cardiovascular:     Normal rate. Regular rhythm. Normal S1. Normal S2.     Murmurs: There is no murmur.     No gallop.  Edema:    Peripheral edema absent.  Abdominal:     Palpations: Abdomen is soft. There is no hepatomegaly.  Skin:    General: Skin is warm and dry.  Neurological:     General: No focal deficit present.     Mental Status: Alert and oriented to person, place and time.     Cranial Nerves: Cranial nerves are intact.         ASSESSMENT & PLAN:    1. Coronary artery disease involving native heart with angina pectoris, unspecified vessel or lesion type [facility]) History of prior stenting to the OM1 in 6/18 and repeat stenting to the OM1 beyond the old stent in 2/19.  He has noted severe disease in a small caliber RI which is managed medically and nonobstructive disease in LAD and RCA.  Recent echocardiogram demonstrated normal LV  function.  He has had a couple episodes of angina.  He did take nitroglycerin at least one occasion with relief.  He does not have any changes on his electrocardiogram.  He does have residual disease which could contribute to anginal symptoms.  I have recommended proceeding with an exercise Myoview to rule out significant ischemia.  Continue current dose of aspirin, clopidogrel, amlodipine, isosorbide, rosuvastatin.  Follow-up with Brian Hudson in 4-6 weeks  2. Diastolic dysfunction 3. Shortness of breath He had moderate diastolic dysfunction on recent echocardiogram.  He does not have any evidence of volume excess on exam.  I will obtain a BMET, BNP today.  If his BNP is significantly better, I will place him on low-dose diuretic therapy.  4. Essential hypertension The patient's blood pressure is controlled on his current regimen.  Continue current therapy.   5. Mixed hyperlipidemia Continue low-dose statin therapy.  His lipids in 02/2020 or above goal.  LDL was 121.  If his LDL remains above 70, consider adding ezetimibe.  6. Diabetes mellitus with coincident hypertension ([place]) He had a recent episode that sound like hypoglycemia.  Continue follow-up with primary care.  7. Wegener's granulomatosis without renal involvement ([place]) Continue follow-up with pulmonology.   Shared Decision Making/Informed Consent The risks [chest pain, shortness of breath, cardiac arrhythmias, dizziness, blood pressure fluctuations, myocardial infarction, stroke/transient ischemic attack, nausea, vomiting, allergic reaction, radiation exposure, metallic taste sensation and life-threatening complications (estimated to be  1 in 10,000)], benefits (risk stratification, diagnosing coronary artery disease, treatment guidance) and alternatives of a nuclear stress test were discussed in detail with Mr. Joos and he agrees to proceed.       Dispo:  Return in about 6 weeks (around 08/23/2020) for Routine Follow Up with Brian.  Marlou Hudson.   Medication Adjustments/Labs and Tests Ordered: Current medicines are reviewed at length with the patient today.  Concerns regarding medicines are outlined above.  Tests Ordered: Orders Placed This Encounter  Procedures  . Basic metabolic panel  . Pro b natriuretic peptide (BNP)  . Cardiac Stress Test: Informed Consent Details: Physician/Practitioner Attestation; Transcribe to consent form and obtain patient signature  . MYOCARDIAL PERFUSION IMAGING  . EKG 12-Lead   Medication Changes: No orders of the defined types were placed in this encounter.   Signed, Richardson Dopp, PA-C  07/12/2020 5:39 PM    [facility] [address], [place], [place]  [address] Phone: [phone]; Fax: [phone]

## 2020-07-12 NOTE — Patient Instructions (Addendum)
Medication Instructions:  Your physician recommends that you continue on your current medications as directed. Please refer to the Current Medication list given to you today.  *If you need a refill on your cardiac medications before your next appointment, please call your pharmacy*   Lab Work: TODAY:  BMET & PRO BNP  If you have labs (blood work) drawn today and your tests are completely normal, you will receive your results only by: Marland Kitchen MyChart Message (if you have MyChart) OR . A paper copy in the mail If you have any lab test that is abnormal or we need to change your treatment, we will call you to review the results.   Testing/Procedures: Your physician has requested that you have a lexiscan myoview. For further information please visit [email or web address]. Please follow instruction sheet BELOW:   You are scheduled for a Myocardial Perfusion Imaging Study Please arrive 15 minutes prior to your appointment time for registration and insurance purposes.  The test will take approximately 3 to 4 hours to complete; you may bring reading material.  If someone comes with you to your appointment, they will need to remain in the main lobby due to limited space in the testing area. **If you are pregnant or breastfeeding, please notify the nuclear lab prior to your appointment**  How to prepare for your Myocardial Perfusion Test: . Do not eat or drink 3 hours prior to your test, except you may have water. . Do not consume products containing caffeine (regular or decaffeinated) 12 hours prior to your test. (ex: coffee, chocolate, sodas, tea). . Do bring a list of your current medications with you.  If not listed below, you may take your medications as normal.  . Do not take any diabetic medications until after your procedure  . Do wear comfortable clothes (no dresses or overalls) and walking shoes, tennis shoes preferred (No heels or open toe shoes are allowed). . Do NOT wear cologne, perfume,  aftershave, or lotions (deodorant is allowed). . If these instructions are not followed, your test will have to be rescheduled.    Follow-Up: At [facility], you and your health needs are our priority.  As part of our continuing mission to provide you with exceptional heart care, we have created designated Provider Care Teams.  These Care Teams include your primary Cardiologist (physician) and Advanced Practice Providers (APPs -  Physician Assistants and Nurse Practitioners) who all work together to provide you with the care you need, when you need it.  We recommend signing up for the patient portal called "MyChart".  Sign up information is provided on this After Visit Summary.  MyChart is used to connect with patients for Virtual Visits (Telemedicine).  Patients are able to view lab/test results, encounter notes, upcoming appointments, etc.  Non-urgent messages can be sent to your provider as well.   To learn more about what you can do with MyChart, go to [email or web address].    Your next appointment:   4-6 [facility]  The format for your next appointment:   In Person  Provider:   You may see Candee Furbish, MD or one of the following Advanced Practice Providers on your designated Care Team:    Richardson Dopp, [place]    Other Instructions

## 2020-07-13 ENCOUNTER — Telehealth: Payer: Self-pay

## 2020-07-13 DIAGNOSIS — E860 Dehydration: Secondary | ICD-10-CM

## 2020-07-13 DIAGNOSIS — I5189 Other ill-defined heart diseases: Secondary | ICD-10-CM

## 2020-07-13 DIAGNOSIS — Z79899 Other long term (current) drug therapy: Secondary | ICD-10-CM

## 2020-07-13 LAB — BASIC METABOLIC PANEL
BUN/Creatinine Ratio: 9 — ABNORMAL LOW (ref 10–24)
BUN: 14 mg/dL (ref 8–27)
CO2: 20 mmol/L (ref 20–29)
Calcium: 9.3 mg/dL (ref 8.6–10.2)
Chloride: 102 mmol/L (ref 96–106)
Creatinine, Ser: 1.58 mg/dL — ABNORMAL HIGH (ref 0.76–1.27)
Glucose: 126 mg/dL — ABNORMAL HIGH (ref 65–99)
Potassium: 4.3 mmol/L (ref 3.5–5.2)
Sodium: 137 mmol/L (ref 134–144)
eGFR: 46 mL/min/{1.73_m2} — ABNORMAL LOW (ref >59)

## 2020-07-13 LAB — PRO B NATRIURETIC PEPTIDE: NT-Pro BNP: 105 pg/mL (ref 0–376)

## 2020-07-13 NOTE — Telephone Encounter (Signed)
Pt verbalized understanding if his lab results and will have his repeat BMET when he comes in for his stress test 07/21/20.

## 2020-07-13 NOTE — Telephone Encounter (Signed)
-----   Message from Liliane Shi, PA-C sent at 07/13/2020  9:45 AM EDT ----- BNP normal.  Creatinine elevated.  K+ are normal. Elevated creatinine may indicate dehydration.  Normal BNP indicates no sign of excess fluid contributing to shortness of breath. PLAN:  -Patient should push fluids (water). -Repeat BMET in 1 week -Send copy to PCP Richardson Dopp, PA-C    07/13/2020 9:38 AM

## 2020-07-19 ENCOUNTER — Telehealth (HOSPITAL_COMMUNITY): Payer: Self-pay | Admitting: *Deleted

## 2020-07-19 NOTE — Telephone Encounter (Signed)
Left message on voicemail per DPR in reference to upcoming appointment scheduled on 07/21/20 at 7:30 with detailed instructions given per Myocardial Perfusion Study Information Sheet for the test. LM to arrive 15 minutes early, and that it is imperative to arrive on time for appointment to keep from having the test rescheduled. If you need to cancel or reschedule your appointment, please call the office within 24 hours of your appointment. Failure to do so may result in a cancellation of your appointment, and a $50 no show fee. Phone number given for call back for any questions.

## 2020-07-20 DIAGNOSIS — M25511 Pain in right shoulder: Secondary | ICD-10-CM | POA: Diagnosis not present

## 2020-07-21 ENCOUNTER — Ambulatory Visit (HOSPITAL_COMMUNITY): Payer: Medicare Other | Attending: Internal Medicine

## 2020-07-21 ENCOUNTER — Other Ambulatory Visit: Payer: Self-pay

## 2020-07-21 ENCOUNTER — Other Ambulatory Visit: Payer: Medicare Other | Admitting: *Deleted

## 2020-07-21 DIAGNOSIS — I25119 Atherosclerotic heart disease of native coronary artery with unspecified angina pectoris: Secondary | ICD-10-CM | POA: Diagnosis not present

## 2020-07-21 DIAGNOSIS — R0602 Shortness of breath: Secondary | ICD-10-CM | POA: Diagnosis not present

## 2020-07-21 DIAGNOSIS — I5189 Other ill-defined heart diseases: Secondary | ICD-10-CM

## 2020-07-21 DIAGNOSIS — E860 Dehydration: Secondary | ICD-10-CM

## 2020-07-21 DIAGNOSIS — R072 Precordial pain: Secondary | ICD-10-CM | POA: Diagnosis not present

## 2020-07-21 DIAGNOSIS — Z79899 Other long term (current) drug therapy: Secondary | ICD-10-CM

## 2020-07-21 LAB — MYOCARDIAL PERFUSION IMAGING
Estimated workload: 10.7 METS
Exercise duration (min): 9 min
Exercise duration (sec): 20 s
LV dias vol: 93 mL (ref 62–150)
LV sys vol: 40 mL
MPHR: 149 {beats}/min
Peak HR: 131 {beats}/min
Percent HR: 87 %
Rest HR: 68 {beats}/min
SDS: 1
SRS: 0
SSS: 1
TID: 1.07

## 2020-07-21 LAB — BASIC METABOLIC PANEL
BUN/Creatinine Ratio: 11 (ref 10–24)
BUN: 15 mg/dL (ref 8–27)
CO2: 21 mmol/L (ref 20–29)
Calcium: 9.3 mg/dL (ref 8.6–10.2)
Chloride: 103 mmol/L (ref 96–106)
Creatinine, Ser: 1.38 mg/dL — ABNORMAL HIGH (ref 0.76–1.27)
Glucose: 144 mg/dL — ABNORMAL HIGH (ref 65–99)
Potassium: 4.4 mmol/L (ref 3.5–5.2)
Sodium: 140 mmol/L (ref 134–144)
eGFR: 55 mL/min/{1.73_m2} — ABNORMAL LOW (ref >59)

## 2020-07-21 MED ORDER — TECHNETIUM TC 99M TETROFOSMIN IV KIT
30.9000 | PACK | Freq: Once | INTRAVENOUS | Status: AC | PRN
  Administered 2020-07-21: 30.9 via INTRAVENOUS
  Filled 2020-07-21: qty 31

## 2020-07-21 MED ORDER — TECHNETIUM TC 99M TETROFOSMIN IV KIT
10.1000 | PACK | Freq: Once | INTRAVENOUS | Status: AC | PRN
  Administered 2020-07-21: 10.1 via INTRAVENOUS
  Filled 2020-07-21: qty 11

## 2020-07-23 ENCOUNTER — Encounter: Payer: Self-pay | Admitting: Physician Assistant

## 2020-07-28 DIAGNOSIS — M25511 Pain in right shoulder: Secondary | ICD-10-CM | POA: Diagnosis not present

## 2020-08-04 DIAGNOSIS — E1165 Type 2 diabetes mellitus with hyperglycemia: Secondary | ICD-10-CM | POA: Diagnosis not present

## 2020-08-06 ENCOUNTER — Other Ambulatory Visit: Payer: Self-pay | Admitting: Cardiology

## 2020-08-09 ENCOUNTER — Other Ambulatory Visit: Payer: Self-pay

## 2020-08-09 ENCOUNTER — Ambulatory Visit: Payer: Medicare Other | Admitting: Cardiology

## 2020-08-09 ENCOUNTER — Encounter: Payer: Self-pay | Admitting: Cardiology

## 2020-08-09 VITALS — BP 112/60 | HR 58 | Ht 68.0 in | Wt 173.0 lb

## 2020-08-09 DIAGNOSIS — I5189 Other ill-defined heart diseases: Secondary | ICD-10-CM

## 2020-08-09 DIAGNOSIS — I25119 Atherosclerotic heart disease of native coronary artery with unspecified angina pectoris: Secondary | ICD-10-CM

## 2020-08-09 NOTE — Patient Instructions (Signed)
Medication Instructions:  The current medical regimen is effective;  continue present plan and medications.  *If you need a refill on your cardiac medications before your next appointment, please call your pharmacy*  Follow-Up: At [facility], you and your health needs are our priority.  As part of our continuing mission to provide you with exceptional heart care, we have created designated Provider Care Teams.  These Care Teams include your primary Cardiologist (physician) and Advanced Practice Providers (APPs -  Physician Assistants and Nurse Practitioners) who all work together to provide you with the care you need, when you need it.  We recommend signing up for the patient portal called "MyChart".  Sign up information is provided on this After Visit Summary.  MyChart is used to connect with patients for Virtual Visits (Telemedicine).  Patients are able to view lab/test results, encounter notes, upcoming appointments, etc.  Non-urgent messages can be sent to your provider as well.   To learn more about what you can do with MyChart, go to [email or web address].    Your next appointment:   12 month(s)  The format for your next appointment:   In Person  Provider:   Mark Skains, MD   Thank you for choosing [facility]!!      

## 2020-08-09 NOTE — Progress Notes (Signed)
Cardiology Office Note:    Date:  08/09/2020   ID:  Brian Hudson, DOB [birth year], MRN [id number]  PCP:  Josetta Huddle, MD   [facility] Providers Cardiologist:  Candee Furbish, MD     Referring MD: Josetta Huddle, MD    History of Present Illness:    Brian Hudson is a 70 y.o. male here for follow up of shortness of breath and coronary artery disease. Last seen by Richardson Dopp who ordered a stress test described below. Brian Hudson is accompanied by Brian Hudson sister today. Brian Hudson at home morning systolic blood pressure ranges from 130-140 however in the evening it decreases. Brian Hudson denies having any negative symptoms when these episodes occur.  Although when Brian Hudson glucose levels are lower than Brian Hudson baseline Brian Hudson does feel fatigue. Brian Hudson has been walking more frequently, but does not participate in formal exercise.Brian Hudson does not have any exertional shortness of breath, chest pain, tightness, or pressure.  Brian Hudson denies having any syncopal episodes, lightheadedness, PND or orthopnea.   Past Medical History:  Diagnosis Date  . Arthritis    osteoarthritis   . CAD (coronary artery disease)    a. 09/20/16: 45% OM, 99% ostial ramus s/p DES, 80% proximal LCx s/p DES, 80% more distal LCx on AV groove portion of the circumflex after OM1 s/p cutting balloon and PCTA. // Ex Myoview 4/22: 9'20", 10.7 METs, BP 236/83 at pk stress, 3 mm ST depression inf-lat, no symptoms / EF 57, no ischemia or infarction; low risk    . Concussion   . Depression   . DM (diabetes mellitus) ([place])    type 2  . Dyspnea    with exertion   . Dysrhythmia    hx of atrial fib- 15-20 years ago   . ED (erectile dysfunction)   . Fracture 2010   L 3 and L 4 healed with brace  . GERD (gastroesophageal reflux disease)   . HTN (hypertension)   . Hyperlipidemia   . IBS (irritable bowel syndrome)   . Palpitations    none in last few yrs  . Peripheral vascular disease (HCC)    legs   . Pneumonia    hx of   . Prostate cancer ([place]) 2007  . Sigmoid diverticulosis    . Wegener's granulomatosis    followed by dr Berna Bue    Past Surgical History:  Procedure Laterality Date  . CARDIAC CATHETERIZATION    . COLONOSCOPY N/A 06/24/2016   Procedure: COLONOSCOPY with propofol;  Surgeon: Garlan Fair, MD;  Location: [facility];  Service: Endoscopy;  Laterality: N/A;  . colonscopy  2008  . CORONARY STENT INTERVENTION N/A 09/20/2016   Procedure: Coronary Stent Intervention;  Surgeon: Leonie Man, MD;  Location: [place] [facility];  Service: Cardiovascular;  Laterality: N/A;  . CORONARY STENT INTERVENTION N/A 05/23/2017   Procedure: CORONARY STENT INTERVENTION;  Surgeon: Burnell Blanks, MD;  Location: [place] [facility];  Service: Cardiovascular;  Laterality: N/A;  . INSERTION PROSTATE RADIATION SEED  2007  . INTRAVASCULAR PRESSURE WIRE/FFR STUDY N/A 09/20/2016   Procedure: Intravascular Pressure Wire/FFR Study;  Surgeon: Leonie Man, MD;  Location: [place] [facility];  Service: Cardiovascular;  Laterality: N/A;  . LEFT HEART CATH AND CORONARY ANGIOGRAPHY N/A 09/20/2016   Procedure: Left Heart Cath and Coronary Angiography;  Surgeon: Leonie Man, MD;  Location: [place] [facility];  Service: Cardiovascular;  Laterality: N/A;  . LEFT HEART CATH AND CORONARY ANGIOGRAPHY N/A 05/23/2017   Procedure:  LEFT HEART CATH AND CORONARY ANGIOGRAPHY;  Surgeon: Burnell Blanks, MD;  Location: [place] [facility];  Service: Cardiovascular;  Laterality: N/A;  . PARATHYROIDECTOMY Right 02/26/2018   Procedure: RIGHT INFERIOR PARATHYROIDECTOMY;  Surgeon: Armandina Gemma, MD;  Location: [facility];  Service: General;  Laterality: Right;  . ROTATOR CUFF REPAIR Right     Current Medications: Current Meds  Medication Sig  . acetaminophen (TYLENOL) 325 MG tablet Take 650 mg by mouth every morning.  Marland Kitchen amLODipine (NORVASC) 5 MG tablet TAKE 1 TABLET(5 MG) BY MOUTH DAILY  . aspirin EC 81 MG tablet Take 81 mg by mouth at bedtime.  Marland Kitchen buPROPion (ZYBAN) 150 MG 12  hr tablet Take 150 mg by mouth every morning.  . clopidogrel (PLAVIX) 75 MG tablet Take 1 tablet (75 mg total) by mouth daily.  Marland Kitchen escitalopram (LEXAPRO) 10 MG tablet Take 10 mg by mouth daily.  . Fluticasone Furoate (ARNUITY ELLIPTA) 100 MCG/ACT AEPB Inhale 1 puff into the lungs daily.  Marland Kitchen glimepiride (AMARYL) 1 MG tablet Take 1 mg by mouth daily with breakfast.  . ipratropium-albuterol (DUONEB) 0.5-2.5 (3) MG/3ML SOLN Take 3 mLs by nebulization every 6 (six) hours as needed (wheezing or shortness of breath).  . isosorbide mononitrate (IMDUR) 30 MG 24 hr tablet Take 1 tablet (30 mg total) by mouth daily.  . Multiple Vitamin (MULTIVITAMIN WITH MINERALS) TABS tablet Take 1 tablet by mouth daily.  . nitroGLYCERIN (NITROSTAT) 0.4 MG SL tablet Place 1 tablet (0.4 mg total) under the tongue every 5 (five) minutes x 3 doses as needed for chest pain.  . pantoprazole (PROTONIX) 40 MG tablet Take 1 tablet (40 mg total) by mouth 2 (two) times daily.  . rosuvastatin (CRESTOR) 5 MG tablet Take 10 mg by mouth daily.   . sitaGLIPtin (JANUVIA) 100 MG tablet Take 100 mg by mouth daily.      Allergies:   Codeine, Lipitor [atorvastatin], Zetia [ezetimibe], and Zocor [simvastatin]   Social History   Socioeconomic History  . Marital status: Divorced    Spouse name: Not on file  . Number of children: Not on file  . Years of education: Not on file  . Highest education level: Not on file  Occupational History  . Occupation: Chief Strategy Officer  Tobacco Use  . Smoking status: Former Smoker    Packs/day: 2.50    Years: 31.00    Pack years: 77.50    Types: Cigarettes    Start date: 1962    Quit date: 04/09/1991    Years since quitting: 29.3  . Smokeless tobacco: Never Used  Vaping Use  . Vaping Use: Never used  Substance and Sexual Activity  . Alcohol use: No    Alcohol/week: 0.0 standard drinks  . Drug use: Never    Comment:   marijuana last used 2- weeks ago as of 06-20-16  . Sexual activity: Not on file   Other Topics Concern  . Not on file  Social History Narrative  . Not on file   Social Determinants of Health   Financial Resource Strain: Not on file  Food Insecurity: Not on file  Transportation Needs: Not on file  Physical Activity: Not on file  Stress: Not on file  Social Connections: Not on file     Family History: The patient's family history includes CAD in Brian Hudson father; Cancer in Brian Hudson brother; Diabetic kidney disease in Brian Hudson mother; Healthy in Brian Hudson sister and sister; Heart attack (age of onset: 9) in Brian Hudson father.  ROS:  Please see the history of present illness. All other systems reviewed and are negative.  EKGs/Labs/Other Studies Reviewed:    The following studies were reviewed today: Nucelar Stress Test(07/21/20):  The left ventricular ejection fraction is normal (55-65%).  Nuclear stress EF: 57%.  Blood pressure demonstrated a hypertensive response to exercise.  This is a low risk study.   Exercise time: 9:20 min Workload: 10.7 METS, above average exercise capacity Test stopped due to: THR met BP response: hypertensive 236/83 mmHg with peak stress HR response: normal Rhythm: SR. ECG: horizontal ST segment depression of 3 mm was noted during stress in the II, III, aVF, V4, V5 and V6 leads, beginning at 7 minutes of stress, and returning to baseline after 5-9 minutes of recovery.  No ischemia or infarction on perfusion images. Normal wall motion.   ECHO(06/12/20): 1. Left ventricular ejection fraction, by estimation, is 60 to 65%. The  left ventricle has normal function. The left ventricle has no regional  wall motion abnormalities. There is mild left ventricular hypertrophy.  Left ventricular diastolic parameters  are consistent with Grade II diastolic dysfunction (pseudonormalization).  2. Right ventricular systolic function is normal. The right ventricular  size is normal. There is normal pulmonary artery systolic pressure. The  estimated right  ventricular systolic pressure is 69.6 mmHg.  3. Left atrial size was mildly dilated.  4. The mitral valve is normal in structure. Trivial mitral valve  regurgitation. No evidence of mitral stenosis.  5. The aortic valve is tricuspid. Aortic valve regurgitation is not  visualized. Mild aortic valve stenosis. Aortic valve area, by VTI measures  1.71 cm. Aortic valve mean gradient measures 10.0 mmHg.  6. The inferior vena cava is normal in size with greater than 50%  respiratory variability, suggesting right atrial pressure of 3 mmHg.   LEFT HEART CATH 05/23/2017 LAD ostial 30, distal 50; D1 ostial 50 LCx ostial 30, proximal stent patent, mid 40; RI 90; OM1 stent patent then 80, 50 RCA mid 20; RPAV 50 1. Non-obstructive diffuse disease in the LAD 2. Severe stenosis very small caliber intermediate branch. Unchanged from last cath and too small for PCI 3. Severe stenosis OM1 just beyond old stent. Successful PTCA/DES x 1 OM1. 4. Non-obstructive disease in the large dominant RCA 5. Normal LV systolic function.     GATED SPECT MYO PERF W/EXERCISE STRESS 1D 09/19/2016 Narrative  Markedly positive ECG with 2 mm horizontal ST segment depression in inferior lateral leads Normal perfusion images with no ischemia or infarction EF 53%      History of Present Illness:    Brian Hudson was last seen by Dr. Marlou Porch in 12/2018.  Brian Hudson was recently seen by [name] with pulmonology for follow-up on dyspnea exertion and Wegener's granulomatosis.  Brian Hudson PFTs are normal.  An echocardiogram was obtained and demonstrated normal EF and moderate diastolic dysfunction.  Brian Hudson was started in pulmonary rehabilitation.  It is felt that Brian Hudson shortness of breath is likely related to diastolic dysfunction.  Brian Hudson was asked to follow-up with cardiology.  Brian Hudson returns for f/u.    Brian Hudson is here today with Brian Hudson sister.  Brian Hudson notes chronic shortness of breath.  Brian Hudson gets short of breath with some activities.  This has been ongoing for  at least a year.  Brian Hudson has not had orthopnea, paroxysmal nocturnal dyspnea.  Brian Hudson has not had significant lower extremity swelling.  Brian Hudson has had a couple of episodes of chest discomfort over the past several weeks.  Brian Hudson took nitroglycerin  on one occasion with relief.  Brian Hudson has not really had exertional chest discomfort.  Brian Hudson was working in the yard a few weeks ago and became short of breath and diaphoretic.  Brian Hudson threw up after eating.  Brian Hudson never did check Brian Hudson sugar.  Brian Hudson symptoms sound somewhat suspicious for hypoglycemia.  Brian Hudson has not had syncope.    EKG: EKG is was not ordered today.  Recent Labs: 07/12/2020: NT-Pro BNP 105 07/21/2020: BUN 15; Creatinine, Ser 1.38; Potassium 4.4; Sodium 140  Recent Lipid Panel    Component Value Date/Time   CHOL 122 10/02/2018 0845   TRIG 92 10/02/2018 0845   HDL 35 (L) 10/02/2018 0845   CHOLHDL 3.5 10/02/2018 0845   LDLCALC 69 10/02/2018 0845    Physical Exam:    VS:  BP 112/60 (BP Location: Left Arm, Patient Position: Sitting, Cuff Size: Normal)   Pulse (!) 58   Ht '5\' 8"'  (1.727 m)   Wt 173 lb (78.5 kg)   SpO2 97%   BMI 26.30 kg/m     Wt Readings from Last 3 Encounters:  08/09/20 173 lb (78.5 kg)  07/21/20 170 lb (77.1 kg)  07/12/20 170 lb 9.6 oz (77.4 kg)     GEN: Well nourished, well developed in no acute distress HEENT: Normal NECK: No JVD; No carotid bruits LYMPHATICS: No lymphadenopathy CARDIAC: RRR, no murmurs, rubs, gallops RESPIRATORY:  Clear to auscultation without rales, wheezing or rhonchi  ABDOMEN: Soft, non-tender, non-distended MUSCULOSKELETAL:  No edema; No deformity  SKIN: Warm and dry NEUROLOGIC:  Alert and oriented x 3 PSYCHIATRIC:  Normal affect   ASSESSMENT:    1. Diastolic dysfunction   2. Coronary artery disease involving native heart with angina pectoris, unspecified vessel or lesion type [facility])    PLAN:    In order of problems listed above:  Shortness of breath/diastolic dysfunction/coronary artery disease - Prior  obtuse marginal stenting in 2018 and repeat stenting to the OM.  Normal left ventricular function noted on echocardiogram stress test overall low risk with no perfusion defects of significance.  Brian Hudson did have some ST segment depression and some hypertensive response to exercise. - Currently on antianginals amlodipine, isosorbide.  Continuing with aspirin clopidogrel and Crestor. - Moderate diastolic dysfunction on prior echo.  Agree that some of the shortness of breath that Brian Hudson is experiencing is likely result of diastolic dysfunction or decreased relaxation of Brian Hudson heart.  Continue with exercise, good overall blood pressure control.  Today it is excellent.  Appreciate him seeing [name] with pulmonary.  Essential hypertension - Well-controlled on current therapy  Mixed hyperlipidemia - If LDL remains above 70, consider Zetia.  Brian Hudson is on low-dose Crestor.  Likely could not tolerate higher dosing  Chronic kidney disease stage IIIa - Continue to monitor creatinine.  Brian Hudson is seeing Dr. Posey Pronto with nephrology.  Avoid NSAIDs.  Brian Hudson did state that Brian Hudson was taking some ibuprofen.  I recommended Tylenol.  Follow up in 1 year  Medication Adjustments/Labs and Tests Ordered: Current medicines are reviewed at length with the patient today.  Concerns regarding medicines are outlined above.  No orders of the defined types were placed in this encounter.  No orders of the defined types were placed in this encounter.   Patient Instructions  Medication Instructions:  The current medical regimen is effective;  continue present plan and medications.  *If you need a refill on your cardiac medications before your next appointment, please call your pharmacy*  Follow-Up: At [facility], you  and your health needs are our priority.  As part of our continuing mission to provide you with exceptional heart care, we have created designated Provider Care Teams.  These Care Teams include your primary Cardiologist  (physician) and Advanced Practice Providers (APPs -  Physician Assistants and Nurse Practitioners) who all work together to provide you with the care you need, when you need it.  We recommend signing up for the patient portal called "MyChart".  Sign up information is provided on this After Visit Summary.  MyChart is used to connect with patients for Virtual Visits (Telemedicine).  Patients are able to view lab/test results, encounter notes, upcoming appointments, etc.  Non-urgent messages can be sent to your provider as well.   To learn more about what you can do with MyChart, go to [email or web address].    Your next appointment:   12 month(s)  The format for your next appointment:   In Person  Provider:   Candee Furbish, MD   Thank you for choosing [facility]!!          I,Alexis Bryant,acting as a scribe for Candee Furbish, MD.,have documented all relevant documentation on the behalf of Candee Furbish, MD,as directed by  Candee Furbish, MD while in the presence of Candee Furbish, MD.  I, Candee Furbish, MD, have reviewed all documentation for this visit. The documentation on 08/09/20 for the exam, diagnosis, procedures, and orders are all accurate and complete.  Signed, Candee Furbish, MD  08/09/2020 12:08 PM    [facility]

## 2020-08-17 DIAGNOSIS — C61 Malignant neoplasm of prostate: Secondary | ICD-10-CM | POA: Diagnosis not present

## 2020-08-17 DIAGNOSIS — D509 Iron deficiency anemia, unspecified: Secondary | ICD-10-CM | POA: Diagnosis not present

## 2020-08-17 DIAGNOSIS — D649 Anemia, unspecified: Secondary | ICD-10-CM | POA: Diagnosis not present

## 2020-08-17 DIAGNOSIS — E1165 Type 2 diabetes mellitus with hyperglycemia: Secondary | ICD-10-CM | POA: Diagnosis not present

## 2020-09-05 DIAGNOSIS — E1165 Type 2 diabetes mellitus with hyperglycemia: Secondary | ICD-10-CM | POA: Diagnosis not present

## 2020-09-07 DIAGNOSIS — H524 Presbyopia: Secondary | ICD-10-CM | POA: Diagnosis not present

## 2020-09-15 DIAGNOSIS — N182 Chronic kidney disease, stage 2 (mild): Secondary | ICD-10-CM | POA: Diagnosis not present

## 2020-09-15 DIAGNOSIS — E1122 Type 2 diabetes mellitus with diabetic chronic kidney disease: Secondary | ICD-10-CM | POA: Diagnosis not present

## 2020-09-15 DIAGNOSIS — D631 Anemia in chronic kidney disease: Secondary | ICD-10-CM | POA: Diagnosis not present

## 2020-09-15 DIAGNOSIS — I129 Hypertensive chronic kidney disease with stage 1 through stage 4 chronic kidney disease, or unspecified chronic kidney disease: Secondary | ICD-10-CM | POA: Diagnosis not present

## 2020-09-15 DIAGNOSIS — N183 Chronic kidney disease, stage 3 unspecified: Secondary | ICD-10-CM | POA: Diagnosis not present

## 2020-09-22 ENCOUNTER — Telehealth (HOSPITAL_COMMUNITY): Payer: Self-pay

## 2020-09-22 NOTE — Telephone Encounter (Signed)
Called and spoke with pt in regards to PR, pt stated he is not interested at this time.   Closed referral 

## 2020-10-05 DIAGNOSIS — E1165 Type 2 diabetes mellitus with hyperglycemia: Secondary | ICD-10-CM | POA: Diagnosis not present

## 2020-12-06 ENCOUNTER — Other Ambulatory Visit: Payer: Self-pay | Admitting: *Deleted

## 2020-12-06 MED ORDER — ISOSORBIDE MONONITRATE ER 30 MG PO TB24: 30.0000 mg | ORAL_TABLET | Freq: Every day | ORAL | 11 refills | Status: DC

## 2020-12-27 ENCOUNTER — Other Ambulatory Visit: Payer: Self-pay | Admitting: Cardiology

## 2021-01-11 DIAGNOSIS — K625 Hemorrhage of anus and rectum: Secondary | ICD-10-CM | POA: Diagnosis not present

## 2021-01-18 ENCOUNTER — Encounter: Payer: Self-pay | Admitting: Internal Medicine

## 2021-01-18 ENCOUNTER — Other Ambulatory Visit: Payer: Self-pay

## 2021-01-18 ENCOUNTER — Ambulatory Visit: Payer: Medicare Other | Admitting: Internal Medicine

## 2021-01-18 VITALS — BP 108/58 | HR 58 | Temp 98.2°F | Ht 69.0 in | Wt 170.4 lb

## 2021-01-18 DIAGNOSIS — R0609 Other forms of dyspnea: Secondary | ICD-10-CM

## 2021-01-18 DIAGNOSIS — Z8739 Personal history of other diseases of the musculoskeletal system and connective tissue: Secondary | ICD-10-CM

## 2021-01-18 DIAGNOSIS — Z8679 Personal history of other diseases of the circulatory system: Secondary | ICD-10-CM | POA: Diagnosis not present

## 2021-01-18 DIAGNOSIS — D509 Iron deficiency anemia, unspecified: Secondary | ICD-10-CM | POA: Diagnosis not present

## 2021-01-18 DIAGNOSIS — R911 Solitary pulmonary nodule: Secondary | ICD-10-CM | POA: Diagnosis not present

## 2021-01-18 DIAGNOSIS — R748 Abnormal levels of other serum enzymes: Secondary | ICD-10-CM | POA: Diagnosis not present

## 2021-01-18 DIAGNOSIS — R799 Abnormal finding of blood chemistry, unspecified: Secondary | ICD-10-CM | POA: Diagnosis not present

## 2021-01-18 LAB — SEDIMENTATION RATE: Sed Rate: 12 mm/hr (ref 0–20)

## 2021-01-18 LAB — D-DIMER, QUANTITATIVE: D-Dimer, Quant: 0.45 mcg/mL FEU (ref <0.50)

## 2021-01-18 MED ORDER — SPIRIVA RESPIMAT 1.25 MCG/ACT IN AERS: 2.0000 | INHALATION_SPRAY | Freq: Every day | RESPIRATORY_TRACT | 0 refills | Status: DC

## 2021-01-18 NOTE — Patient Instructions (Addendum)
History of Wegener's granulomatosis none Nodule of left lung - last seen dec 2019 -> stable march 2022 (next followup March 2024) Hx of CAD Dyspnea on exertion - grade 2 diastolic dysfunction Mild air trapping on arnuity   -Shortness of breath mostl likely due to stiff heart muscle aka diastolic dysfunction and recent anemia 11gm% Oct 2022 - Unclear why worse - kidnely function worse compared to 2019 -.> 2022   Plan  - continue arnuity once daily one puff - start empiric spiriva respirat sample - 2 puff once daily - check if wegner is active  - check ANA, MPO, PR-3, GBM, Sed rate, Quantiferon GOld,    - check urine microscopy  - check blood d-dmer 01/18/2021  - await bmet results and ccbc results from 01/18/2021 at [facility]  - 1 weeks with APP; go to ER if worse  - simple walking desatruation test at followup if indicated clinically  - consider rehab re-referral at followup

## 2021-01-18 NOTE — Progress Notes (Signed)
Subjective:     Patient ID: Brian Hudson, male   DOB: [birth year], 70 y.o.   MRN: [id number]  HPI PCP Josetta Huddle, MD   IOV 08/21/2016  Chief Complaint  Patient presents with   Pulm Consult    Referred by Leafy Kindle PA-C for vasculitis.    70 year old male referred by Dr. Gavin Pound and of physician assistant for evaluation of shortness of breath in the setting of previous diagnosis of Wegner's. There is very little outside information but in terms of his Wegener's granulomatosis he tells me that he was diagnosed with this in 2004 following significant sinus symptoms including bloody sinus drainage. This was at [facility]. He was treated for approximately one year with Cytoxan and subsequently on maintenance methotrexate. His last treatment for Wegener's was in 2008. Since then he's been on observation therapy. Treatment Back then  included prednisone as well. Then approximately 2 or 3 years ago he switched care to Dr. Gavin Pound and rheumatology. According to him his been on observation therapy there as well. He tells me now for the last year or 2 he's been more fatigue associated with shortness of breath. He used to walk 2 miles a day but now is unable to do that. Dyspnea is present all the time but definitely more so with exertion and relieved by rest. There is associated cough and sometimes hemoptysis especially early in the morning but this is been ongoing for several years. He thinks his Wegener's might be active. He believes Dr. Gavin Pound is done some blood work to look for vasculitis activity but I do not have those results with me. A CT scan of the chest was done that showed left upper lobe groundglass opacity and therefore he is been here. He denies any chest pain     FeNO 34ppb 08/21/2016 and slightly high  Walking desaturation test on 08/21/2016 185 feet x 3 laps on RA:  did NOT desaturate. Rest pulse ox was 100%, final pulse ox was 90. HR response  50/min at rest to 62/min at peak exertion.    IMPRESSION: CT scan of the chest personally visualized 1. No acute abnormality is seen on CT of the chest. 2. Coronary artery calcifications. 3. Minimal ground-glass opacity in the left upper lobe of doubtful significance. Initial follow-up with CT at 6-12 months is recommended to confirm persistence. If persistent, repeat CT is recommended every 2 years until 5 years of stability has been established. This recommendation follows the consensus statement: Guidelines for Management of Incidental Pulmonary Nodules Detected on CT Images: From the Fleischner Society 2017; Radiology 2017; 284:228-243.     Electronically Signed   By: Ivar Drape M.D.   On: 06/11/2016 17:14    09/11/2016 PFT' Follow Up: Brian Hudson is a 70 y.o. male former smoker quit in 1994 with history of Wegner's Disease since 2004, and dyspnea on exertion.  Pt. Was seen by [name] 08/21/2016 for  dyspnea .Plan at that time was as follows: PLAN  - start symbicort 2 puff twice daily - take sample worth 1 month  - do ono test room air next few to several days  - refer cardiology  - [name] or [facility] first available - sign record releast to get more info on wegner from [name] esp blood work  - do full PFT  Patient presents today for follow-up. Patient has been compliant with Symbicort since 08/21/2016. He states he cannot tell any significant  difference. In his dyspnea. He states he continues to get short of breath at times with exercise and also when talking. He states his shortness of breath does not wake him from sleep. He continues to be unable to walk as he has done in the past. He was seen by [name] at [facility] heart care on 09/06/2016. Of note there is a significant family history which includes coronary artery disease in his father, who had MI at the age of 51.Risk factors include CAD, DM, HLD, + FH premature CAD, hx tobacco use and coronary calcifications on CT  of chest.The patient is Scheduled for an exercise stress test 6/14 and an event monitor 6/14 to rule out tachy bradycardia syndrome. He states he does have secretions in the morning, yellowish with occasional blood tinged secretions, that self resolves..Lab work has not been sent from Dr. Trudie Reed In [place], therefore I am unable to review. Patient denies any fever, chest pain, orthopnea, or hemoptysis.   Test Results:  CT angiogram: 06/11/2016 No acute abnormality is seen on CT of the chest. 2. Coronary artery calcifications. 3. Minimal ground-glass opacity in the left upper lobe of doubtful significance. Initial follow-up with CT at 6-12 months is recommended to confirm persistence  Ambulatory saturation: 08/21/2016 185 feet 3 laps on room air. Did not desaturate. Resting pulse ox was 100%. Final pulse ox was 90%. Heart rate response 50/m at rest is 62/m at peak exertion.  EKG 09/06/2016 Sinus bradycardia with poor R-wave progression, heart rate 44   OV 03/24/2017 Chief Complaint  Patient presents with   Follow-up    CT scan 03/14/17.  Pt states that he still has some tightness in chest, has occ coughing, and is SOB all the time.  Pt also states that he has gotten choked several times while trying to eat.   Remote Wegner's granulomatosis in 2004 status post therapy. Remote smoking history  Follow-up shortness of breath: eing seen in pulmonary clinic for shortness of breath. Earlier this year we referred him to cardiology on basis of coronary artery calcification and strong family history. He status post coronary artery stent 2. After that his cardiac rehabilitation. Dyspnea is only some better he still has residual dyspnea  Follow-up left upper lobe lung nodule is a groundglass opacity. He had follow-up CT scan of the chest without contrast 03/14/2017. This is documented below. It appears the ground glass opacity persists but it is also "close to 2 cm. However he says that his  [name] is under remission. He did see Dr. Trudie Reed earlier this year and apparently blood work was normal.  New issue: Is complaining of nonspecific dysphagia 4 times in the last 2 weeks. It is for solids. He is pretty significant. No associated vomiting or aspiration. His sister confirms the same.  IMPRESSION: ct chest 03/14/17 Persistent 12 x 19 mm ground-glass opacity in the central left upper Lobe. Adenocarcinoma cannot be excluded. Follow up by CT is recommended in 12 months, with continued annual surveillance for a minimum of 3 Years. These recommendations are taken from: Recommendations for the Management of Subsolid Pulmonary Nodules Detected at CT: A Statement from the [facility] 2013; 266:1, [phone].   Aortic Atherosclerosis (ICD10-I70.0).     Electronically Signed   By: Julian Hy M.D.   On: 03/14/2017 09:16   OV 04/29/2017  Chief Complaint  Patient presents with   Follow-up    PET scan done 04/11/17.  Pt has complaints of coughing (non-productive), shortness of breath  Follow-up shortness of breath in this patient with remote [place] history and remote smoking history Follow-up left lower lobe lung nodule  In terms of shortness of breath: He is compliant with his Symbicort for empiric asthma treatment.  He had coronary artery stents in the middle of 2018.  Despite this he is short of breath.  He says this frustrates him significantly.  He tells me that he is able to walk a block or climb steps without a problem or less of a problem.  And otherwise he does not been otherwise he does have some dyspnea on exertion for these activities.  But the significant amount of dyspnea happens when he is talking a lot.  He feels this is Wegener's dj vu again.  He did have Wegener's serology and did see Dr. Trudie Reed in rheumatology end of 2018 and there is no evidence of Wegener's recurrence but he still feels this is white not measurable.  He has an appointment  upcoming with Dr. Candee Furbish his cardiologist.  In terms of left lower lobe lung nodule groundglass opacity: This was noted December 2018 CT chest.  He had a follow-up CT PET in January 2019 that showed this nodule was beginning to shrink.  His Wegner antibodies were negative/trace positive.  He has been reassured.  He requires a follow-up CT scan end of 2019.  New issue: Is complaining of chronic sinus discharge for months.  He feels is all Wegener's again.  I have repeatedly told him that so far we do not have evidence of recurrence.  OV 06/13/2017  Chief Complaint  Patient presents with   Follow-up    lung nodule, no SOB, no Wheezing, No chest tightness    Follow-up dyspnea: In terms of dyspnea he had a cardiopulmonary stress test that suggested coronary issues.  He then had a cardiac cath in February 2019 and the place a second coronary stent.  He tells me now that his dyspnea is better.  In fact when he talks he gets dyspnea and even this is better but he still has residual dyspnea.  He is trying to get into cardiac rehab and is waiting to hear from them.  There is no wheezing or cough  In terms of left lower lobe lung nodule groundglass opacity: He has follow-up scan pending in December 2019.  There are no other new issues.  OV 05/12/2020  Subjective:  Patient ID: Brian Hudson, male , DOB: 1950-07-06 , age 52 y.o. , MRN: [id number] , ADDRESS: [place] [place] [id number] PCP Josetta Huddle, MD Patient Care Team: Josetta Huddle, MD as PCP - General (Internal Medicine) Jerline Pain, MD as PCP - Cardiology (Cardiology)  This Provider for this visit: Treatment Team:  Attending Provider: Brand Males, MD    05/12/2020 -   Chief Complaint  Patient presents with   Follow-up    SOB getting worse.    Follow-up history of Wegener's with left lower lobe nodule -Follow-up remote smoking Follow-up dyspnea on exertion -Has associated coronary artery  disease.   HPI Brian Hudson 70 y.o. -almost 3 years since I last saw him.  This is just under 3-year visit.  This is the further routine follow-up visit.  He tells me that in the last 3 years he had insidious onset of worsening shortness of breath.  This is accelerated in the last 6 months.  Talking makes it worse the worst is when he talks.  Although it is exertional when he  change his clothes and he climbs stairs.  He looks like of having a flat affect but it did not discuss any anxiety or depression in him.  He has not seen the cardiologist in a while.  He says he has upcoming appointment although I did not see it.  His cardiologist Dr. Candee Furbish.  He has a history of associated coronary artery disease and coronary artery stents but he denies any chest pain.  No orthopnea proximal nocturnal dyspnea or wheezing.  He does have a tickle in his throat when he lies down but otherwise no cough.  Last CT scan of the chest 2019 showing slight reduction in groundglass lesion in the left upper lobe.    CT Chest dat - dec 2019   IMPRESSION: 1. Stable to slightly smaller left upper lobe ground-glass lesion, measuring 9 x 18 mm today. Follow up by CT is recommended in 12 months, with continued annual surveillance for a minimum of 3 years. These recommendations are taken from: Recommendations for the Management of Subsolid Pulmonary Nodules Detected at CT: A Statement from the [facility] 2013; 266:1, [id number]. 2.  Aortic Atherosclerois (ICD10-170.0)     Electronically Signed   By: Misty Stanley M.D.   On: 03/11/2018 17:00     PFT  No flowsheet data found.      OV 06/28/2020  Subjective:  Patient ID: Brian Hudson, male , DOB: November 03, 1950 , age 71 y.o. , MRN: [id number] , ADDRESS: [place] [id number] PCP Josetta Huddle, MD Patient Care Team: Josetta Huddle, MD as PCP - General (Internal Medicine) Jerline Pain, MD as PCP - Cardiology  (Cardiology)  This Provider for this visit: Treatment Team:  Attending Provider: Brand Males, MD    06/28/2020 -   Chief Complaint  Patient presents with   Follow-up    PFT performed today.  States he has been doing okay since last visit and denies any real complaints.   Follow-up history of Wegener's with left lower lobe nodule -Follow-up remote smoking Follow-up dyspnea on exertion -Has associated coronary artery disease.   HPI Brian Hudson 70 y.o. -presents for follow-up of his worsening dyspnea.  This visit is to review test results.  He tells me that overall he is stable still has this dyspnea on exertion although this time he tells me it is present on and off.  Test results show the CT scan just shows mild air trapping his lung nodule is stable in 2-3 years but is worse over the last 4 years.  His pulmonary function test is normal his nitric oxide test is normal.  His echocardiogram shows grade 2 diastolic dysfunction.  He denies any hypertension obesity.  He has not seen his cardiologist Dr. Candee Furbish despite assurance at the last visit that he would see him.  He has never done pulmonary rehabilitation.  We discussed several care options and he has agreed to start with pulmonary rehabilitation and reestablish with cardiology.   FeNO   - 18ppb 06/28/2020   CT chest 06/07/20   IMPRESSION: 1. No evidence of interstitial lung disease. 2. Irregular ground-glass nodule in the left upper lobe, stable from 03/11/2018 but possibly slightly increased in prominence from baseline 06/11/2016. Follow-up CT chest without contrast in 2 years is recommended. This recommendation follows the consensus statement: Guidelines for Management of Small Pulmonary Nodules Detected on CT Images: From the Fleischner Society 2017; Radiology 2017; 284:228-243. 3. Mild air trapping is indicative of small  airways disease. 4. Aortic atherosclerosis (ICD10-I70.0). Coronary artery calcification.      Electronically Signed   By: Lorin Picket M.D.   On: 06/09/2020 09:25      ECHO march 2022   IMPRESSIONS     1. Left ventricular ejection fraction, by estimation, is 60 to 65%. The  left ventricle has normal function. The left ventricle has no regional  wall motion abnormalities. There is mild left ventricular hypertrophy.  Left ventricular diastolic parameters  are consistent with Grade II diastolic dysfunction (pseudonormalization).   2. Right ventricular systolic function is normal. The right ventricular  size is normal. There is normal pulmonary artery systolic pressure. The  estimated right ventricular systolic pressure is 06.3 mmHg.   3. Left atrial size was mildly dilated.   4. The mitral valve is normal in structure. Trivial mitral valve  regurgitation. No evidence of mitral stenosis.   5. The aortic valve is tricuspid. Aortic valve regurgitation is not  visualized. Mild aortic valve stenosis. Aortic valve area, by VTI measures  1.71 cm. Aortic valve mean gradient measures 10.0 mmHg.   6. The inferior vena cava is normal in size with greater than 50%  respiratory variability, suggesting right atrial pressure of 3 mmHg.    OV 01/18/2021  Subjective:  Patient ID: Brian Hudson, male , DOB: 12-Aug-1950 , age 77 y.o. , MRN: [id number] , ADDRESS: [place] [id number] PCP Josetta Huddle, MD Patient Care Team: Josetta Huddle, MD as PCP - General (Internal Medicine) Jerline Pain, MD as PCP - Cardiology (Cardiology)  This Provider for this visit: Treatment Team:  Attending Provider: Brand Males, MD    01/18/2021 -   Chief Complaint  Patient presents with   Follow-up    Pt states he has been doing okay since last visit. States he still becomes SOB with exertion and also has had an occ cough.   Follow-up history of Wegener's with left lower lobe nodule -Follow-up remote smoking Follow-up dyspnea on exertion -Has associated coronary  artery disease.and gr 2 ddx  HPI Brian Hudson 70 y.o. -presents with her sister for routine follow-up.  He continues to as always complain of worsening shortness of breath at each visit.  Again he is doing the same.  In the spring 2022 I referred him back to Dr. Candee Furbish because there was no ILD on the CT scan.  He has grade 2 diastolic dysfunction.  Dr. Marlou Porch is evaluated him.  Since then he seen [facility] physicians.  This month 2022 approximately around January 11, 2021 he had blood work.  Shows hemoglobin of 11 g%.  This is a says it is new onset anemia.  His creatinine was 1.46 mg percent.  This is consistent with the April 2022 baseline but worsened in 2019.  This is what it is Wagner's his back but he denies any hematuria or hemoptysis.  He has not had a urine analysis checked.  He is willing to have that done.  There is no pedal edema.  Nevertheless the sister thinks that he is genuinely worse in the past 1 week.  There is no syncope.  He takes empiric Arnuity [last visit we cut his Symbicort down to Arnuity].  There is no obvious emphysema on the CT.  I discussed about giving empiric trial with Spiriva he is willing to do that.  Is just to see if this would help his dyspnea because of the mild air trapping seen on CT chest  in spring 2022.  There is no cough or wheezing or fever or weight loss.    CT Chest data  No results found.    PFT  PFT Results Latest Ref Rng & Units 06/28/2020  FVC-Pre L 4.63  FVC-Predicted Pre % 114  FVC-Post L 4.72  FVC-Predicted Post % 116  Pre FEV1/FVC % % 75  Post FEV1/FCV % % 76  FEV1-Pre L 3.45  FEV1-Predicted Pre % 116  FEV1-Post L 3.57  DLCO uncorrected ml/min/mmHg 23.01  DLCO UNC% % 95  DLCO corrected ml/min/mmHg 23.01  DLCO COR %Predicted % 95  DLVA Predicted % 78  TLC L 7.05  TLC % Predicted % 106  RV % Predicted % 90       has a past medical history of Arthritis, CAD (coronary artery disease), Concussion, Depression, DM (diabetes  mellitus) ([place]), Dyspnea, Dysrhythmia, ED (erectile dysfunction), Fracture (2010), GERD (gastroesophageal reflux disease), HTN (hypertension), Hyperlipidemia, IBS (irritable bowel syndrome), Palpitations, Peripheral vascular disease ([place]), Pneumonia, Prostate cancer ([place]) (2007), Sigmoid diverticulosis, and Wegener's granulomatosis.   reports that he quit smoking about 29 years ago. His smoking use included cigarettes. He started smoking about 60 years ago. He has a 77.50 pack-year smoking history. He has never used smokeless tobacco.  Past Surgical History:  Procedure Laterality Date   CARDIAC CATHETERIZATION     COLONOSCOPY N/A 06/24/2016   Procedure: COLONOSCOPY with propofol;  Surgeon: Garlan Fair, MD;  Location: [facility];  Service: Endoscopy;  Laterality: N/A;   colonscopy  2008   CORONARY STENT INTERVENTION N/A 09/20/2016   Procedure: Coronary Stent Intervention;  Surgeon: Leonie Man, MD;  Location: [place] [facility];  Service: Cardiovascular;  Laterality: N/A;   CORONARY STENT INTERVENTION N/A 05/23/2017   Procedure: CORONARY STENT INTERVENTION;  Surgeon: Burnell Blanks, MD;  Location: [place] [facility];  Service: Cardiovascular;  Laterality: N/A;   INSERTION PROSTATE RADIATION SEED  2007   INTRAVASCULAR PRESSURE WIRE/FFR STUDY N/A 09/20/2016   Procedure: Intravascular Pressure Wire/FFR Study;  Surgeon: Leonie Man, MD;  Location: [place] [facility];  Service: Cardiovascular;  Laterality: N/A;   LEFT HEART CATH AND CORONARY ANGIOGRAPHY N/A 09/20/2016   Procedure: Left Heart Cath and Coronary Angiography;  Surgeon: Leonie Man, MD;  Location: [place] [facility];  Service: Cardiovascular;  Laterality: N/A;   LEFT HEART CATH AND CORONARY ANGIOGRAPHY N/A 05/23/2017   Procedure: LEFT HEART CATH AND CORONARY ANGIOGRAPHY;  Surgeon: Burnell Blanks, MD;  Location: [place] [facility];  Service: Cardiovascular;  Laterality: N/A;   PARATHYROIDECTOMY Right  02/26/2018   Procedure: RIGHT INFERIOR PARATHYROIDECTOMY;  Surgeon: Armandina Gemma, MD;  Location: [facility];  Service: General;  Laterality: Right;   ROTATOR CUFF REPAIR Right     Allergies  Allergen Reactions   Codeine Other (See Comments)    "jittery"   Lipitor [Atorvastatin] Other (See Comments)    arthralgia   Zetia [Ezetimibe] Other (See Comments)    arthralgia   Zocor [Simvastatin] Other (See Comments)    arthralgia    Immunization History  Administered Date(s) Administered   Influenza Split 01/24/2009, 01/19/2012, 01/18/2013   Influenza, High Dose Seasonal PF 03/09/2015, 01/06/2017, 01/22/2017, 01/08/2018, 01/20/2019, 03/01/2020   Influenza,inj,Quad PF,6+ Mos 01/18/2011   PFIZER(Purple Top)SARS-COV-2 Vaccination 12/09/2019, 12/31/2019   Pneumococcal Conjugate-13 03/09/2015   Pneumococcal Polysaccharide-23 02/19/2012, 01/08/2018   Td 04/09/1999   Tdap 05/03/2020   Zoster, Live 03/01/2020, 05/03/2020    Family History  Problem Relation Age of  Onset   Heart attack Father 21   CAD Father    Diabetic kidney disease Mother    Healthy Sister    Cancer Brother    Healthy Sister      Current Outpatient Medications:    acetaminophen (TYLENOL) 325 MG tablet, Take 650 mg by mouth every morning., Disp: , Rfl:    amLODipine (NORVASC) 5 MG tablet, TAKE 1 TABLET(5 MG) BY MOUTH DAILY, Disp: 30 tablet, Rfl: 11   aspirin EC 81 MG tablet, Take 81 mg by mouth at bedtime., Disp: , Rfl:    buPROPion (ZYBAN) 150 MG 12 hr tablet, Take 150 mg by mouth every morning., Disp: , Rfl: 5   clopidogrel (PLAVIX) 75 MG tablet, TAKE 1 TABLET(75 MG) BY MOUTH DAILY, Disp: 30 tablet, Rfl: 8   escitalopram (LEXAPRO) 10 MG tablet, Take 10 mg by mouth daily., Disp: , Rfl:    Fluticasone Furoate (ARNUITY ELLIPTA) 100 MCG/ACT AEPB, Inhale 1 puff into the lungs daily., Disp: 30 each, Rfl: 5   glimepiride (AMARYL) 1 MG tablet, Take 1 mg by mouth daily with breakfast., Disp: , Rfl:    isosorbide mononitrate  (IMDUR) 30 MG 24 hr tablet, Take 1 tablet (30 mg total) by mouth daily., Disp: 30 tablet, Rfl: 11   Multiple Vitamin (MULTIVITAMIN WITH MINERALS) TABS tablet, Take 1 tablet by mouth daily., Disp: , Rfl:    pantoprazole (PROTONIX) 40 MG tablet, Take 1 tablet (40 mg total) by mouth 2 (two) times daily., Disp: 60 tablet, Rfl: 2   rosuvastatin (CRESTOR) 5 MG tablet, Take 10 mg by mouth daily. , Disp: , Rfl:    sitaGLIPtin (JANUVIA) 100 MG tablet, Take 100 mg by mouth daily. , Disp: , Rfl:    Tiotropium Bromide Monohydrate (SPIRIVA RESPIMAT) 1.25 MCG/ACT AERS, Inhale 2 puffs into the lungs daily., Disp: 4 g, Rfl: 0   ipratropium-albuterol (DUONEB) 0.5-2.5 (3) MG/3ML SOLN, Take 3 mLs by nebulization every 6 (six) hours as needed (wheezing or shortness of breath). (Patient not taking: Reported on 01/18/2021), Disp: , Rfl:    nitroGLYCERIN (NITROSTAT) 0.4 MG SL tablet, Place 1 tablet (0.4 mg total) under the tongue every 5 (five) minutes x 3 doses as needed for chest pain. (Patient not taking: Reported on 01/18/2021), Disp: 25 tablet, Rfl: 1      Objective:   Vitals:   01/18/21 0959  BP: (!) 108/58  Pulse: (!) 58  Temp: 98.2 F (36.8 C)  TempSrc: Oral  SpO2: 98%  Weight: 170 lb 6.4 oz (77.3 kg)  Height: 5\' 9"  (1.753 m)    Estimated body mass index is 25.16 kg/m as calculated from the following:   Height as of this encounter: 5\' 9"  (1.753 m).   Weight as of this encounter: 170 lb 6.4 oz (77.3 kg).  @WEIGHTCHANGE @  [employer]   01/18/21 0959  Weight: 170 lb 6.4 oz (77.3 kg)     Physical Exam  General: No distress. anxious Neuro: Alert and Oriented x 3. GCS 15. Speech normal Psych: Pleasant Resp:  Barrel Chest - no.  Wheeze - no, Crackles - no, No overt respiratory distress CVS: Normal heart sounds. Murmurs - no Ext: Stigmata of Connective Tissue Disease - no HEENT: Normal upper airway. PEERL +. No post nasal drip        Assessment:       ICD-10-CM   1. Dyspnea on  exertion  R06.09 ANA    Mpo/pr-3 (anca) antibodies    Sedimentation rate  QuantiFERON-TB Gold Plus    Glomerular Membrane Antibodies    D-dimer, quantitative    2. History of granulomatosis with polyangiitis  Z86.79 ANA    Mpo/pr-3 (anca) antibodies    Sedimentation rate    QuantiFERON-TB Gold Plus    Glomerular Membrane Antibodies    D-dimer, quantitative    3. Nodule of left lung  R91.1          Plan:     Patient Instructions  History of Wegener's granulomatosis none Nodule of left lung - last seen dec 2019 -> stable march 2022 (next followup March 2024) Hx of CAD Dyspnea on exertion - grade 2 diastolic dysfunction Mild air trapping on arnuity   -Shortness of breath mostl likely due to stiff heart muscle aka diastolic dysfunction and recent anemia 11gm% Oct 2022 - Unclear why worse - kidnely function worse compared to 2019 -.> 2022   Plan  - continue arnuity once daily one puff - start empiric spiriva respirat sample - 2 puff once daily - check if wegner is active  - check ANA, MPO, PR-3, GBM, Sed rate, Quantiferon GOld,    - check urine microscopy  - check blood d-dmer 01/18/2021  - await bmet results and ccbc results from 01/18/2021 at [facility]  - 1 weeks with APP; go to ER if worse  - simple walking desatruation test at followup if indicated clinically  - consider rehab re-referral at followup    SIGNATURE    Dr. Brand Males, M.D., F.C.C.P,  Pulmonary and Critical Care Medicine Staff Physician, [place] Director - Interstitial Lung Disease  Program  [facility] [place] [facility] [place], [place], [address]  Pager: [phone], If no answer or between  15:00h - 7:00h: call [phone] Telephone: [phone]  10:32 AM 01/18/2021

## 2021-01-18 NOTE — Addendum Note (Signed)
Addended by: Suzzanne Cloud E on: 01/18/2021 10:40 AM   Modules accepted: Orders

## 2021-01-23 LAB — GLOMERULAR BASEMENT MEMBRANE ANTIBODIES: GBM Ab: <1 AI (ref <1.0)

## 2021-01-23 LAB — ANTI-NUCLEAR AB-TITER (ANA TITER): ANA Titer 1: 1:40 {titer} — ABNORMAL HIGH

## 2021-01-23 LAB — ANA: Anti Nuclear Antibody (ANA): POSITIVE — AB

## 2021-01-23 LAB — MPO/PR-3 (ANCA) ANTIBODIES
Myeloperoxidase Abs: <1 AI
Serine Protease 3: 64.6 AI — ABNORMAL HIGH

## 2021-01-24 LAB — QUANTIFERON-TB GOLD PLUS
Mitogen-NIL: 7.98 IU/mL
NIL: 0.08 IU/mL
QuantiFERON-TB Gold Plus: NEGATIVE
TB1-NIL: <0 IU/mL
TB2-NIL: <0 IU/mL

## 2021-01-25 ENCOUNTER — Ambulatory Visit: Payer: Medicare Other | Admitting: Primary Care

## 2021-01-25 ENCOUNTER — Telehealth: Payer: Self-pay | Admitting: Primary Care

## 2021-01-25 ENCOUNTER — Encounter: Payer: Self-pay | Admitting: Primary Care

## 2021-01-25 ENCOUNTER — Other Ambulatory Visit: Payer: Self-pay

## 2021-01-25 DIAGNOSIS — Z8679 Personal history of other diseases of the circulatory system: Secondary | ICD-10-CM | POA: Diagnosis not present

## 2021-01-25 DIAGNOSIS — R0609 Other forms of dyspnea: Secondary | ICD-10-CM | POA: Diagnosis not present

## 2021-01-25 NOTE — Telephone Encounter (Signed)
Hx Wegeners. His shortness of breath is better with addition of Spiriva. Preforming simple walk test now. Serine Protease 3 was elevated at 64. ANA was positive; Titer 1:40  He has seen Gavin Pound in the past but is not currently following. Do you want him to return to rheumatology? Any further testing or recommendations?

## 2021-01-25 NOTE — Progress Notes (Signed)
@Patient  ID: Brian Hudson, male    DOB: 01/18/1951, 70 y.o.   MRN: [id number]  Chief Complaint  Patient presents with   Follow-up    Sob better    Referring provider: Josetta Huddle, MD  HPI: 70 year old male, former smoker. PMH significant for HTN, CAD, coronary artery calcification, diabetes mellitus, hyperparathyroidism, prostate cancer, wegener's granulomatosis.   01/25/2021- Interim hx  Patient presents today for follow-up. He is feeling better, shortness of breath has improved some. He still runs out of breath before he can often finish sentences. He has a rare productive cough. He is using Arnuity daily as prescribed and was recently started on spiriva. He has not needed to use his nebulizer.   He has had some vision changes in the last year, prescription was changed to bifocals in July. He also has long term hearing loss. Recent lab work showed elevated serine Protease 3 at 64. He follows with Dr. Reather Laurence with nephrology and used to follow with Dr. Trudie Reed but has not been back in some time. Needs simple walk test today.   Allergies  Allergen Reactions   Codeine Other (See Comments)    "jittery"   Lipitor [Atorvastatin] Other (See Comments)    arthralgia   Zetia [Ezetimibe] Other (See Comments)    arthralgia   Zocor [Simvastatin] Other (See Comments)    arthralgia    Immunization History  Administered Date(s) Administered   Influenza Split 01/24/2009, 01/19/2012, 01/18/2013   Influenza, High Dose Seasonal PF 03/09/2015, 01/06/2017, 01/22/2017, 01/08/2018, 01/20/2019, 03/01/2020   Influenza,inj,Quad PF,6+ Mos 01/18/2011   PFIZER(Purple Top)SARS-COV-2 Vaccination 12/09/2019, 12/31/2019   Pneumococcal Conjugate-13 03/09/2015   Pneumococcal Polysaccharide-23 02/19/2012, 01/08/2018   Td 04/09/1999   Tdap 05/03/2020   Zoster, Live 03/01/2020, 05/03/2020    Past Medical History:  Diagnosis Date   Arthritis    osteoarthritis    CAD (coronary artery disease)    a.  09/20/16: 45% OM, 99% ostial ramus s/p DES, 80% proximal LCx s/p DES, 80% more distal LCx on AV groove portion of the circumflex after OM1 s/p cutting balloon and PCTA. // Ex Myoview 4/22: 9'20", 10.7 METs, BP 236/83 at pk stress, 3 mm ST depression inf-lat, no symptoms / EF 57, no ischemia or infarction; low risk     Concussion    Depression    DM (diabetes mellitus) ([place])    type 2   Dyspnea    with exertion    Dysrhythmia    hx of atrial fib- 15-20 years ago    ED (erectile dysfunction)    Fracture 2010   L 3 and L 4 healed with brace   GERD (gastroesophageal reflux disease)    HTN (hypertension)    Hyperlipidemia    IBS (irritable bowel syndrome)    Palpitations    none in last few yrs   Peripheral vascular disease ([place])    legs    Pneumonia    hx of    Prostate cancer ([place]) 2007   Sigmoid diverticulosis    Wegener's granulomatosis    followed by dr Berna Bue    Tobacco History: Social History   Tobacco Use  Smoking Status Former   Packs/day: 2.50   Years: 31.00   Pack years: 77.50   Types: Cigarettes   Start date: 1962   Quit date: 04/09/1991   Years since quitting: 29.8  Smokeless Tobacco Never   Counseling given: Not Answered   Outpatient Medications Prior to Visit  Medication Sig Dispense  Refill   acetaminophen (TYLENOL) 325 MG tablet Take 650 mg by mouth every morning.     amLODipine (NORVASC) 5 MG tablet TAKE 1 TABLET(5 MG) BY MOUTH DAILY 30 tablet 11   aspirin EC 81 MG tablet Take 81 mg by mouth at bedtime.     buPROPion (ZYBAN) 150 MG 12 hr tablet Take 150 mg by mouth every morning.  5   clopidogrel (PLAVIX) 75 MG tablet TAKE 1 TABLET(75 MG) BY MOUTH DAILY 30 tablet 8   escitalopram (LEXAPRO) 10 MG tablet Take 10 mg by mouth daily.     Fluticasone Furoate (ARNUITY ELLIPTA) 100 MCG/ACT AEPB Inhale 1 puff into the lungs daily. 30 each 5   glimepiride (AMARYL) 1 MG tablet Take 1 mg by mouth daily with breakfast.     isosorbide mononitrate (IMDUR) 30 MG  24 hr tablet Take 1 tablet (30 mg total) by mouth daily. 30 tablet 11   Multiple Vitamin (MULTIVITAMIN WITH MINERALS) TABS tablet Take 1 tablet by mouth daily.     nitroGLYCERIN (NITROSTAT) 0.4 MG SL tablet Place 1 tablet (0.4 mg total) under the tongue every 5 (five) minutes x 3 doses as needed for chest pain. 25 tablet 1   pantoprazole (PROTONIX) 40 MG tablet Take 1 tablet (40 mg total) by mouth 2 (two) times daily. 60 tablet 2   rosuvastatin (CRESTOR) 5 MG tablet Take 10 mg by mouth daily.      sitaGLIPtin (JANUVIA) 100 MG tablet Take 100 mg by mouth daily.      Tiotropium Bromide Monohydrate (SPIRIVA RESPIMAT) 1.25 MCG/ACT AERS Inhale 2 puffs into the lungs daily. 4 g 0   ipratropium-albuterol (DUONEB) 0.5-2.5 (3) MG/3ML SOLN Take 3 mLs by nebulization every 6 (six) hours as needed (wheezing or shortness of breath). (Patient not taking: No sig reported)     No facility-administered medications prior to visit.    Review of Systems  Review of Systems  Constitutional: Negative.   HENT:  Positive for hearing loss.   Eyes:  Positive for visual disturbance.  Respiratory:  Positive for cough and shortness of breath. Negative for chest tightness and wheezing.   Musculoskeletal:  Positive for arthralgias.    Physical Exam  BP (!) 146/58 (BP Location: Left Arm, Cuff Size: Normal)   Pulse (!) 54   Temp 98.5 F (36.9 C) (Temporal)   Ht 5\' 9"  (1.753 m)   Wt 169 lb 6.4 oz (76.8 kg)   SpO2 98%   BMI 25.02 kg/m  Physical Exam Constitutional:      Appearance: Normal appearance.  HENT:     Mouth/Throat:     Mouth: Mucous membranes are moist.     Pharynx: Oropharynx is clear.  Cardiovascular:     Rate and Rhythm: Normal rate and regular rhythm.  Pulmonary:     Effort: Pulmonary effort is normal.     Breath sounds: Normal breath sounds. No wheezing or rhonchi.  Musculoskeletal:        General: Normal range of motion.     Cervical back: Normal range of motion and neck supple.  Skin:     General: Skin is warm and dry.     Findings: No lesion or rash.  Neurological:     General: No focal deficit present.     Mental Status: He is alert and oriented to person, place, and time. Mental status is at baseline.  Psychiatric:        Mood and Affect: Mood normal.  Behavior: Behavior normal.        Thought Content: Thought content normal.        Judgment: Judgment normal.     Lab Results:  CBC    Component Value Date/Time   WBC 5.2 02/20/2018 0935   RBC 4.34 02/20/2018 0935   HGB 12.8 (L) 02/20/2018 0935   HGB 12.6 (L) 05/15/2017 1504   HCT 40.0 02/20/2018 0935   HCT 37.0 (L) 05/15/2017 1504   PLT 187 02/20/2018 0935   PLT 180 05/15/2017 1504   MCV 92.2 02/20/2018 0935   MCV 89 05/15/2017 1504   MCH 29.5 02/20/2018 0935   MCHC 32.0 02/20/2018 0935   RDW 13.2 02/20/2018 0935   RDW 13.4 05/15/2017 1504    BMET    Component Value Date/Time   NA 140 07/21/2020 0727   K 4.4 07/21/2020 0727   CL 103 07/21/2020 0727   CO2 21 07/21/2020 0727   GLUCOSE 144 (H) 07/21/2020 0727   GLUCOSE 241 (H) 02/20/2018 0935   BUN 15 07/21/2020 0727   CREATININE 1.38 (H) 07/21/2020 0727   CALCIUM 9.3 07/21/2020 0727   GFRNONAA >60 02/20/2018 0935   GFRAA >60 02/20/2018 0935    BNP No results found for: BNP  ProBNP    Component Value Date/Time   PROBNP 105 07/12/2020 1323    Imaging: No results found.   Assessment & Plan:   History of granulomatosis with polyangiitis - Patient has been experiencing exertional dyspnea. He has some associated vision and hearing changes. Kidney function worse 2019 > 2022. Serine Protease 3 was elevated at 64 on most recent lab work. Recommend he return to see rheumatology Dr. Trudie Reed.   Dyspnea on exertion - Mild air trapping seen on PFTs. Breathing improved with recent addition of LAMA inhaler. Simple walk test in office today did not reveal any oxygen desaturations. Continue Arnuity and Spiriva respimat 1.71mcg 2 puffs once daily.     Martyn Ehrich, NP 01/26/2021

## 2021-01-25 NOTE — Patient Instructions (Addendum)
Serine Protease 3 was elevated at 64. I will discuss with [name], he will likely want you to follow-up with Dr. Trudie Reed (Phone: [phone]) and Dr. Reather Laurence  Recommendations: Continue Arnuity one puff daily  Continue Spiriva Respimat 2 puffs morning and evening  Use duoneb every 6 hours as needed for breakthrough shortness of breath or wheezing  Orders: Needs simple walk test in office today   Follow-up: 3 months with [name]

## 2021-01-25 NOTE — Telephone Encounter (Signed)
error 

## 2021-01-26 ENCOUNTER — Encounter: Payer: Self-pay | Admitting: Primary Care

## 2021-01-26 DIAGNOSIS — M3131 Wegener's granulomatosis with renal involvement: Secondary | ICD-10-CM | POA: Insufficient documentation

## 2021-01-26 NOTE — Telephone Encounter (Signed)
Attempted to call pt but unable to reach. Left message for him to return call. °

## 2021-01-26 NOTE — Telephone Encounter (Signed)
Please let patient know [name] wants him to follow up with Dr. Trudie Reed. If he needs a new referral we can place re: wegners disease

## 2021-01-26 NOTE — Assessment & Plan Note (Addendum)
-   Mild air trapping seen on PFTs. Breathing improved with recent addition of LAMA inhaler. Simple walk test in office today did not reveal any oxygen desaturations. Continue Arnuity and Spiriva respimat 1.49mcg 2 puffs once daily.

## 2021-01-26 NOTE — Telephone Encounter (Signed)
Should see [name] again

## 2021-01-26 NOTE — Assessment & Plan Note (Addendum)
-   Patient has been experiencing exertional dyspnea. He has some associated vision and hearing changes. Kidney function worse 2019 > 2022. Serine Protease 3 was elevated at 64 on most recent lab work. Recommend he return to see rheumatology Dr. Trudie Reed.

## 2021-01-26 NOTE — Telephone Encounter (Signed)
I attempted to call patient but was unable to reach. LM. When he calls back please tell him I want him to go back to see Dr. Gavin Pound with rheumatology. Ask if he needs a referral. Also I incorrectly wrote instructions for his Spiriva respimat on his AVS. He should only be using Spiriva once a day 2 puffs. Thanks

## 2021-01-29 DIAGNOSIS — K921 Melena: Secondary | ICD-10-CM | POA: Diagnosis not present

## 2021-01-29 DIAGNOSIS — D509 Iron deficiency anemia, unspecified: Secondary | ICD-10-CM | POA: Diagnosis not present

## 2021-01-29 DIAGNOSIS — Z86010 Personal history of colonic polyps: Secondary | ICD-10-CM | POA: Diagnosis not present

## 2021-01-30 NOTE — Telephone Encounter (Signed)
Left message for patient to call back  

## 2021-02-16 DIAGNOSIS — D509 Iron deficiency anemia, unspecified: Secondary | ICD-10-CM | POA: Diagnosis not present

## 2021-03-07 DIAGNOSIS — E1122 Type 2 diabetes mellitus with diabetic chronic kidney disease: Secondary | ICD-10-CM | POA: Diagnosis not present

## 2021-03-14 DIAGNOSIS — E1165 Type 2 diabetes mellitus with hyperglycemia: Secondary | ICD-10-CM | POA: Diagnosis not present

## 2021-03-14 DIAGNOSIS — D509 Iron deficiency anemia, unspecified: Secondary | ICD-10-CM | POA: Diagnosis not present

## 2021-03-14 DIAGNOSIS — Z0000 Encounter for general adult medical examination without abnormal findings: Secondary | ICD-10-CM | POA: Diagnosis not present

## 2021-03-14 DIAGNOSIS — Z23 Encounter for immunization: Secondary | ICD-10-CM | POA: Diagnosis not present

## 2021-03-14 DIAGNOSIS — M3130 Wegener's granulomatosis without renal involvement: Secondary | ICD-10-CM | POA: Diagnosis not present

## 2021-03-14 DIAGNOSIS — F322 Major depressive disorder, single episode, severe without psychotic features: Secondary | ICD-10-CM | POA: Diagnosis not present

## 2021-03-14 DIAGNOSIS — I10 Essential (primary) hypertension: Secondary | ICD-10-CM | POA: Diagnosis not present

## 2021-03-14 DIAGNOSIS — E785 Hyperlipidemia, unspecified: Secondary | ICD-10-CM | POA: Diagnosis not present

## 2021-03-16 ENCOUNTER — Telehealth: Payer: Self-pay | Admitting: *Deleted

## 2021-03-16 NOTE — Telephone Encounter (Signed)
   Pre-operative Risk Assessment    Patient Name: Brian Hudson  DOB: [birth year] MRN: [id number]      Request for Surgical Clearance   Procedure:   COLONOSCOPY  Date of Surgery: Clearance 03/27/21                                 Surgeon:  DR. MARC MAGOD Surgeon's Group or Practice Name:  [facility] Phone number:  [phone] Fax number:  [phone]   Type of Clearance Requested: - Medical  - Pharmacy:  Hold Clopidogrel (Plavix)     Type of Anesthesia:   PROPOFOL   Additional requests/questions:   [name]   03/16/2021, 9:47 AM

## 2021-03-16 NOTE — Telephone Encounter (Signed)
   Name: Brian Hudson  DOB: [birth year]  MRN: [id number]   Primary Cardiologist: Candee Furbish, MD  Chart reviewed as part of pre-operative protocol coverage. Patient was contacted 03/16/2021 in reference to pre-operative risk assessment for pending surgery as outlined below.  Brian Hudson was last seen 08/2020 by Dr. Marlou Porch with pertinent cardiac history outlined to include CAD, diastolic dysfunction, DM, HTN, HLD. Last cath 2019 showed severe OM1 stenosis treated with PTCA/DES. He had severe stenosis very small caliber intermediate branch, unchanged from last cath and too small for PCI. He had nonobstructive disease in the RCA. Last echo 06/2020 EF 60-65%, grade 2 DD, mild AS. Also sees pulmonology with prior mild air trapping on PFTs. Also carries hx of granulomatosis with polyangiitis. He was previously cleared in 2019 to hold Plavix for 5 days for unrelated procedure.  I spoke with the patient who reports he has chronic dyspnea on exertion but also reports for the past 4-5 months he has sometimes had a sensation of chest pain "like angina" when this comes on. It happens about 1x/week, also described as though his "chest is swelling". Does not happen every time he exerts himself. No rest chest pain. He thinks this was present when he saw Dr. Marlou Porch in May 2022 but annot really recall. He did have a nuclear stress test in 07/2020 for chest pain which showed horizontal ST segment depression of 3 mm was noted during stress in the II, III, aVF, V4, V5 and V6 leads, beginning at 7 minutes of stress, and returning to baseline after 5-9 minutes of recovery, with no ischemia or infarction on perfusion images with normal wall motion.  Although colonoscopy is generally low risk, given intermittent CP with the request to hold Plavix, unable to clear based on protocol. Will route to Dr. Marlou Porch for review/advisement. Dr. Marlou Porch - Please route response to P CV DIV PREOP (the pre-op pool). Thank you.   Charlie Pitter,  PA-C 03/16/2021, 11:59 AM

## 2021-03-19 NOTE — Telephone Encounter (Signed)
    Patient Name: Brian Hudson  DOB: [birth year] MRN: [id number]  Primary Cardiologist: Candee Furbish, MD  Chart reviewed as part of pre-operative protocol coverage. Given past medical history and time since last visit, based on ACC/AHA guidelines, Brian Hudson would be at acceptable risk for the planned procedure without further cardiovascular testing.  Per Dr. Candee Furbish, "Okay to hold Plavix for 5 days prior to colonoscopy.  He may proceed.  Recent nuclear stress test was low risk."  The patient was advised that if he develops new symptoms prior to surgery to contact our office to arrange for a follow-up visit, and he verbalized understanding.  I will route this recommendation to the requesting party via Epic fax function and remove from pre-op pool.  Please call with questions.  [place], [place] 03/19/2021, 6:53 PM

## 2021-03-19 NOTE — Telephone Encounter (Signed)
Please inform the patient that Dr. Marlou Porch has cleared him for GI procedure.  He will need to hold the Plavix for 5 days prior to the procedure and restart as soon as possible afterward at the GI doctor's discretion.

## 2021-03-20 NOTE — Telephone Encounter (Signed)
I s/w the pt and he is aware that he has been cleared. Pt aware he will need to hold Plavix x 5 days prior to procedure. Pt aware he will resume Plavix once Dr. Watt Climes feels it is safe from a bleeding. Pt thanked me for the call and the help in this matter. I have assured the pt that I have faxed clearance notes to Dr. Watt Climes.

## 2021-03-22 DIAGNOSIS — E785 Hyperlipidemia, unspecified: Secondary | ICD-10-CM | POA: Diagnosis not present

## 2021-03-22 DIAGNOSIS — E1165 Type 2 diabetes mellitus with hyperglycemia: Secondary | ICD-10-CM | POA: Diagnosis not present

## 2021-03-22 DIAGNOSIS — E1122 Type 2 diabetes mellitus with diabetic chronic kidney disease: Secondary | ICD-10-CM | POA: Diagnosis not present

## 2021-03-22 DIAGNOSIS — I10 Essential (primary) hypertension: Secondary | ICD-10-CM | POA: Diagnosis not present

## 2021-03-27 DIAGNOSIS — K5730 Diverticulosis of large intestine without perforation or abscess without bleeding: Secondary | ICD-10-CM | POA: Diagnosis not present

## 2021-03-27 DIAGNOSIS — K921 Melena: Secondary | ICD-10-CM | POA: Diagnosis not present

## 2021-03-27 DIAGNOSIS — Z86010 Personal history of colonic polyps: Secondary | ICD-10-CM | POA: Diagnosis not present

## 2021-03-27 DIAGNOSIS — D123 Benign neoplasm of transverse colon: Secondary | ICD-10-CM | POA: Diagnosis not present

## 2021-03-27 DIAGNOSIS — K649 Unspecified hemorrhoids: Secondary | ICD-10-CM | POA: Diagnosis not present

## 2021-03-27 DIAGNOSIS — D124 Benign neoplasm of descending colon: Secondary | ICD-10-CM | POA: Diagnosis not present

## 2021-04-03 DIAGNOSIS — D123 Benign neoplasm of transverse colon: Secondary | ICD-10-CM | POA: Diagnosis not present

## 2021-04-03 DIAGNOSIS — D124 Benign neoplasm of descending colon: Secondary | ICD-10-CM | POA: Diagnosis not present

## 2021-04-05 DIAGNOSIS — I10 Essential (primary) hypertension: Secondary | ICD-10-CM | POA: Diagnosis not present

## 2021-04-26 ENCOUNTER — Encounter: Payer: Self-pay | Admitting: Internal Medicine

## 2021-04-26 ENCOUNTER — Other Ambulatory Visit: Payer: Self-pay

## 2021-04-26 ENCOUNTER — Ambulatory Visit: Payer: Medicare Other | Admitting: Internal Medicine

## 2021-04-26 VITALS — BP 120/68 | HR 60 | Temp 97.7°F | Ht 69.0 in | Wt 173.2 lb

## 2021-04-26 DIAGNOSIS — R0609 Other forms of dyspnea: Secondary | ICD-10-CM | POA: Diagnosis not present

## 2021-04-26 DIAGNOSIS — R911 Solitary pulmonary nodule: Secondary | ICD-10-CM | POA: Diagnosis not present

## 2021-04-26 DIAGNOSIS — I5189 Other ill-defined heart diseases: Secondary | ICD-10-CM | POA: Diagnosis not present

## 2021-04-26 DIAGNOSIS — Z8679 Personal history of other diseases of the circulatory system: Secondary | ICD-10-CM

## 2021-04-26 LAB — CBC WITH DIFFERENTIAL/PLATELET
Basophils Absolute: 0.1 10*3/uL (ref 0.0–0.1)
Basophils Relative: 1 % (ref 0.0–3.0)
Eosinophils Absolute: 0.3 10*3/uL (ref 0.0–0.7)
Eosinophils Relative: 4.8 % (ref 0.0–5.0)
HCT: 34.4 % — ABNORMAL LOW (ref 39.0–52.0)
Hemoglobin: 11.4 g/dL — ABNORMAL LOW (ref 13.0–17.0)
Lymphocytes Relative: 27.3 % (ref 12.0–46.0)
Lymphs Abs: 1.4 10*3/uL (ref 0.7–4.0)
MCHC: 33 g/dL (ref 30.0–36.0)
MCV: 84 fl (ref 78.0–100.0)
Monocytes Absolute: 0.4 10*3/uL (ref 0.1–1.0)
Monocytes Relative: 8 % (ref 3.0–12.0)
Neutro Abs: 3.1 10*3/uL (ref 1.4–7.7)
Neutrophils Relative %: 58.9 % (ref 43.0–77.0)
Platelets: 196 10*3/uL (ref 150.0–400.0)
RBC: 4.09 Mil/uL — ABNORMAL LOW (ref 4.22–5.81)
RDW: 14.8 % (ref 11.5–15.5)
WBC: 5.3 10*3/uL (ref 4.0–10.5)

## 2021-04-26 LAB — BASIC METABOLIC PANEL
BUN: 14 mg/dL (ref 6–23)
CO2: 23 mEq/L (ref 19–32)
Calcium: 8.8 mg/dL (ref 8.4–10.5)
Chloride: 103 mEq/L (ref 96–112)
Creatinine, Ser: 1.67 mg/dL — ABNORMAL HIGH (ref 0.40–1.50)
GFR: 40.74 mL/min — ABNORMAL LOW (ref >60.00)
Glucose, Bld: 308 mg/dL — ABNORMAL HIGH (ref 70–99)
Potassium: 4.2 mEq/L (ref 3.5–5.1)
Sodium: 135 mEq/L (ref 135–145)

## 2021-04-26 NOTE — Progress Notes (Signed)
Subjective:     Patient ID: Brian Hudson, male   DOB: [birth year], 71 y.o.   MRN: [id number]  HPI PCP Brian Huddle, MD   IOV 08/21/2016  Chief Complaint  Patient presents with   Pulm Consult    Referred by Brian Kindle PA-C for vasculitis.    71 year old male referred by Brian. Gavin Hudson and of physician assistant for evaluation of shortness of breath in the setting of previous diagnosis of Wegner's. There is very little outside information but in terms of his Wegener's granulomatosis he tells me that he was diagnosed with this in 2004 following significant sinus symptoms including bloody sinus drainage. This was at [facility]. He was treated for approximately one year with Cytoxan and subsequently on maintenance methotrexate. His last treatment for Wegener's was in 2008. Since then he's been on observation therapy. Treatment Back then  included prednisone as well. Then approximately 2 or 3 years ago he switched care to Brian. Gavin Hudson and rheumatology. According to him his been on observation therapy there as well. He tells me now for the last year or 2 he's been more fatigue associated with shortness of breath. He used to walk 2 miles a day but now is unable to do that. Dyspnea is present all the time but definitely more so with exertion and relieved by rest. There is associated cough and sometimes hemoptysis especially early in the morning but this is been ongoing for several years. He thinks his Wegener's might be active. He believes Brian. Gavin Hudson is done some blood work to look for vasculitis activity but I do not have those results with me. A CT scan of the chest was done that showed left upper lobe groundglass opacity and therefore he is been here. He denies any chest pain     FeNO 34ppb 08/21/2016 and slightly high  Walking desaturation test on 08/21/2016 185 feet x 3 laps on RA:  did NOT desaturate. Rest pulse ox was 100%, final pulse ox was 90. HR response  50/min at rest to 62/min at peak exertion.    IMPRESSION: CT scan of the chest personally visualized 1. No acute abnormality is seen on CT of the chest. 2. Coronary artery calcifications. 3. Minimal ground-glass opacity in the left upper lobe of doubtful significance. Initial follow-up with CT at 6-12 months is recommended to confirm persistence. If persistent, repeat CT is recommended every 2 years until 5 years of stability has been established. This recommendation follows the consensus statement: Guidelines for Management of Incidental Pulmonary Nodules Detected on CT Images: From the Fleischner Society 2017; Radiology 2017; 284:228-243.     Electronically Signed   By: Brian Hudson M.D.   On: 06/11/2016 17:14    09/11/2016 PFT' Follow Up: Brian Hudson is a 70 y.o. male former smoker quit in 1994 with history of Wegner's Disease since 2004, and dyspnea on exertion.  Pt. Was seen by Brian. Chase Hudson 08/21/2016 for  dyspnea .Plan at that time was as follows: PLAN  - start symbicort 2 puff twice daily - take sample worth 1 month  - do ono test room air next few to several days  - refer cardiology  - [facility] first available - sign record releast to get more info on wegner from Brian Brian Hudson esp blood work  - do full PFT  Patient presents today for follow-up. Patient has been compliant with Symbicort since 08/21/2016. He states he cannot tell any significant difference.  In his dyspnea. He states he continues to get short of breath at times with exercise and also when talking. He states his shortness of breath does not wake him from sleep. He continues to be unable to walk as he has done in the past. He was seen by Brian Hudson at [facility] heart care on 09/06/2016. Of note there is a significant family history which includes coronary artery disease in his father, who had MI at the age of 28.Risk factors include CAD, DM, HLD, + FH premature CAD, hx tobacco use and coronary calcifications on CT  of chest.The patient is Scheduled for an exercise stress test 6/14 and an event monitor 6/14 to rule out tachy bradycardia syndrome. He states he does have secretions in the morning, yellowish with occasional blood tinged secretions, that self resolves..Lab work has not been sent from Brian. Trudie Hudson In [place], therefore I am unable to review. Patient denies any fever, chest pain, orthopnea, or hemoptysis.   Test Results:  CT angiogram: 06/11/2016 No acute abnormality is seen on CT of the chest. 2. Coronary artery calcifications. 3. Minimal ground-glass opacity in the left upper lobe of doubtful significance. Initial follow-up with CT at 6-12 months is recommended to confirm persistence  Ambulatory saturation: 08/21/2016 185 feet 3 laps on room air. Did not desaturate. Resting pulse ox was 100%. Final pulse ox was 90%. Heart rate response 50/m at rest is 62/m at peak exertion.  EKG 09/06/2016 Sinus bradycardia with poor R-wave progression, heart rate 44   OV 03/24/2017 Chief Complaint  Patient presents with   Follow-up    CT scan 03/14/17.  Pt states that he still has some tightness in chest, has occ coughing, and is SOB all the time.  Pt also states that he has gotten choked several times while trying to eat.   Remote Wegner's granulomatosis in 2004 status post therapy. Remote smoking history  Follow-up shortness of breath: eing seen in pulmonary clinic for shortness of breath. Earlier this year we referred him to cardiology on basis of coronary artery calcification and strong family history. He status post coronary artery stent 2. After that his cardiac rehabilitation. Dyspnea is only some better he still has residual dyspnea  Follow-up left upper lobe lung nodule is a groundglass opacity. He had follow-up CT scan of the chest without contrast 03/14/2017. This is documented below. It appears the ground glass opacity persists but it is also "close to 2 cm. However he says that his  Brian Hudson is under remission. He did see Brian. Trudie Hudson earlier this year and apparently blood work was normal.  New issue: Is complaining of nonspecific dysphagia 4 times in the last 2 weeks. It is for solids. He is pretty significant. No associated vomiting or aspiration. His sister confirms the same.  IMPRESSION: ct chest 03/14/17 Persistent 12 x 19 mm ground-glass opacity in the central left upper Lobe. Adenocarcinoma cannot be excluded. Follow up by CT is recommended in 12 months, with continued annual surveillance for a minimum of 3 Years. These recommendations are taken from: Recommendations for the Management of Subsolid Pulmonary Nodules Detected at CT: A Statement from the [facility] 2013; 266:1, [phone].   Aortic Atherosclerosis (ICD10-I70.0).     Electronically Signed   By: Julian Hy M.D.   On: 03/14/2017 09:16   OV 04/29/2017  Chief Complaint  Patient presents with   Follow-up    PET scan done 04/11/17.  Pt has complaints of coughing (non-productive), shortness of breath  Follow-up shortness of breath in this patient with remote [place] history and remote smoking history Follow-up left lower lobe lung nodule  In terms of shortness of breath: He is compliant with his Symbicort for empiric asthma treatment.  He had coronary artery stents in the middle of 2018.  Despite this he is short of breath.  He says this frustrates him significantly.  He tells me that he is able to walk a block or climb steps without a problem or less of a problem.  And otherwise he does not been otherwise he does have some dyspnea on exertion for these activities.  But the significant amount of dyspnea happens when he is talking a lot.  He feels this is Wegener's dj vu again.  He did have Wegener's serology and did see Brian. Trudie Hudson in rheumatology end of 2018 and there is no evidence of Wegener's recurrence but he still feels this is white not measurable.  He has an appointment  upcoming with Brian. Candee Furbish his cardiologist.  In terms of left lower lobe lung nodule groundglass opacity: This was noted December 2018 CT chest.  He had a follow-up CT PET in January 2019 that showed this nodule was beginning to shrink.  His Wegner antibodies were negative/trace positive.  He has been reassured.  He requires a follow-up CT scan end of 2019.  New issue: Is complaining of chronic sinus discharge for months.  He feels is all Wegener's again.  I have repeatedly told him that so far we do not have evidence of recurrence.  OV 06/13/2017  Chief Complaint  Patient presents with   Follow-up    lung nodule, no SOB, no Wheezing, No chest tightness    Follow-up dyspnea: In terms of dyspnea he had a cardiopulmonary stress test that suggested coronary issues.  He then had a cardiac cath in February 2019 and the place a second coronary stent.  He tells me now that his dyspnea is better.  In fact when he talks he gets dyspnea and even this is better but he still has residual dyspnea.  He is trying to get into cardiac rehab and is waiting to hear from them.  There is no wheezing or cough  In terms of left lower lobe lung nodule groundglass opacity: He has follow-up scan pending in December 2019.  There are no other new issues.  OV 05/12/2020  Subjective:  Patient ID: Brian Hudson, male , DOB: 1951/01/14 , age 86 y.o. , MRN: [id number] , ADDRESS: [place] [address] PCP Brian Huddle, MD Patient Care Team: Brian Huddle, MD as PCP - General (Internal Medicine) Jerline Pain, MD as PCP - Cardiology (Cardiology)  This Provider for this visit: Treatment Team:  Attending Provider: Brand Males, MD    05/12/2020 -   Chief Complaint  Patient presents with   Follow-up    SOB getting worse.    Follow-up history of Wegener's with left lower lobe nodule -Follow-up remote smoking Follow-up dyspnea on exertion -Has associated coronary artery  disease.   HPI YASH CACCIOLA 71 y.o. -almost 3 years since I last saw him.  This is just under 3-year visit.  This is the further routine follow-up visit.  He tells me that in the last 3 years he had insidious onset of worsening shortness of breath.  This is accelerated in the last 6 months.  Talking makes it worse the worst is when he talks.  Although it is exertional when he  change his clothes and he climbs stairs.  He looks like of having a flat affect but it did not discuss any anxiety or depression in him.  He has not seen the cardiologist in a while.  He says he has upcoming appointment although I did not see it.  His cardiologist Brian. Candee Furbish.  He has a history of associated coronary artery disease and coronary artery stents but he denies any chest pain.  No orthopnea proximal nocturnal dyspnea or wheezing.  He does have a tickle in his throat when he lies down but otherwise no cough.  Last CT scan of the chest 2019 showing slight reduction in groundglass lesion in the left upper lobe.    CT Chest dat - dec 2019   IMPRESSION: 1. Stable to slightly smaller left upper lobe ground-glass lesion, measuring 9 x 18 mm today. Follow up by CT is recommended in 12 months, with continued annual surveillance for a minimum of 3 years. These recommendations are taken from: Recommendations for the Management of Subsolid Pulmonary Nodules Detected at CT: A Statement from the [facility] 2013; 266:1, 283-151. 2.  Aortic Atherosclerois (ICD10-170.0)     Electronically Signed   By: Misty Stanley M.D.   On: 03/11/2018 17:00     PFT  No flowsheet data found.      OV 06/28/2020  Subjective:  Patient ID: Brian Hudson, male , DOB: [birth year] , age 88 y.o. , MRN: [id number] , ADDRESS: [place] [id number] PCP Brian Huddle, MD Patient Care Team: Brian Huddle, MD as PCP - General (Internal Medicine) Jerline Pain, MD as PCP - Cardiology  (Cardiology)  This Provider for this visit: Treatment Team:  Attending Provider: Brand Males, MD    06/28/2020 -   Chief Complaint  Patient presents with   Follow-up    PFT performed today.  States he has been doing okay since last visit and denies any real complaints.   Follow-up history of Wegener's with left lower lobe nodule -Follow-up remote smoking Follow-up dyspnea on exertion -Has associated coronary artery disease.   HPI RICHAD RAMSAY 71 y.o. -presents for follow-up of his worsening dyspnea.  This visit is to review test results.  He tells me that overall he is stable still has this dyspnea on exertion although this time he tells me it is present on and off.  Test results show the CT scan just shows mild air trapping his lung nodule is stable in 2-3 years but is worse over the last 4 years.  His pulmonary function test is normal his nitric oxide test is normal.  His echocardiogram shows grade 2 diastolic dysfunction.  He denies any hypertension obesity.  He has not seen his cardiologist Brian. Candee Furbish despite assurance at the last visit that he would see him.  He has never done pulmonary rehabilitation.  We discussed several care options and he has agreed to start with pulmonary rehabilitation and reestablish with cardiology.   FeNO   - 18ppb 06/28/2020   CT chest 06/07/20   IMPRESSION: 1. No evidence of interstitial lung disease. 2. Irregular ground-glass nodule in the left upper lobe, stable from 03/11/2018 but possibly slightly increased in prominence from baseline 06/11/2016. Follow-up CT chest without contrast in 2 years is recommended. This recommendation follows the consensus statement: Guidelines for Management of Small Pulmonary Nodules Detected on CT Images: From the Fleischner Society 2017; Radiology 2017; 284:228-243. 3. Mild air trapping is indicative of small  airways disease. 4. Aortic atherosclerosis (ICD10-I70.0). Coronary artery calcification.      Electronically Signed   By: Lorin Picket M.D.   On: 06/09/2020 09:25      ECHO march 2022   IMPRESSIONS     1. Left ventricular ejection fraction, by estimation, is 60 to 65%. The  left ventricle has normal function. The left ventricle has no regional  wall motion abnormalities. There is mild left ventricular hypertrophy.  Left ventricular diastolic parameters  are consistent with Grade II diastolic dysfunction (pseudonormalization).   2. Right ventricular systolic function is normal. The right ventricular  size is normal. There is normal pulmonary artery systolic pressure. The  estimated right ventricular systolic pressure is 25.3 mmHg.   3. Left atrial size was mildly dilated.   4. The mitral valve is normal in structure. Trivial mitral valve  regurgitation. No evidence of mitral stenosis.   5. The aortic valve is tricuspid. Aortic valve regurgitation is not  visualized. Mild aortic valve stenosis. Aortic valve area, by VTI measures  1.71 cm. Aortic valve mean gradient measures 10.0 mmHg.   6. The inferior vena cava is normal in size with greater than 50%  respiratory variability, suggesting right atrial pressure of 3 mmHg.    OV 01/18/2021  Subjective:  Patient ID: Brian Hudson, male , DOB: December 26, 1950 , age 19 y.o. , MRN: [id number] , ADDRESS: [place] [address] PCP Brian Huddle, MD Patient Care Team: Brian Huddle, MD as PCP - General (Internal Medicine) Jerline Pain, MD as PCP - Cardiology (Cardiology)  This Provider for this visit: Treatment Team:  Attending Provider: Brand Males, MD    01/18/2021 -   Chief Complaint  Patient presents with   Follow-up    Pt states he has been doing okay since last visit. States he still becomes SOB with exertion and also has had an occ cough.   Follow-up history of Wegener's with left lower lobe nodule -Follow-up remote smoking Follow-up dyspnea on exertion -Has associated coronary  artery disease.and gr 2 ddx  HPI Brian Hudson 71 y.o. -presents with her sister for routine follow-up.  He continues to as always complain of worsening shortness of breath at each visit.  Again he is doing the same.  In the spring 2022 I referred him back to Brian. Candee Furbish because there was no ILD on the CT scan.  He has grade 2 diastolic dysfunction.  Brian. Marlou Porch is evaluated him.  Since then he seen [facility] physicians.  This month 2022 approximately around January 11, 2021 he had blood work.  Shows hemoglobin of 11 g%.  This is a says it is new onset anemia.  His creatinine was 1.46 mg percent.  This is consistent with the April 2022 baseline but worsened in 2019.  This is what it is Wagner's his back but he denies any hematuria or hemoptysis.  He has not had a urine analysis checked.  He is willing to have that done.  There is no pedal edema.  Nevertheless the sister thinks that he is genuinely worse in the past 1 week.  There is no syncope.  He takes empiric Arnuity [last visit we cut his Symbicort down to Arnuity].  There is no obvious emphysema on the CT.  I discussed about giving empiric trial with Spiriva he is willing to do that.  Is just to see if this would help his dyspnea because of the mild air trapping seen on CT chest  in spring 2022.  There is no cough or wheezing or fever or weight loss.    CT Chest data  No results found.     OV 04/26/2021  Subjective:  Patient ID: Brian Hudson, male , DOB: [birth year] , age 5 y.o. , MRN: [id number] , ADDRESS: [place] [id number] PCP Brian Huddle, MD Patient Care Team: Brian Huddle, MD as PCP - General (Internal Medicine) Jerline Pain, MD as PCP - Cardiology (Cardiology)  This Provider for this visit: Treatment Team:  Attending Provider: Brand Males, MD    04/26/2021 -   Chief Complaint  Patient presents with   Follow-up    Pt states he has been doing okay since last visit and denies any complaints.    Follow-up history of Wegener's with left lower lobe nodule -Follow-up remote smoking Follow-up dyspnea on exertion -Has associated coronary artery disease.and gr 2 ddx  HPI Brian Hudson 71 y.o. -returns for follow-up.  After seeing me last he says he is actually doing okay he has stable dyspnea on exertion when he does gardening.  Improves with rest.  He says he is learned to pace himself.  I do not have any recent blood work on him.  I think he has it done at [facility].  There is nothing in care everywhere.  He takes inhalers and he is stable.  At last visit his ANA and ANCA was positive.  I discussed with nurse practitioner to refer him back to Brian. Gavin Hudson his rheumatologist who he saw last for Wegener's some years ago.  He has not followed up with her.  He is willing to be referred to her again.  He is up-to-date with flu shot.  No new issues.    CT Chest data  No results found.    PFT  PFT Results Latest Ref Rng & Units 06/28/2020  FVC-Pre L 4.63  FVC-Predicted Pre % 114  FVC-Post L 4.72  FVC-Predicted Post % 116  Pre FEV1/FVC % % 75  Post FEV1/FCV % % 76  FEV1-Pre L 3.45  FEV1-Predicted Pre % 116  FEV1-Post L 3.57  DLCO uncorrected ml/min/mmHg 23.01  DLCO UNC% % 95  DLCO corrected ml/min/mmHg 23.01  DLCO COR %Predicted % 95  DLVA Predicted % 78  TLC L 7.05  TLC % Predicted % 106  RV % Predicted % 90       has a past medical history of Arthritis, CAD (coronary artery disease), Concussion, Depression, DM (diabetes mellitus) ([place]), Dyspnea, Dysrhythmia, ED (erectile dysfunction), Fracture (2010), GERD (gastroesophageal reflux disease), HTN (hypertension), Hyperlipidemia, IBS (irritable bowel syndrome), Palpitations, Peripheral vascular disease ([place]), Pneumonia, Prostate cancer ([place]) (2007), Sigmoid diverticulosis, and Wegener's granulomatosis.   reports that he quit smoking about 30 years ago. His smoking use included cigarettes. He started smoking about 61  years ago. He has a 77.50 pack-year smoking history. He has never used smokeless tobacco.  Past Surgical History:  Procedure Laterality Date   CARDIAC CATHETERIZATION     COLONOSCOPY N/A 06/24/2016   Procedure: COLONOSCOPY with propofol;  Surgeon: Garlan Fair, MD;  Location: [facility];  Service: Endoscopy;  Laterality: N/A;   colonscopy  2008   CORONARY STENT INTERVENTION N/A 09/20/2016   Procedure: Coronary Stent Intervention;  Surgeon: Leonie Man, MD;  Location: [place] [facility];  Service: Cardiovascular;  Laterality: N/A;   CORONARY STENT INTERVENTION N/A 05/23/2017   Procedure: CORONARY STENT INTERVENTION;  Surgeon: Burnell Blanks, MD;  Location: [place] [facility];  Service: Cardiovascular;  Laterality: N/A;   INSERTION PROSTATE RADIATION SEED  2007   INTRAVASCULAR PRESSURE WIRE/FFR STUDY N/A 09/20/2016   Procedure: Intravascular Pressure Wire/FFR Study;  Surgeon: Leonie Man, MD;  Location: [place] [facility];  Service: Cardiovascular;  Laterality: N/A;   LEFT HEART CATH AND CORONARY ANGIOGRAPHY N/A 09/20/2016   Procedure: Left Heart Cath and Coronary Angiography;  Surgeon: Leonie Man, MD;  Location: [place] [facility];  Service: Cardiovascular;  Laterality: N/A;   LEFT HEART CATH AND CORONARY ANGIOGRAPHY N/A 05/23/2017   Procedure: LEFT HEART CATH AND CORONARY ANGIOGRAPHY;  Surgeon: Burnell Blanks, MD;  Location: [place] [facility];  Service: Cardiovascular;  Laterality: N/A;   PARATHYROIDECTOMY Right 02/26/2018   Procedure: RIGHT INFERIOR PARATHYROIDECTOMY;  Surgeon: Armandina Gemma, MD;  Location: [facility];  Service: General;  Laterality: Right;   ROTATOR CUFF REPAIR Right     Allergies  Allergen Reactions   Codeine Other (See Comments)    "jittery"   Lipitor [Atorvastatin] Other (See Comments)    arthralgia   Zetia [Ezetimibe] Other (See Comments)    arthralgia   Zocor [Simvastatin] Other (See Comments)    arthralgia    Immunization  History  Administered Date(s) Administered   Influenza Split 01/24/2009, 01/19/2012, 01/18/2013   Influenza, High Dose Seasonal PF 03/09/2015, 01/06/2017, 01/22/2017, 01/08/2018, 01/20/2019, 03/01/2020, 03/14/2021   Influenza,inj,Quad PF,6+ Mos 01/18/2011   PFIZER(Purple Top)SARS-COV-2 Vaccination 12/09/2019, 12/31/2019   Pneumococcal Conjugate-13 03/09/2015   Pneumococcal Polysaccharide-23 02/19/2012, 01/08/2018   Td 04/09/1999   Tdap 05/03/2020   Zoster, Live 03/01/2020, 05/03/2020    Family History  Problem Relation Age of Onset   Heart attack Father 67   CAD Father    Diabetic kidney disease Mother    Healthy Sister    Cancer Brother    Healthy Sister      Current Outpatient Medications:    acetaminophen (TYLENOL) 325 MG tablet, Take 650 mg by mouth every morning., Disp: , Rfl:    amLODipine (NORVASC) 5 MG tablet, TAKE 1 TABLET(5 MG) BY MOUTH DAILY, Disp: 30 tablet, Rfl: 11   aspirin EC 81 MG tablet, Take 81 mg by mouth at bedtime., Disp: , Rfl:    buPROPion (ZYBAN) 150 MG 12 hr tablet, Take 150 mg by mouth every morning., Disp: , Rfl: 5   clopidogrel (PLAVIX) 75 MG tablet, TAKE 1 TABLET(75 MG) BY MOUTH DAILY, Disp: 30 tablet, Rfl: 8   escitalopram (LEXAPRO) 10 MG tablet, Take 10 mg by mouth daily., Disp: , Rfl:    Fluticasone Furoate (ARNUITY ELLIPTA) 100 MCG/ACT AEPB, Inhale 1 puff into the lungs daily., Disp: 30 each, Rfl: 5   glimepiride (AMARYL) 1 MG tablet, Take 1 mg by mouth daily with breakfast., Disp: , Rfl:    isosorbide mononitrate (IMDUR) 30 MG 24 hr tablet, Take 1 tablet (30 mg total) by mouth daily., Disp: 30 tablet, Rfl: 11   Multiple Vitamin (MULTIVITAMIN WITH MINERALS) TABS tablet, Take 1 tablet by mouth daily., Disp: , Rfl:    nitroGLYCERIN (NITROSTAT) 0.4 MG SL tablet, Place 1 tablet (0.4 mg total) under the tongue every 5 (five) minutes x 3 doses as needed for chest pain., Disp: 25 tablet, Rfl: 1   pantoprazole (PROTONIX) 40 MG tablet, Take 1 tablet (40 mg  total) by mouth 2 (two) times daily., Disp: 60 tablet, Rfl: 2   rosuvastatin (CRESTOR) 5 MG tablet, Take 10 mg by mouth daily. , Disp: , Rfl:  Tiotropium Bromide Monohydrate (SPIRIVA RESPIMAT) 2.5 MCG/ACT AERS, Inhale 2 puffs into the lungs daily., Disp: , Rfl:       Objective:   Vitals:   04/26/21 0914  BP: 120/68  Pulse: 60  Temp: 97.7 F (36.5 C)  TempSrc: Oral  SpO2: 100%  Weight: 173 lb 3.2 oz (78.6 kg)  Height: 5\' 9"  (1.753 m)    Estimated body mass index is 25.58 kg/m as calculated from the following:   Height as of this encounter: 5\' 9"  (1.753 m).   Weight as of this encounter: 173 lb 3.2 oz (78.6 kg).  @WEIGHTCHANGE @  [employer]   04/26/21 0914  Weight: 173 lb 3.2 oz (78.6 kg)     Physical Exam    General: No distress. Looks well Neuro: Alert and Oriented x 3. GCS 15. Speech normal Psych: Pleasant Resp:  Barrel Chest - no.  Wheeze - no, Crackles - no, No overt respiratory distress CVS: Normal heart sounds. Murmurs - no Ext: Stigmata of Connective Tissue Disease - no HEENT: Normal upper airway. PEERL +. No post nasal drip        Assessment:       ICD-10-CM   1. History of granulomatosis with polyangiitis  Z86.79     2. Dyspnea on exertion  R06.09     3. Nodule of left lung  R91.1     4. Grade II diastolic dysfunction  H67.59          Plan:     Patient Instructions  History of Wegener's granulomatosis none Nodule of left lung - last seen dec 2019 -> stable march 2022 (next followup March 2024) Hx of CAD Dyspnea on exertion - grade 2 diastolic dysfunction Mild air trapping on arnuity   -Shortness of breath mostl likely due to stiff heart muscle aka diastolic dysfunction, possible anemia - kidnely function worse compared to 2019 -.> 2022 - last visit ANA and ANCA positive   Plan  - continue arnuity and spiriva scheduled - check cbc, bmet , 04/26/2021 (no need if done last 3 months at [facility]) - refer back to Brian Brian Hudson  - 9 months  do spirometry and dlco  Follouwp  9 months or sooner if needed    SIGNATURE    Brian. Brand Males, M.D., F.C.C.P,  Pulmonary and Critical Care Medicine Staff Physician, [place] Director - Interstitial Lung Disease  Program  Pulmonary Study [place] [facility] [place], [place], [address]  Pager: [phone], If no answer or between  15:00h - 7:00h: call [phone] Telephone: [phone]  9:31 AM 04/26/2021

## 2021-04-26 NOTE — Patient Instructions (Addendum)
History of Wegener's granulomatosis none Nodule of left lung - last seen dec 2019 -> stable march 2022 (next followup March 2024) Hx of CAD Dyspnea on exertion - grade 2 diastolic dysfunction Mild air trapping on arnuity   -Shortness of breath mostl likely due to stiff heart muscle aka diastolic dysfunction, possible anemia - kidnely function worse compared to 2019 -.> 2022 - last visit ANA and ANCA positive   Plan  - continue arnuity and spiriva scheduled - check cbc, bmet , 04/26/2021 (no need if done last 3 months at [facility]) - refer back to [name]  - 9 months do spirometry and dlco  Follouwp  9 months or sooner if needed

## 2021-07-27 ENCOUNTER — Other Ambulatory Visit: Payer: Self-pay | Admitting: Cardiology

## 2021-08-01 ENCOUNTER — Telehealth: Payer: Self-pay | Admitting: Cardiology

## 2021-08-01 NOTE — Telephone Encounter (Signed)
[facility] called stating patient told them he wasn't taking clopidogrel '75mg'$ . According to there records patient should be taking it.  If patient shouldn't be on it they would like a call back. Otherwise no need to call them.  ?

## 2021-08-01 NOTE — Telephone Encounter (Signed)
Patient should be on Plavix.  ?

## 2021-08-03 DIAGNOSIS — I10 Essential (primary) hypertension: Secondary | ICD-10-CM | POA: Diagnosis not present

## 2021-08-03 DIAGNOSIS — E1165 Type 2 diabetes mellitus with hyperglycemia: Secondary | ICD-10-CM | POA: Diagnosis not present

## 2021-08-03 DIAGNOSIS — J449 Chronic obstructive pulmonary disease, unspecified: Secondary | ICD-10-CM | POA: Diagnosis not present

## 2021-08-03 DIAGNOSIS — E785 Hyperlipidemia, unspecified: Secondary | ICD-10-CM | POA: Diagnosis not present

## 2021-08-09 DIAGNOSIS — D224 Melanocytic nevi of scalp and neck: Secondary | ICD-10-CM | POA: Diagnosis not present

## 2021-08-09 DIAGNOSIS — D485 Neoplasm of uncertain behavior of skin: Secondary | ICD-10-CM | POA: Diagnosis not present

## 2021-08-09 DIAGNOSIS — D225 Melanocytic nevi of trunk: Secondary | ICD-10-CM | POA: Diagnosis not present

## 2021-08-09 DIAGNOSIS — L820 Inflamed seborrheic keratosis: Secondary | ICD-10-CM | POA: Diagnosis not present

## 2021-08-17 DIAGNOSIS — M1991 Primary osteoarthritis, unspecified site: Secondary | ICD-10-CM | POA: Diagnosis not present

## 2021-08-17 DIAGNOSIS — M3130 Wegener's granulomatosis without renal involvement: Secondary | ICD-10-CM | POA: Diagnosis not present

## 2021-08-17 DIAGNOSIS — M79642 Pain in left hand: Secondary | ICD-10-CM | POA: Diagnosis not present

## 2021-08-17 DIAGNOSIS — M79641 Pain in right hand: Secondary | ICD-10-CM | POA: Diagnosis not present

## 2021-08-17 DIAGNOSIS — R5383 Other fatigue: Secondary | ICD-10-CM | POA: Diagnosis not present

## 2021-08-23 DIAGNOSIS — I10 Essential (primary) hypertension: Secondary | ICD-10-CM | POA: Diagnosis not present

## 2021-08-23 DIAGNOSIS — K219 Gastro-esophageal reflux disease without esophagitis: Secondary | ICD-10-CM | POA: Diagnosis not present

## 2021-08-23 DIAGNOSIS — E785 Hyperlipidemia, unspecified: Secondary | ICD-10-CM | POA: Diagnosis not present

## 2021-08-23 DIAGNOSIS — E1165 Type 2 diabetes mellitus with hyperglycemia: Secondary | ICD-10-CM | POA: Diagnosis not present

## 2021-08-26 ENCOUNTER — Other Ambulatory Visit: Payer: Self-pay | Admitting: Cardiology

## 2021-09-11 DIAGNOSIS — L821 Other seborrheic keratosis: Secondary | ICD-10-CM | POA: Diagnosis not present

## 2021-09-11 DIAGNOSIS — D485 Neoplasm of uncertain behavior of skin: Secondary | ICD-10-CM | POA: Diagnosis not present

## 2021-09-11 DIAGNOSIS — L988 Other specified disorders of the skin and subcutaneous tissue: Secondary | ICD-10-CM | POA: Diagnosis not present

## 2021-09-20 DIAGNOSIS — N182 Chronic kidney disease, stage 2 (mild): Secondary | ICD-10-CM | POA: Diagnosis not present

## 2021-09-20 DIAGNOSIS — E1122 Type 2 diabetes mellitus with diabetic chronic kidney disease: Secondary | ICD-10-CM | POA: Diagnosis not present

## 2021-09-20 DIAGNOSIS — D631 Anemia in chronic kidney disease: Secondary | ICD-10-CM | POA: Diagnosis not present

## 2021-09-20 DIAGNOSIS — I129 Hypertensive chronic kidney disease with stage 1 through stage 4 chronic kidney disease, or unspecified chronic kidney disease: Secondary | ICD-10-CM | POA: Diagnosis not present

## 2021-09-20 DIAGNOSIS — N189 Chronic kidney disease, unspecified: Secondary | ICD-10-CM | POA: Diagnosis not present

## 2021-09-28 ENCOUNTER — Other Ambulatory Visit (HOSPITAL_COMMUNITY): Payer: Self-pay | Admitting: *Deleted

## 2021-10-01 ENCOUNTER — Ambulatory Visit (HOSPITAL_COMMUNITY)
Admission: RE | Admit: 2021-10-01 | Discharge: 2021-10-01 | Disposition: A | Discharge status: Home / Self Care | Payer: Medicare Other | Source: Ambulatory Visit | Attending: Nephrology | Admitting: Nephrology

## 2021-10-01 DIAGNOSIS — D631 Anemia in chronic kidney disease: Secondary | ICD-10-CM | POA: Diagnosis not present

## 2021-10-01 DIAGNOSIS — N189 Chronic kidney disease, unspecified: Secondary | ICD-10-CM | POA: Insufficient documentation

## 2021-10-01 MED ORDER — SODIUM CHLORIDE 0.9 % IV SOLN
510.0000 mg | INTRAVENOUS | Status: DC
  Administered 2021-10-01: 510 mg via INTRAVENOUS
  Filled 2021-10-01: qty 17

## 2021-10-05 DIAGNOSIS — E1165 Type 2 diabetes mellitus with hyperglycemia: Secondary | ICD-10-CM | POA: Diagnosis not present

## 2021-10-05 DIAGNOSIS — K219 Gastro-esophageal reflux disease without esophagitis: Secondary | ICD-10-CM | POA: Diagnosis not present

## 2021-10-05 DIAGNOSIS — I10 Essential (primary) hypertension: Secondary | ICD-10-CM | POA: Diagnosis not present

## 2021-10-05 DIAGNOSIS — E785 Hyperlipidemia, unspecified: Secondary | ICD-10-CM | POA: Diagnosis not present

## 2021-10-05 DIAGNOSIS — M25511 Pain in right shoulder: Secondary | ICD-10-CM | POA: Diagnosis not present

## 2021-10-08 ENCOUNTER — Ambulatory Visit (HOSPITAL_COMMUNITY)
Admission: RE | Admit: 2021-10-08 | Discharge: 2021-10-08 | Disposition: A | Discharge status: Home / Self Care | Payer: Medicare Other | Source: Ambulatory Visit | Attending: Nephrology | Admitting: Nephrology

## 2021-10-08 DIAGNOSIS — D631 Anemia in chronic kidney disease: Secondary | ICD-10-CM | POA: Diagnosis not present

## 2021-10-08 DIAGNOSIS — N189 Chronic kidney disease, unspecified: Secondary | ICD-10-CM | POA: Insufficient documentation

## 2021-10-08 MED ORDER — FERUMOXYTOL INJECTION 510 MG/17 ML
510.0000 mg | INTRAVENOUS | Status: AC
  Administered 2021-10-08: 510 mg via INTRAVENOUS
  Filled 2021-10-08: qty 510

## 2021-10-25 ENCOUNTER — Other Ambulatory Visit: Payer: Self-pay | Admitting: Cardiology

## 2021-11-02 ENCOUNTER — Ambulatory Visit: Payer: Medicare Other | Admitting: Cardiology

## 2021-11-02 ENCOUNTER — Encounter: Payer: Self-pay | Admitting: Cardiology

## 2021-11-02 VITALS — BP 90/40 | HR 60 | Ht 69.0 in | Wt 167.8 lb

## 2021-11-02 DIAGNOSIS — E785 Hyperlipidemia, unspecified: Secondary | ICD-10-CM | POA: Diagnosis not present

## 2021-11-02 DIAGNOSIS — I950 Idiopathic hypotension: Secondary | ICD-10-CM

## 2021-11-02 DIAGNOSIS — I10 Essential (primary) hypertension: Secondary | ICD-10-CM

## 2021-11-02 DIAGNOSIS — E782 Mixed hyperlipidemia: Secondary | ICD-10-CM

## 2021-11-02 DIAGNOSIS — I25119 Atherosclerotic heart disease of native coronary artery with unspecified angina pectoris: Secondary | ICD-10-CM

## 2021-11-02 DIAGNOSIS — E1165 Type 2 diabetes mellitus with hyperglycemia: Secondary | ICD-10-CM | POA: Diagnosis not present

## 2021-11-02 DIAGNOSIS — I5189 Other ill-defined heart diseases: Secondary | ICD-10-CM | POA: Diagnosis not present

## 2021-11-02 MED ORDER — NITROGLYCERIN 0.4 MG SL SUBL: 0.4000 mg | SUBLINGUAL_TABLET | SUBLINGUAL | 4 refills | Status: AC | PRN

## 2021-11-02 NOTE — Patient Instructions (Signed)
Medication Instructions:  Please discontinue your Amlodipine, Aspirin and Isosorbide. continue all other medications as listed.  *If you need a refill on your cardiac medications before your next appointment, please call your pharmacy*  Follow-Up: At [facility], you and your health needs are our priority.  As part of our continuing mission to provide you with exceptional heart care, we have created designated Provider Care Teams.  These Care Teams include your primary Cardiologist (physician) and Advanced Practice Providers (APPs -  Physician Assistants and Nurse Practitioners) who all work together to provide you with the care you need, when you need it.  We recommend signing up for the patient portal called "MyChart".  Sign up information is provided on this After Visit Summary.  MyChart is used to connect with patients for Virtual Visits (Telemedicine).  Patients are able to view lab/test results, encounter notes, upcoming appointments, etc.  Non-urgent messages can be sent to your provider as well.   To learn more about what you can do with MyChart, go to [email or web address].    Your next appointment:   2 week(s)  The format for your next appointment:   In Person  Provider:   Robbie Lis, PA-C, Nicholes Rough, PA-C, Melina Copa, PA-C, Ambrose Pancoast, NP, Ermalinda Barrios, PA-C, Christen Bame, NP, or Richardson Dopp, PA-C     {  Important Information About Sugar

## 2021-11-02 NOTE — Progress Notes (Signed)
Cardiology Office Note:    Date:  11/02/2021   ID:  Brian Hudson, DOB 19-Aug-1950, MRN [id number]  PCP:  Brian Huddle, MD   [facility] Providers Cardiologist:  Candee Furbish, MD     Referring MD: Brian Huddle, MD    History of Present Illness:    Brian Hudson is a 71 y.o. male here for follow-up coronary artery disease with prior drug-eluting stent placements in 2018 see below for details.  Dyspnea on exertion followed by pulmonary, Wegener's granulomatosis.  In the past dyspnea was felt to be in part diastolic dysfunction.  Fairly chronic.    Unsteady on feet. Shaky.90/40.  Had to lean against concrete pillar at [place].  Felt poor.  Blood pressure today quite low.  No syncope.  No chest pain.  He also tells me about the 4 different types of amlodipine he has at home, different pill shapes different colors.  No PND no orthopnea.  No exertional chest discomfort.  Past Medical History:  Diagnosis Date   Arthritis    osteoarthritis    CAD (coronary artery disease)    a. 09/20/16: 45% OM, 99% ostial ramus s/p DES, 80% proximal LCx s/p DES, 80% more distal LCx on AV groove portion of the circumflex after OM1 s/p cutting balloon and PCTA. // Ex Myoview 4/22: 9'20", 10.7 METs, BP 236/83 at pk stress, 3 mm ST depression inf-lat, no symptoms / EF 57, no ischemia or infarction; low risk     Concussion    Depression    DM (diabetes mellitus) ([place])    type 2   Dyspnea    with exertion    Dysrhythmia    hx of atrial fib- 15-20 years ago    ED (erectile dysfunction)    Fracture 2010   L 3 and L 4 healed with brace   GERD (gastroesophageal reflux disease)    HTN (hypertension)    Hyperlipidemia    IBS (irritable bowel syndrome)    Palpitations    none in last few yrs   Peripheral vascular disease ([place])    legs    Pneumonia    hx of    Prostate cancer ([place]) 2007   Sigmoid diverticulosis    Wegener's granulomatosis    followed by dr Berna Bue    Past Surgical  History:  Procedure Laterality Date   CARDIAC CATHETERIZATION     COLONOSCOPY N/A 06/24/2016   Procedure: COLONOSCOPY with propofol;  Surgeon: Garlan Fair, MD;  Location: [facility];  Service: Endoscopy;  Laterality: N/A;   colonscopy  2008   CORONARY STENT INTERVENTION N/A 09/20/2016   Procedure: Coronary Stent Intervention;  Surgeon: Leonie Man, MD;  Location: [place] [facility];  Service: Cardiovascular;  Laterality: N/A;   CORONARY STENT INTERVENTION N/A 05/23/2017   Procedure: CORONARY STENT INTERVENTION;  Surgeon: Burnell Blanks, MD;  Location: [place] [facility];  Service: Cardiovascular;  Laterality: N/A;   INSERTION PROSTATE RADIATION SEED  2007   INTRAVASCULAR PRESSURE WIRE/FFR STUDY N/A 09/20/2016   Procedure: Intravascular Pressure Wire/FFR Study;  Surgeon: Leonie Man, MD;  Location: [place] [facility];  Service: Cardiovascular;  Laterality: N/A;   LEFT HEART CATH AND CORONARY ANGIOGRAPHY N/A 09/20/2016   Procedure: Left Heart Cath and Coronary Angiography;  Surgeon: Leonie Man, MD;  Location: [place] [facility];  Service: Cardiovascular;  Laterality: N/A;   LEFT HEART CATH AND CORONARY ANGIOGRAPHY N/A 05/23/2017   Procedure: LEFT HEART CATH AND  CORONARY ANGIOGRAPHY;  Surgeon: Burnell Blanks, MD;  Location: [place] [facility];  Service: Cardiovascular;  Laterality: N/A;   PARATHYROIDECTOMY Right 02/26/2018   Procedure: RIGHT INFERIOR PARATHYROIDECTOMY;  Surgeon: Armandina Gemma, MD;  Location: [facility];  Service: General;  Laterality: Right;   ROTATOR CUFF REPAIR Right     Current Medications: Current Meds  Medication Sig   acetaminophen (TYLENOL) 325 MG tablet Take 650 mg by mouth every morning.   buPROPion (ZYBAN) 150 MG 12 hr tablet Take 150 mg by mouth every morning.   clopidogrel (PLAVIX) 75 MG tablet TAKE 1 TABLET(75 MG) BY MOUTH DAILY   empagliflozin (JARDIANCE) 25 MG TABS tablet Take 25 mg by mouth daily.   escitalopram (LEXAPRO) 10 MG  tablet Take 10 mg by mouth daily.   Fluticasone Furoate (ARNUITY ELLIPTA) 100 MCG/ACT AEPB Inhale 1 puff into the lungs daily.   glimepiride (AMARYL) 1 MG tablet Take 1 mg by mouth daily with breakfast.   Multiple Vitamin (MULTIVITAMIN WITH MINERALS) TABS tablet Take 1 tablet by mouth daily.   pantoprazole (PROTONIX) 40 MG tablet Take 1 tablet (40 mg total) by mouth 2 (two) times daily.   rosuvastatin (CRESTOR) 5 MG tablet Take 10 mg by mouth daily.    Semaglutide,0.25 or 0.5MG /DOS, (OZEMPIC, 0.25 OR 0.5 MG/DOSE,) 2 MG/3ML SOPN 0.25mg  weekly for 4 weeks then 0.5mg  weekly after that   SYMBICORT 160-4.5 MCG/ACT inhaler Inhale 2 puffs into the lungs 2 (two) times daily.   valsartan (DIOVAN) 80 MG tablet 1 tablet   [DISCONTINUED] amLODipine (NORVASC) 5 MG tablet TAKE 1 TABLET(5 MG) BY MOUTH DAILY   [DISCONTINUED] aspirin EC 81 MG tablet Take 81 mg by mouth at bedtime.   [DISCONTINUED] isosorbide mononitrate (IMDUR) 30 MG 24 hr tablet TAKE 1 TABLET(30 MG) BY MOUTH DAILY   [DISCONTINUED] nitroGLYCERIN (NITROSTAT) 0.4 MG SL tablet Place 1 tablet (0.4 mg total) under the tongue every 5 (five) minutes x 3 doses as needed for chest pain.     Allergies:   Atorvastatin, Codeine, Ezetimibe, and Zocor [simvastatin]   Social History   Socioeconomic History   Marital status: Divorced    Spouse name: Not on file   Number of children: Not on file   Years of education: Not on file   Highest education level: Not on file  Occupational History   Occupation: Contractor  Tobacco Use   Smoking status: Former    Packs/day: 2.50    Years: 31.00    Total pack years: 77.50    Types: Cigarettes    Start date: 1962    Quit date: 04/09/1991    Years since quitting: 30.5   Smokeless tobacco: Never  Vaping Use   Vaping Use: Never used  Substance and Sexual Activity   Alcohol use: No    Alcohol/week: 0.0 standard drinks of alcohol   Drug use: Never    Comment:   marijuana last used 2- weeks ago as of 06-20-16    Sexual activity: Not on file  Other Topics Concern   Not on file  Social History Narrative   Not on file   Social Determinants of Health   Financial Resource Strain: Not on file  Food Insecurity: Not on file  Transportation Needs: Not on file  Physical Activity: Inactive (07/08/2017)   Exercise Vital Sign    Days of Exercise per Week: 0 days    Minutes of Exercise per Session: 0 min  Stress: Stress Concern Present (07/08/2017)   [employer]  [employer]upational [employer] - Occupational Stress Questionnaire    Feeling of Stress : To some extent  Social Connections: Not on file     Family History: The patient's family history includes CAD in his father; Cancer in his brother; Diabetic kidney disease in his mother; Healthy in his sister and sister; Heart attack (age of onset: 27) in his father.  ROS:   Please see the history of present illness.     All other systems reviewed and are negative.  EKGs/Labs/Other Studies Reviewed:    The following studies were reviewed today: Nucelar Stress Test(07/21/20): The left ventricular ejection fraction is normal (55-65%). Nuclear stress EF: 57%. Blood pressure demonstrated a hypertensive response to exercise. This is a low risk study.   Exercise time: 9:20 min Workload: 10.7 METS, above average exercise capacity Test stopped due to: THR met BP response: hypertensive 236/83 mmHg with peak stress HR response: normal Rhythm: SR. ECG: horizontal ST segment depression of 3 mm was noted during stress in the II, III, aVF, V4, V5 and V6 leads, beginning at 7 minutes of stress, and returning to baseline after 5-9 minutes of recovery.   No ischemia or infarction on perfusion images. Normal wall motion.    ECHO(06/12/20):  1. Left ventricular ejection fraction, by estimation, is 60 to 65%. The  left ventricle has normal function. The left ventricle has no regional  wall motion abnormalities. There is mild left ventricular hypertrophy.  Left  ventricular diastolic parameters  are consistent with Grade II diastolic dysfunction (pseudonormalization).   2. Right ventricular systolic function is normal. The right ventricular  size is normal. There is normal pulmonary artery systolic pressure. The  estimated right ventricular systolic pressure is 71.2 mmHg.   3. Left atrial size was mildly dilated.   4. The mitral valve is normal in structure. Trivial mitral valve  regurgitation. No evidence of mitral stenosis.   5. The aortic valve is tricuspid. Aortic valve regurgitation is not  visualized. Mild aortic valve stenosis. Aortic valve area, by VTI measures  1.71 cm. Aortic valve mean gradient measures 10.0 mmHg.   6. The inferior vena cava is normal in size with greater than 50%  respiratory variability, suggesting right atrial pressure of 3 mmHg.    LEFT HEART CATH 05/23/2017 LAD ostial 30, distal 50; D1 ostial 50 LCx ostial 30, proximal stent patent, mid 40; RI 90; OM1 stent patent then 80, 50 RCA mid 20; RPAV 50 1. Non-obstructive diffuse disease in the LAD 2. Severe stenosis very small caliber intermediate branch. Unchanged from last cath and too small for PCI 3. Severe stenosis OM1 just beyond old stent. Successful PTCA/DES x 1 OM1. 4. Non-obstructive disease in the large dominant RCA 5. Normal LV systolic function.        Recent Labs: 04/26/2021: BUN 14; Creatinine, Ser 1.67; Hemoglobin 11.4; Platelets 196.0; Potassium 4.2; Sodium 135  Recent Lipid Panel    Component Value Date/Time   CHOL 122 10/02/2018 0845   TRIG 92 10/02/2018 0845   HDL 35 (L) 10/02/2018 0845   CHOLHDL 3.5 10/02/2018 0845   LDLCALC 69 10/02/2018 0845     Risk Assessment/Calculations:              Physical Exam:    VS:  BP (!) 90/40   Pulse 60   Ht _0  (1.753 m)   Wt 167 lb 12.8 oz (76.1 kg)   SpO2 97%   BMI 24.78 kg/m     Wt Readings from Last 3  Encounters:  11/02/21 167 lb 12.8 oz (76.1 kg)  04/26/21 173 lb 3.2 oz (78.6 kg)   01/25/21 169 lb 6.4 oz (76.8 kg)     GEN:  Well nourished, well developed in no acute distress HEENT: Normal NECK: No JVD; No carotid bruits LYMPHATICS: No lymphadenopathy CARDIAC: RRR, no murmurs, no rubs, gallops RESPIRATORY:  Clear to auscultation without rales, wheezing or rhonchi  ABDOMEN: Soft, non-tender, non-distended MUSCULOSKELETAL:  No edema; No deformity  SKIN: Warm and dry NEUROLOGIC:  Alert and oriented x 3 PSYCHIATRIC:  Normal affect   ASSESSMENT:    1. Coronary artery disease involving native heart with angina pectoris, unspecified vessel or lesion type ([place])   2. Diastolic dysfunction   3. Mixed hyperlipidemia   4. Essential hypertension   5. Idiopathic hypotension    PLAN:    In order of problems listed above:  Coronary artery disease - Obtuse marginal stenting in 2018, repeat stenting to the obtuse marginal subsequently.  Normal LVEF.  Low risk stress test.  He did have some ST segment depression and hypertensive response to exercise. -Continue with Plavix.  We will stop his aspirin 81. - Moderate diastolic dysfunction previously seen on echo.  Previously agree that shortness of breath may be a result of in part to diastolic dysfunction.   Pulmonary also seeing him.  Essential hypertension/hypotension - Blood pressure too low currently.  Symptomatic with this.   -Stop amlodipine 5 mg - Stop isosorbide 30 mg - Continuing with valsartan 80 mg.    mixed hyperlipidemia - Last LDL 82 in December 2022 outside labs.  Currently taking rosuvastatin 10 mg a day.  I would like to add Zetia 10 mg a day at a later date to help decrease his LDL less than 70.  For now since we are changing his blood pressure medications around we will hold off on this.  Chronic kidney disease stage IIIa - Has seen Dr. Posey Pronto with nephrology.  On valsartan.  Knows to avoid NSAIDs.       Medication Adjustments/Labs and Tests Ordered: Current medicines are reviewed at length with  the patient today.  Concerns regarding medicines are outlined above.  No orders of the defined types were placed in this encounter.  Meds ordered this encounter  Medications   nitroGLYCERIN (NITROSTAT) 0.4 MG SL tablet    Sig: Place 1 tablet (0.4 mg total) under the tongue every 5 (five) minutes x 3 doses as needed for chest pain.    Dispense:  25 tablet    Refill:  4    Patient Instructions  Medication Instructions:  Please discontinue your Amlodipine, Aspirin and Isosorbide. continue all other medications as listed.  *If you need a refill on your cardiac medications before your next appointment, please call your pharmacy*  Follow-Up: At [facility], you and your health needs are our priority.  As part of our continuing mission to provide you with exceptional heart care, we have created designated Provider Care Teams.  These Care Teams include your primary Cardiologist (physician) and Advanced Practice Providers (APPs -  Physician Assistants and Nurse Practitioners) who all work together to provide you with the care you need, when you need it.  We recommend signing up for the patient portal called "MyChart".  Sign up information is provided on this After Visit Summary.  MyChart is used to connect with patients for Virtual Visits (Telemedicine).  Patients are able to view lab/test results, encounter notes, upcoming appointments, etc.  Non-urgent messages can be  sent to your provider as well.   To learn more about what you can do with MyChart, go to [email or web address].    Your next appointment:   2 week(s)  The format for your next appointment:   In Person  Provider:   Robbie Lis, PA-C, Nicholes Rough, PA-C, Melina Copa, PA-C, Ambrose Pancoast, NP, Ermalinda Barrios, PA-C, Christen Bame, NP, or Richardson Dopp, PA-C     {  Important Information About Sugar         Signed, Candee Furbish, MD  11/02/2021 10:55 AM    Brian Hudson

## 2021-11-06 ENCOUNTER — Ambulatory Visit: Payer: Medicare Other | Admitting: Cardiology

## 2021-11-12 NOTE — Progress Notes (Signed)
Office Visit    Patient Name: CHANDRA FEGER Date of Encounter: 11/16/2021  Primary Care Provider:  Josetta Huddle, MD Primary Cardiologist:  Candee Furbish, MD Primary Electrophysiologist: None  Chief Complaint    Brian Hudson is a 71 y.o. male with PMH of CAD s/p DES to OM1 2018 with revision in 2019 of OM1 with new DES, HTN, HLD, DM, GERD, Wegener's granulomatosis who presents today for follow-up of hypotension and coronary artery disease.  Past Medical History    Past Medical History:  Diagnosis Date   Arthritis    osteoarthritis    CAD (coronary artery disease)    a. 09/20/16: 45% OM, 99% ostial ramus s/p DES, 80% proximal LCx s/p DES, 80% more distal LCx on AV groove portion of the circumflex after OM1 s/p cutting balloon and PCTA. // Ex Myoview 4/22: 9'20", 10.7 METs, BP 236/83 at pk stress, 3 mm ST depression inf-lat, no symptoms / EF 57, no ischemia or infarction; low risk     Concussion    Depression    DM (diabetes mellitus) ([place])    type 2   Dyspnea    with exertion    Dysrhythmia    hx of atrial fib- 15-20 years ago    ED (erectile dysfunction)    Fracture 2010   L 3 and L 4 healed with brace   GERD (gastroesophageal reflux disease)    HTN (hypertension)    Hyperlipidemia    IBS (irritable bowel syndrome)    Palpitations    none in last few yrs   Peripheral vascular disease ([place])    legs    Pneumonia    hx of    Prostate cancer ([place]) 2007   Sigmoid diverticulosis    Wegener's granulomatosis    followed by dr Berna Bue   Past Surgical History:  Procedure Laterality Date   CARDIAC CATHETERIZATION     COLONOSCOPY N/A 06/24/2016   Procedure: COLONOSCOPY with propofol;  Surgeon: Garlan Fair, MD;  Location: [facility];  Service: Endoscopy;  Laterality: N/A;   colonscopy  2008   CORONARY STENT INTERVENTION N/A 09/20/2016   Procedure: Coronary Stent Intervention;  Surgeon: Leonie Man, MD;  Location: [place] [facility];  Service: Cardiovascular;   Laterality: N/A;   CORONARY STENT INTERVENTION N/A 05/23/2017   Procedure: CORONARY STENT INTERVENTION;  Surgeon: Burnell Blanks, MD;  Location: [place] [facility];  Service: Cardiovascular;  Laterality: N/A;   INSERTION PROSTATE RADIATION SEED  2007   INTRAVASCULAR PRESSURE WIRE/FFR STUDY N/A 09/20/2016   Procedure: Intravascular Pressure Wire/FFR Study;  Surgeon: Leonie Man, MD;  Location: [place] [facility];  Service: Cardiovascular;  Laterality: N/A;   LEFT HEART CATH AND CORONARY ANGIOGRAPHY N/A 09/20/2016   Procedure: Left Heart Cath and Coronary Angiography;  Surgeon: Leonie Man, MD;  Location: [place] [facility];  Service: Cardiovascular;  Laterality: N/A;   LEFT HEART CATH AND CORONARY ANGIOGRAPHY N/A 05/23/2017   Procedure: LEFT HEART CATH AND CORONARY ANGIOGRAPHY;  Surgeon: Burnell Blanks, MD;  Location: [place] [facility];  Service: Cardiovascular;  Laterality: N/A;   PARATHYROIDECTOMY Right 02/26/2018   Procedure: RIGHT INFERIOR PARATHYROIDECTOMY;  Surgeon: Armandina Gemma, MD;  Location: [facility];  Service: General;  Laterality: Right;   ROTATOR CUFF REPAIR Right     Allergies  Allergies  Allergen Reactions   Atorvastatin Other (See Comments)    arthralgia Other reaction(s): arthralgias   Codeine Other (See Comments)    "  jittery"   Ezetimibe Other (See Comments)    arthralgia Other reaction(s): ? side effect   Zocor [Simvastatin] Other (See Comments)    arthralgia    History of Present Illness    SEQUOYAH COUNTERMAN is a 71 year old male with the above-mentioned past medical history who presents today for follow-up of hypotension.  Mr. Panepinto was seen by [name] in 2018 with complaint of shortness of breath.  CT of the chest revealed some coronary calcification.   A exercise Myoview was ordered as well as a 30 day event monitor even asymptomatic sinus bradycardia with heart rates in the 40s. His beta blocker was discontinued.He underwent nuclear stress  test that demonstrated abnormal ST segment depression with no perfusion defects.  He then underwent left heart cath that revealed 45% OM, 99% ostial ramus s/p DES, 80% proximal LCx s/p DES, 80% more distal LCx on AV groove portion of the circumflex after OM1 s/p cutting balloon and PCTA.  He was started on aspirin and Plavix.  He was seen in follow-up on 04/2017 and was having small episodes of anginal symptoms.  She was set up for cardiopulmonary stress test that revealed ST abnormalities and repeat LHC was ordered.  Cath showed nonobstructive CAD and LAD and RCA with severe stenosis and small caliber intermittent branch with DES placed x1 to OM1.  Patient had decreased anginal symptoms in 2-06/2018 when he endorses central chest pain when lying down.  He was started on torsemide 30 mg daily.  He was seen by Richardson Dopp, PA 07/2020 for follow-up and endorsed a couple episodes of angina.  He was sent for exercise Myoview revealed no ischemia and was low risk with good exercise capacity.  He was seen last by Dr. Marlou Porch and 10/2021 and was feeling poorly with hypotension and blood pressures 90/40.  He denied any anginal complaints.  He is amlodipine and isosorbide was discontinued and he continued his valsartan 80 mg.  Mr. Smolinsky reports today for follow-up with his sister.  His blood pressures are much improved since his last appointment at 126/68.  He denies any dizziness or fatigue since stopping his amlodipine and Imdur.  He is staying quite active and is currently helping a friend digging trenches.  He denies any chest pain or shortness of breath with exertion.  He walks in the park 6 laps and tolerates this activity without any shortness of breath or fatigue.  Patient denies chest pain, palpitations, dyspnea, PND, orthopnea, nausea, vomiting, dizziness, syncope, edema, weight gain, or early satiety.  Home Medications    Current Outpatient Medications  Medication Sig Dispense Refill   acetaminophen (TYLENOL)  325 MG tablet Take 650 mg by mouth every morning.     clopidogrel (PLAVIX) 75 MG tablet TAKE 1 TABLET(75 MG) BY MOUTH DAILY 30 tablet 0   empagliflozin (JARDIANCE) 25 MG TABS tablet Take 25 mg by mouth daily.     escitalopram (LEXAPRO) 10 MG tablet Take 10 mg by mouth daily.     Fluticasone Furoate (ARNUITY ELLIPTA) 100 MCG/ACT AEPB Inhale 1 puff into the lungs daily. 30 each 5   Multiple Vitamin (MULTIVITAMIN WITH MINERALS) TABS tablet Take 1 tablet by mouth daily.     nitroGLYCERIN (NITROSTAT) 0.4 MG SL tablet Place 1 tablet (0.4 mg total) under the tongue every 5 (five) minutes x 3 doses as needed for chest pain. 25 tablet 4   pantoprazole (PROTONIX) 40 MG tablet Take 1 tablet (40 mg total) by mouth 2 (two) times daily.  60 tablet 2   rosuvastatin (CRESTOR) 5 MG tablet Take 10 mg by mouth daily.      Semaglutide,0.25 or 0.'5MG'$ /DOS, (OZEMPIC, 0.25 OR 0.5 MG/DOSE,) 2 MG/3ML SOPN 0.'25mg'$  weekly for 4 weeks then 0.'5mg'$  weekly after that     SYMBICORT 160-4.5 MCG/ACT inhaler Inhale 2 puffs into the lungs 2 (two) times daily.     valsartan (DIOVAN) 80 MG tablet 1 tablet     No current facility-administered medications for this visit.     Review of Systems  Please see the history of present illness.    (+) Shortness of breath  All other systems reviewed and are otherwise negative except as noted above.  Physical Exam    Wt Readings from Last 3 Encounters:  11/16/21 168 lb 6.4 oz (76.4 kg)  11/02/21 167 lb 12.8 oz (76.1 kg)  04/26/21 173 lb 3.2 oz (78.6 kg)   VS: Vitals:   11/16/21 0804  BP: 126/68  Pulse: (!) 56  SpO2: 98%  ,Body mass index is 24.87 kg/m.  Constitutional:      Appearance: Healthy appearance. Not in distress.  Neck:     Vascular: JVD normal.  Pulmonary:     Effort: Pulmonary effort is normal.     Breath sounds: No wheezing. No rales. Diminished in the bases Cardiovascular:     Normal rate. Regular rhythm. Normal S1. Normal S2.      Murmurs: There is no murmur.   Edema:    Peripheral edema absent.  Abdominal:     Palpations: Abdomen is soft non tender. There is no hepatomegaly.  Skin:    General: Skin is warm and dry.  Neurological:     General: No focal deficit present.     Mental Status: Alert and oriented to person, place and time.     Cranial Nerves: Cranial nerves are intact.  EKG/LABS/Other Studies Reviewed    ECG personally reviewed by me today -sinus bradycardia with rate of 56 and normal axis with no acute changes.  Risk Assessment/Calculations:            Lab Results  Component Value Date   WBC 5.3 04/26/2021   HGB 11.4 (L) 04/26/2021   HCT 34.4 (L) 04/26/2021   MCV 84.0 04/26/2021   PLT 196.0 04/26/2021   Lab Results  Component Value Date   CREATININE 1.67 (H) 04/26/2021   BUN 14 04/26/2021   NA 135 04/26/2021   K 4.2 04/26/2021   CL 103 04/26/2021   CO2 23 04/26/2021   Lab Results  Component Value Date   ALT 23 10/02/2018   AST 30 10/02/2018   ALKPHOS 80 10/02/2018   BILITOT 0.3 10/02/2018   Lab Results  Component Value Date   CHOL 122 10/02/2018   HDL 35 (L) 10/02/2018   LDLCALC 69 10/02/2018   TRIG 92 10/02/2018   CHOLHDL 3.5 10/02/2018    No results found for: "HGBA1C"  Assessment & Plan    1.  Coronary artery disease: - s/p LHC in 2018 with DES to [place] completed 07/2020 with low risk and no ischemia. -Patient currently on GDMT with Plavix 75 mg, and Crestor 5 mg -Today patient reports no fatigue or shortness of breath.  He also denies any chest pain with activity  2.  Essential hypertension: -Blood pressures today are much improved at 126/68 -I advised him to continue his Diovan 80 mg daily and remain off of amlodipine and isosorbide. -I instructed him to recheck his blood pressures  and report blood pressures greater than 140/90  3.  Hyperlipidemia: -Last LDL was 82 on 03/2021 -Continue Crestor 5 mg as noted above  4.  History of prostate cancer /CKD stage IIIa: -Patient  advised to avoid NSAIDs and most recent creatinine was 1.6 on 04/2021 -Patient currently followed by nephrology  Disposition: Follow-up with Candee Furbish, MD or APP in 12 months    Medication Adjustments/Labs and Tests Ordered: Current medicines are reviewed at length with the patient today.  Concerns regarding medicines are outlined above.   Signed, Mable Fill, Marissa Nestle, NP 11/16/2021, 8:42 AM [place]

## 2021-11-15 DIAGNOSIS — E1165 Type 2 diabetes mellitus with hyperglycemia: Secondary | ICD-10-CM | POA: Diagnosis not present

## 2021-11-15 DIAGNOSIS — I10 Essential (primary) hypertension: Secondary | ICD-10-CM | POA: Diagnosis not present

## 2021-11-15 DIAGNOSIS — I2510 Atherosclerotic heart disease of native coronary artery without angina pectoris: Secondary | ICD-10-CM | POA: Diagnosis not present

## 2021-11-15 DIAGNOSIS — E785 Hyperlipidemia, unspecified: Secondary | ICD-10-CM | POA: Diagnosis not present

## 2021-11-16 ENCOUNTER — Ambulatory Visit: Payer: Medicare Other | Admitting: Nurse Practitioner

## 2021-11-16 ENCOUNTER — Encounter: Payer: Self-pay | Admitting: Nurse Practitioner

## 2021-11-16 VITALS — BP 126/68 | HR 56 | Ht 69.0 in | Wt 168.4 lb

## 2021-11-16 DIAGNOSIS — C61 Malignant neoplasm of prostate: Secondary | ICD-10-CM

## 2021-11-16 DIAGNOSIS — I2510 Atherosclerotic heart disease of native coronary artery without angina pectoris: Secondary | ICD-10-CM

## 2021-11-16 DIAGNOSIS — I10 Essential (primary) hypertension: Secondary | ICD-10-CM | POA: Diagnosis not present

## 2021-11-16 DIAGNOSIS — E782 Mixed hyperlipidemia: Secondary | ICD-10-CM | POA: Diagnosis not present

## 2021-11-16 NOTE — Patient Instructions (Signed)
Medication Instructions:  NO CHANGES *If you need a refill on your cardiac medications before your next appointment, please call your pharmacy*   Lab Work: NONE If you have labs (blood work) drawn today and your tests are completely normal, you will receive your results only by: [place] (if you have MyChart) OR A paper copy in the mail If you have any lab test that is abnormal or we need to change your treatment, we will call you to review the results.   Testing/Procedures: NONE   Follow-Up: At [facility], you and your health needs are our priority.  As part of our continuing mission to provide you with exceptional heart care, we have created designated Provider Care Teams.  These Care Teams include your primary Cardiologist (physician) and Advanced Practice Providers (APPs -  Physician Assistants and Nurse Practitioners) who all work together to provide you with the care you need, when you need it.  We recommend signing up for the patient portal called "MyChart".  Sign up information is provided on this After Visit Summary.  MyChart is used to connect with patients for Virtual Visits (Telemedicine).  Patients are able to view lab/test results, encounter notes, upcoming appointments, etc.  Non-urgent messages can be sent to your provider as well.   To learn more about what you can do with MyChart, go to [email or web address].    Your next appointment:   AS PLANNED  The format for your next appointment:     Provider:       Other Instructions NONE  Important Information About [place]

## 2021-11-24 ENCOUNTER — Other Ambulatory Visit: Payer: Self-pay | Admitting: Cardiology

## 2021-11-26 ENCOUNTER — Other Ambulatory Visit: Payer: Self-pay

## 2021-11-26 MED ORDER — CLOPIDOGREL BISULFATE 75 MG PO TABS: ORAL_TABLET | ORAL | 3 refills | Status: DC

## 2021-12-28 DIAGNOSIS — I2510 Atherosclerotic heart disease of native coronary artery without angina pectoris: Secondary | ICD-10-CM | POA: Diagnosis not present

## 2021-12-28 DIAGNOSIS — E785 Hyperlipidemia, unspecified: Secondary | ICD-10-CM | POA: Diagnosis not present

## 2021-12-28 DIAGNOSIS — I10 Essential (primary) hypertension: Secondary | ICD-10-CM | POA: Diagnosis not present

## 2021-12-28 DIAGNOSIS — E1165 Type 2 diabetes mellitus with hyperglycemia: Secondary | ICD-10-CM | POA: Diagnosis not present

## 2022-01-07 DIAGNOSIS — N182 Chronic kidney disease, stage 2 (mild): Secondary | ICD-10-CM | POA: Diagnosis not present

## 2022-01-16 DIAGNOSIS — D631 Anemia in chronic kidney disease: Secondary | ICD-10-CM | POA: Diagnosis not present

## 2022-01-16 DIAGNOSIS — I129 Hypertensive chronic kidney disease with stage 1 through stage 4 chronic kidney disease, or unspecified chronic kidney disease: Secondary | ICD-10-CM | POA: Diagnosis not present

## 2022-01-16 DIAGNOSIS — E1122 Type 2 diabetes mellitus with diabetic chronic kidney disease: Secondary | ICD-10-CM | POA: Diagnosis not present

## 2022-01-16 DIAGNOSIS — N1831 Chronic kidney disease, stage 3a: Secondary | ICD-10-CM | POA: Diagnosis not present

## 2022-01-28 DIAGNOSIS — H52203 Unspecified astigmatism, bilateral: Secondary | ICD-10-CM | POA: Diagnosis not present

## 2022-01-28 DIAGNOSIS — H5213 Myopia, bilateral: Secondary | ICD-10-CM | POA: Diagnosis not present

## 2022-01-28 DIAGNOSIS — E119 Type 2 diabetes mellitus without complications: Secondary | ICD-10-CM | POA: Diagnosis not present

## 2022-01-28 DIAGNOSIS — H2513 Age-related nuclear cataract, bilateral: Secondary | ICD-10-CM | POA: Diagnosis not present

## 2022-01-29 DIAGNOSIS — H524 Presbyopia: Secondary | ICD-10-CM | POA: Diagnosis not present

## 2022-03-11 DIAGNOSIS — E1165 Type 2 diabetes mellitus with hyperglycemia: Secondary | ICD-10-CM | POA: Diagnosis not present

## 2022-03-11 DIAGNOSIS — I10 Essential (primary) hypertension: Secondary | ICD-10-CM | POA: Diagnosis not present

## 2022-03-11 DIAGNOSIS — E785 Hyperlipidemia, unspecified: Secondary | ICD-10-CM | POA: Diagnosis not present

## 2022-03-11 DIAGNOSIS — K219 Gastro-esophageal reflux disease without esophagitis: Secondary | ICD-10-CM | POA: Diagnosis not present

## 2022-03-12 ENCOUNTER — Telehealth: Payer: Self-pay | Admitting: Internal Medicine

## 2022-03-13 NOTE — Telephone Encounter (Signed)
Called and spoke with patient's sister, Wallie Char [facility]), she wanted clarification as to what inhalers he should be using.  I advised her that as of his last OV with [name] on 04/26/2021 he was to be using Arnuity and spiriva.  She asked if anything had changed since his breathing test in September of this year.  I let her know that he was supposed to have a 30 minute breathing test (spirometry and DLCO) with an OV 9 months after his last visit, however, they were not done or scheduled.  I offered to schedule them while she was on the phone, however, she said she had too much going on right now.  I advised her to call the office when it was better for them and we would be glad to schedule the 30 minute PFT and the OV with [name].  She verbalized understanding.  Nothing further needed.

## 2022-04-29 DIAGNOSIS — E1165 Type 2 diabetes mellitus with hyperglycemia: Secondary | ICD-10-CM | POA: Diagnosis not present

## 2022-04-29 DIAGNOSIS — I10 Essential (primary) hypertension: Secondary | ICD-10-CM | POA: Diagnosis not present

## 2022-04-29 DIAGNOSIS — K219 Gastro-esophageal reflux disease without esophagitis: Secondary | ICD-10-CM | POA: Diagnosis not present

## 2022-04-29 DIAGNOSIS — E785 Hyperlipidemia, unspecified: Secondary | ICD-10-CM | POA: Diagnosis not present

## 2022-05-02 DIAGNOSIS — E785 Hyperlipidemia, unspecified: Secondary | ICD-10-CM | POA: Diagnosis not present

## 2022-05-02 DIAGNOSIS — Z8546 Personal history of malignant neoplasm of prostate: Secondary | ICD-10-CM | POA: Diagnosis not present

## 2022-05-02 DIAGNOSIS — Z0000 Encounter for general adult medical examination without abnormal findings: Secondary | ICD-10-CM | POA: Diagnosis not present

## 2022-05-02 DIAGNOSIS — Z1331 Encounter for screening for depression: Secondary | ICD-10-CM | POA: Diagnosis not present

## 2022-05-02 DIAGNOSIS — F322 Major depressive disorder, single episode, severe without psychotic features: Secondary | ICD-10-CM | POA: Diagnosis not present

## 2022-05-02 DIAGNOSIS — D509 Iron deficiency anemia, unspecified: Secondary | ICD-10-CM | POA: Diagnosis not present

## 2022-05-02 DIAGNOSIS — Z23 Encounter for immunization: Secondary | ICD-10-CM | POA: Diagnosis not present

## 2022-05-02 DIAGNOSIS — I10 Essential (primary) hypertension: Secondary | ICD-10-CM | POA: Diagnosis not present

## 2022-05-06 ENCOUNTER — Ambulatory Visit
Admission: RE | Admit: 2022-05-06 | Discharge: 2022-05-06 | Disposition: A | Discharge status: Home / Self Care | Payer: Medicare Other | Source: Ambulatory Visit | Attending: Internal Medicine | Admitting: Internal Medicine

## 2022-05-06 ENCOUNTER — Other Ambulatory Visit: Payer: Self-pay | Admitting: Internal Medicine

## 2022-05-06 DIAGNOSIS — M25512 Pain in left shoulder: Secondary | ICD-10-CM

## 2022-06-03 DIAGNOSIS — J449 Chronic obstructive pulmonary disease, unspecified: Secondary | ICD-10-CM | POA: Diagnosis not present

## 2022-06-03 DIAGNOSIS — E7800 Pure hypercholesterolemia, unspecified: Secondary | ICD-10-CM | POA: Diagnosis not present

## 2022-06-03 DIAGNOSIS — E1165 Type 2 diabetes mellitus with hyperglycemia: Secondary | ICD-10-CM | POA: Diagnosis not present

## 2022-06-03 DIAGNOSIS — I10 Essential (primary) hypertension: Secondary | ICD-10-CM | POA: Diagnosis not present

## 2022-07-21 ENCOUNTER — Inpatient Hospital Stay (HOSPITAL_COMMUNITY): Payer: Medicare Other

## 2022-07-21 ENCOUNTER — Encounter (HOSPITAL_COMMUNITY)
Admission: EM | Disposition: A | Discharge status: Home / Self Care | Payer: Self-pay | Source: Home / Self Care | Attending: Internal Medicine

## 2022-07-21 ENCOUNTER — Inpatient Hospital Stay (HOSPITAL_COMMUNITY)
Admission: EM | Admit: 2022-07-21 | Discharge: 2022-07-22 | DRG: 322 | Disposition: A | Discharge status: Home / Self Care | Payer: Medicare Other | Attending: Internal Medicine | Admitting: Internal Medicine

## 2022-07-21 DIAGNOSIS — I213 ST elevation (STEMI) myocardial infarction of unspecified site: Secondary | ICD-10-CM

## 2022-07-21 DIAGNOSIS — F32A Depression, unspecified: Secondary | ICD-10-CM | POA: Diagnosis present

## 2022-07-21 DIAGNOSIS — Y840 Cardiac catheterization as the cause of abnormal reaction of the patient, or of later complication, without mention of misadventure at the time of the procedure: Secondary | ICD-10-CM | POA: Diagnosis not present

## 2022-07-21 DIAGNOSIS — I252 Old myocardial infarction: Secondary | ICD-10-CM

## 2022-07-21 DIAGNOSIS — Z7951 Long term (current) use of inhaled steroids: Secondary | ICD-10-CM

## 2022-07-21 DIAGNOSIS — R079 Chest pain, unspecified: Secondary | ICD-10-CM | POA: Diagnosis not present

## 2022-07-21 DIAGNOSIS — Z7902 Long term (current) use of antithrombotics/antiplatelets: Secondary | ICD-10-CM | POA: Diagnosis not present

## 2022-07-21 DIAGNOSIS — Z885 Allergy status to narcotic agent status: Secondary | ICD-10-CM

## 2022-07-21 DIAGNOSIS — I2119 ST elevation (STEMI) myocardial infarction involving other coronary artery of inferior wall: Principal | ICD-10-CM | POA: Diagnosis present

## 2022-07-21 DIAGNOSIS — M1990 Unspecified osteoarthritis, unspecified site: Secondary | ICD-10-CM | POA: Diagnosis not present

## 2022-07-21 DIAGNOSIS — E892 Postprocedural hypoparathyroidism: Secondary | ICD-10-CM | POA: Diagnosis present

## 2022-07-21 DIAGNOSIS — Z7985 Long-term (current) use of injectable non-insulin antidiabetic drugs: Secondary | ICD-10-CM | POA: Diagnosis not present

## 2022-07-21 DIAGNOSIS — E1151 Type 2 diabetes mellitus with diabetic peripheral angiopathy without gangrene: Secondary | ICD-10-CM | POA: Diagnosis not present

## 2022-07-21 DIAGNOSIS — L7632 Postprocedural hematoma of skin and subcutaneous tissue following other procedure: Secondary | ICD-10-CM | POA: Diagnosis not present

## 2022-07-21 DIAGNOSIS — I129 Hypertensive chronic kidney disease with stage 1 through stage 4 chronic kidney disease, or unspecified chronic kidney disease: Secondary | ICD-10-CM | POA: Diagnosis not present

## 2022-07-21 DIAGNOSIS — Z87891 Personal history of nicotine dependence: Secondary | ICD-10-CM

## 2022-07-21 DIAGNOSIS — Z79899 Other long term (current) drug therapy: Secondary | ICD-10-CM

## 2022-07-21 DIAGNOSIS — Z955 Presence of coronary angioplasty implant and graft: Secondary | ICD-10-CM

## 2022-07-21 DIAGNOSIS — R0689 Other abnormalities of breathing: Secondary | ICD-10-CM | POA: Diagnosis not present

## 2022-07-21 DIAGNOSIS — R0789 Other chest pain: Secondary | ICD-10-CM | POA: Diagnosis not present

## 2022-07-21 DIAGNOSIS — I10 Essential (primary) hypertension: Secondary | ICD-10-CM | POA: Diagnosis present

## 2022-07-21 DIAGNOSIS — Z8546 Personal history of malignant neoplasm of prostate: Secondary | ICD-10-CM | POA: Diagnosis not present

## 2022-07-21 DIAGNOSIS — E1122 Type 2 diabetes mellitus with diabetic chronic kidney disease: Secondary | ICD-10-CM | POA: Diagnosis not present

## 2022-07-21 DIAGNOSIS — M3130 Wegener's granulomatosis without renal involvement: Secondary | ICD-10-CM | POA: Diagnosis present

## 2022-07-21 DIAGNOSIS — N183 Chronic kidney disease, stage 3 unspecified: Secondary | ICD-10-CM | POA: Diagnosis present

## 2022-07-21 DIAGNOSIS — E119 Type 2 diabetes mellitus without complications: Secondary | ICD-10-CM

## 2022-07-21 DIAGNOSIS — K219 Gastro-esophageal reflux disease without esophagitis: Secondary | ICD-10-CM | POA: Diagnosis present

## 2022-07-21 DIAGNOSIS — K589 Irritable bowel syndrome without diarrhea: Secondary | ICD-10-CM | POA: Diagnosis not present

## 2022-07-21 DIAGNOSIS — I2510 Atherosclerotic heart disease of native coronary artery without angina pectoris: Secondary | ICD-10-CM | POA: Diagnosis not present

## 2022-07-21 DIAGNOSIS — R0602 Shortness of breath: Secondary | ICD-10-CM | POA: Diagnosis not present

## 2022-07-21 DIAGNOSIS — Z87820 Personal history of traumatic brain injury: Secondary | ICD-10-CM | POA: Diagnosis not present

## 2022-07-21 DIAGNOSIS — Z7984 Long term (current) use of oral hypoglycemic drugs: Secondary | ICD-10-CM

## 2022-07-21 DIAGNOSIS — E785 Hyperlipidemia, unspecified: Secondary | ICD-10-CM | POA: Diagnosis not present

## 2022-07-21 DIAGNOSIS — Z888 Allergy status to other drugs, medicaments and biological substances status: Secondary | ICD-10-CM

## 2022-07-21 DIAGNOSIS — I2111 ST elevation (STEMI) myocardial infarction involving right coronary artery: Secondary | ICD-10-CM | POA: Diagnosis not present

## 2022-07-21 HISTORY — PX: LEFT HEART CATH AND CORONARY ANGIOGRAPHY: CATH118249

## 2022-07-21 HISTORY — PX: CORONARY/GRAFT ACUTE MI REVASCULARIZATION: CATH118305

## 2022-07-21 LAB — COMPREHENSIVE METABOLIC PANEL
ALT: 28 U/L (ref 0–44)
AST: 30 U/L (ref 15–41)
Albumin: 3.8 g/dL (ref 3.5–5.0)
Alkaline Phosphatase: 75 U/L (ref 38–126)
Anion gap: 13 (ref 5–15)
BUN: 16 mg/dL (ref 8–23)
CO2: 19 mmol/L — ABNORMAL LOW (ref 22–32)
Calcium: 8.9 mg/dL (ref 8.9–10.3)
Chloride: 105 mmol/L (ref 98–111)
Creatinine, Ser: 1.63 mg/dL — ABNORMAL HIGH (ref 0.61–1.24)
GFR, Estimated: 44 mL/min — ABNORMAL LOW (ref >60)
Glucose, Bld: 223 mg/dL — ABNORMAL HIGH (ref 70–99)
Potassium: 3.6 mmol/L (ref 3.5–5.1)
Sodium: 137 mmol/L (ref 135–145)
Total Bilirubin: 0.8 mg/dL (ref 0.3–1.2)
Total Protein: 7.6 g/dL (ref 6.5–8.1)

## 2022-07-21 LAB — MRSA NEXT GEN BY PCR, NASAL: MRSA by PCR Next Gen: NOT DETECTED

## 2022-07-21 LAB — CBC WITH DIFFERENTIAL/PLATELET
Abs Immature Granulocytes: 0.08 10*3/uL — ABNORMAL HIGH (ref 0.00–0.07)
Basophils Absolute: 0.1 10*3/uL (ref 0.0–0.1)
Basophils Relative: 0 %
Eosinophils Absolute: 0.1 10*3/uL (ref 0.0–0.5)
Eosinophils Relative: 1 %
HCT: 42.3 % (ref 39.0–52.0)
Hemoglobin: 14.5 g/dL (ref 13.0–17.0)
Immature Granulocytes: 1 %
Lymphocytes Relative: 11 %
Lymphs Abs: 1.7 10*3/uL (ref 0.7–4.0)
MCH: 30 pg (ref 26.0–34.0)
MCHC: 34.3 g/dL (ref 30.0–36.0)
MCV: 87.6 fL (ref 80.0–100.0)
Monocytes Absolute: 0.9 10*3/uL (ref 0.1–1.0)
Monocytes Relative: 6 %
Neutro Abs: 12.2 10*3/uL — ABNORMAL HIGH (ref 1.7–7.7)
Neutrophils Relative %: 81 %
Platelets: 206 10*3/uL (ref 150–400)
RBC: 4.83 MIL/uL (ref 4.22–5.81)
RDW: 13.2 % (ref 11.5–15.5)
WBC: 15 10*3/uL — ABNORMAL HIGH (ref 4.0–10.5)
nRBC: 0 % (ref 0.0–0.2)

## 2022-07-21 LAB — POCT I-STAT EG7
Acid-base deficit: 1 mmol/L (ref 0.0–2.0)
Bicarbonate: 20.2 mmol/L (ref 20.0–28.0)
Calcium, Ion: 1.1 mmol/L — ABNORMAL LOW (ref 1.15–1.40)
HCT: 43 % (ref 39.0–52.0)
Hemoglobin: 14.6 g/dL (ref 13.0–17.0)
O2 Saturation: 26 %
Potassium: 3.6 mmol/L (ref 3.5–5.1)
Sodium: 139 mmol/L (ref 135–145)
TCO2: 21 mmol/L — ABNORMAL LOW (ref 22–32)
pCO2, Ven: 25.5 mmHg — ABNORMAL LOW (ref 44–60)
pH, Ven: 7.506 — ABNORMAL HIGH (ref 7.25–7.43)
pO2, Ven: 16 mmHg — CL (ref 32–45)

## 2022-07-21 LAB — POCT I-STAT, CHEM 8
BUN: 17 mg/dL (ref 8–23)
Calcium, Ion: 1.1 mmol/L — ABNORMAL LOW (ref 1.15–1.40)
Chloride: 104 mmol/L (ref 98–111)
Creatinine, Ser: 1.5 mg/dL — ABNORMAL HIGH (ref 0.61–1.24)
Glucose, Bld: 218 mg/dL — ABNORMAL HIGH (ref 70–99)
HCT: 44 % (ref 39.0–52.0)
Hemoglobin: 15 g/dL (ref 13.0–17.0)
Potassium: 3.6 mmol/L (ref 3.5–5.1)
Sodium: 140 mmol/L (ref 135–145)
TCO2: 20 mmol/L — ABNORMAL LOW (ref 22–32)

## 2022-07-21 LAB — ECHOCARDIOGRAM COMPLETE
AR max vel: 1.51 cm2
AV Area VTI: 1.67 cm2
AV Area mean vel: 1.35 cm2
AV Mean grad: 7 mmHg
AV Peak grad: 12.7 mmHg
Ao pk vel: 1.78 m/s
Area-P 1/2: 2.61 cm2
Height: 69 in
S' Lateral: 3.3 cm
Single Plane A4C EF: 66.7 %
Weight: 2688 oz

## 2022-07-21 LAB — LIPID PANEL
Cholesterol: 142 mg/dL (ref 0–200)
HDL: 37 mg/dL — ABNORMAL LOW (ref >40)
LDL Cholesterol: 87 mg/dL (ref 0–99)
Total CHOL/HDL Ratio: 3.8 RATIO
Triglycerides: 89 mg/dL (ref <150)
VLDL: 18 mg/dL (ref 0–40)

## 2022-07-21 LAB — TROPONIN I (HIGH SENSITIVITY)
Troponin I (High Sensitivity): 103 ng/L (ref <18)
Troponin I (High Sensitivity): 1501 ng/L (ref <18)

## 2022-07-21 LAB — APTT: aPTT: 29 seconds (ref 24–36)

## 2022-07-21 LAB — POCT ACTIVATED CLOTTING TIME
Activated Clotting Time: 266 seconds
Activated Clotting Time: 250 seconds

## 2022-07-21 LAB — HEMOGLOBIN A1C
Hgb A1c MFr Bld: 7.4 % — ABNORMAL HIGH (ref 4.8–5.6)
Mean Plasma Glucose: 165.68 mg/dL

## 2022-07-21 LAB — PROTIME-INR
INR: 1.1 (ref 0.8–1.2)
Prothrombin Time: 14.5 seconds (ref 11.4–15.2)

## 2022-07-21 SURGERY — CORONARY/GRAFT ACUTE MI REVASCULARIZATION
Anesthesia: LOCAL

## 2022-07-21 MED ORDER — MIDAZOLAM HCL 2 MG/2ML IJ SOLN
INTRAMUSCULAR | Status: DC | PRN
  Administered 2022-07-21: 1 mg via INTRAVENOUS

## 2022-07-21 MED ORDER — FUROSEMIDE 10 MG/ML IJ SOLN
40.0000 mg | Freq: Every day | INTRAMUSCULAR | Status: DC
  Administered 2022-07-21 – 2022-07-22 (×2): 40 mg via INTRAVENOUS
  Filled 2022-07-21 (×2): qty 4

## 2022-07-21 MED ORDER — HEPARIN SODIUM (PORCINE) 1000 UNIT/ML IJ SOLN
INTRAMUSCULAR | Status: AC
  Filled 2022-07-21: qty 10

## 2022-07-21 MED ORDER — HEPARIN SODIUM (PORCINE) 1000 UNIT/ML IJ SOLN
INTRAMUSCULAR | Status: DC | PRN
  Administered 2022-07-21: 2000 [IU] via INTRAVENOUS
  Administered 2022-07-21: 5000 [IU] via INTRAVENOUS
  Administered 2022-07-21: 1000 [IU] via INTRAVENOUS
  Administered 2022-07-21: 4000 [IU] via INTRAVENOUS

## 2022-07-21 MED ORDER — TICAGRELOR 90 MG PO TABS
ORAL_TABLET | ORAL | Status: AC
  Filled 2022-07-21: qty 2

## 2022-07-21 MED ORDER — SODIUM CHLORIDE 0.9 % IV SOLN: INTRAVENOUS | Status: DC

## 2022-07-21 MED ORDER — LIDOCAINE HCL (PF) 1 % IJ SOLN
INTRAMUSCULAR | Status: DC | PRN
  Administered 2022-07-21: 2 mL via INTRADERMAL

## 2022-07-21 MED ORDER — METOPROLOL TARTRATE 25 MG PO TABS
25.0000 mg | ORAL_TABLET | Freq: Two times a day (BID) | ORAL | Status: DC
  Administered 2022-07-21 (×2): 25 mg via ORAL
  Filled 2022-07-21 (×2): qty 1

## 2022-07-21 MED ORDER — SODIUM CHLORIDE 0.9% FLUSH
3.0000 mL | Freq: Two times a day (BID) | INTRAVENOUS | Status: DC
  Administered 2022-07-21 – 2022-07-22 (×3): 3 mL via INTRAVENOUS

## 2022-07-21 MED ORDER — FUROSEMIDE 10 MG/ML IJ SOLN
INTRAMUSCULAR | Status: DC | PRN
  Administered 2022-07-21: 20 mg via INTRAVENOUS

## 2022-07-21 MED ORDER — SODIUM CHLORIDE 0.9 % IV SOLN: 250.0000 mL | INTRAVENOUS | Status: DC | PRN

## 2022-07-21 MED ORDER — LIDOCAINE HCL (PF) 1 % IJ SOLN
INTRAMUSCULAR | Status: AC
  Filled 2022-07-21: qty 30

## 2022-07-21 MED ORDER — VERAPAMIL HCL 2.5 MG/ML IV SOLN
INTRAVENOUS | Status: AC
  Filled 2022-07-21: qty 2

## 2022-07-21 MED ORDER — ONDANSETRON HCL 4 MG/2ML IJ SOLN: 4.0000 mg | Freq: Four times a day (QID) | INTRAMUSCULAR | Status: DC | PRN

## 2022-07-21 MED ORDER — CHLORHEXIDINE GLUCONATE CLOTH 2 % EX PADS
6.0000 | MEDICATED_PAD | Freq: Every day | CUTANEOUS | Status: DC
  Administered 2022-07-21 – 2022-07-22 (×3): 6 via TOPICAL

## 2022-07-21 MED ORDER — TICAGRELOR 90 MG PO TABS
90.0000 mg | ORAL_TABLET | Freq: Two times a day (BID) | ORAL | Status: DC
  Administered 2022-07-21 – 2022-07-22 (×2): 90 mg via ORAL
  Filled 2022-07-21 (×2): qty 1

## 2022-07-21 MED ORDER — ROSUVASTATIN CALCIUM 20 MG PO TABS
40.0000 mg | ORAL_TABLET | Freq: Every day | ORAL | Status: DC
  Administered 2022-07-21 – 2022-07-22 (×2): 40 mg via ORAL
  Filled 2022-07-21 (×2): qty 2

## 2022-07-21 MED ORDER — VERAPAMIL HCL 2.5 MG/ML IV SOLN
INTRAVENOUS | Status: DC | PRN
  Administered 2022-07-21: 10 mL via INTRA_ARTERIAL

## 2022-07-21 MED ORDER — FUROSEMIDE 10 MG/ML IJ SOLN
INTRAMUSCULAR | Status: AC
  Filled 2022-07-21: qty 4

## 2022-07-21 MED ORDER — HEPARIN (PORCINE) IN NACL 1000-0.9 UT/500ML-% IV SOLN
INTRAVENOUS | Status: DC | PRN
  Administered 2022-07-21 (×2): 500 mL

## 2022-07-21 MED ORDER — ASPIRIN 81 MG PO CHEW
81.0000 mg | CHEWABLE_TABLET | Freq: Every day | ORAL | Status: DC
  Administered 2022-07-22: 81 mg via ORAL
  Filled 2022-07-21: qty 1

## 2022-07-21 MED ORDER — FENTANYL CITRATE (PF) 100 MCG/2ML IJ SOLN
INTRAMUSCULAR | Status: DC | PRN
  Administered 2022-07-21: 25 ug via INTRAVENOUS

## 2022-07-21 MED ORDER — FENTANYL CITRATE (PF) 100 MCG/2ML IJ SOLN
INTRAMUSCULAR | Status: AC
  Filled 2022-07-21: qty 2

## 2022-07-21 MED ORDER — TICAGRELOR 90 MG PO TABS
ORAL_TABLET | ORAL | Status: DC | PRN
  Administered 2022-07-21: 180 mg via ORAL

## 2022-07-21 MED ORDER — HEPARIN SODIUM (PORCINE) 5000 UNIT/ML IJ SOLN: 4000.0000 [IU] | Freq: Once | INTRAMUSCULAR | Status: DC

## 2022-07-21 MED ORDER — HYDRALAZINE HCL 20 MG/ML IJ SOLN: 10.0000 mg | INTRAMUSCULAR | Status: AC | PRN

## 2022-07-21 MED ORDER — MIDAZOLAM HCL 2 MG/2ML IJ SOLN
INTRAMUSCULAR | Status: AC
  Filled 2022-07-21: qty 2

## 2022-07-21 MED ORDER — SODIUM CHLORIDE 0.9% FLUSH: 3.0000 mL | INTRAVENOUS | Status: DC | PRN

## 2022-07-21 MED ORDER — LABETALOL HCL 5 MG/ML IV SOLN: 10.0000 mg | INTRAVENOUS | Status: AC | PRN

## 2022-07-21 MED ORDER — ORAL CARE MOUTH RINSE: 15.0000 mL | OROMUCOSAL | Status: DC | PRN

## 2022-07-21 MED ORDER — ACETAMINOPHEN 325 MG PO TABS: 650.0000 mg | ORAL_TABLET | ORAL | Status: DC | PRN

## 2022-07-21 MED ORDER — IOHEXOL 350 MG/ML SOLN
INTRAVENOUS | Status: DC | PRN
  Administered 2022-07-21: 75 mL

## 2022-07-21 SURGICAL SUPPLY — 19 items
BALL SAPPHIRE NC24 3.0X12 (BALLOONS) ×1
BALLN SAPPHIRE 2.5X12 (BALLOONS) ×1
BALLOON SAPPHIRE 2.5X12 (BALLOONS) IMPLANT
BALLOON SAPPHIRE NC24 3.0X12 (BALLOONS) IMPLANT
CATH DIAG 6FR PIGTAIL ANGLED (CATHETERS) IMPLANT
CATH INFINITI 6F FL3.5 (CATHETERS) IMPLANT
CATH VISTA GUIDE 6FR JR4 (CATHETERS) IMPLANT
DEVICE RAD COMP TR BAND LRG (VASCULAR PRODUCTS) IMPLANT
GLIDESHEATH SLEND SS 6F .021 (SHEATH) IMPLANT
GUIDEWIRE VAS SION BLUE 190 (WIRE) IMPLANT
KIT ENCORE 26 ADVANTAGE (KITS) IMPLANT
KIT HEART LEFT (KITS) ×1 IMPLANT
PACK CARDIAC CATHETERIZATION (CUSTOM PROCEDURE TRAY) ×1 IMPLANT
STENT SYNERGY XD 2.75X24 (Permanent Stent) IMPLANT
SYNERGY XD 2.75X24 (Permanent Stent) ×1 IMPLANT
TRANSDUCER W/STOPCOCK (MISCELLANEOUS) ×1 IMPLANT
TUBING CIL FLEX 10 FLL-RA (TUBING) ×1 IMPLANT
WIRE ASAHI PROWATER 180CM (WIRE) IMPLANT
WIRE EMERALD 3MM-J .035X260CM (WIRE) IMPLANT

## 2022-07-21 NOTE — Progress Notes (Signed)
  Echocardiogram 2D Echocardiogram has been performed.  Sukari Grist Wynn Banker 07/21/2022, 4:03 PM

## 2022-07-21 NOTE — ED Triage Notes (Addendum)
Pt arrived via [facility] with c/c of Chest Pain with activated STEMI. PER ems pt woke at 0100 with 10/10 central chest pain radiating to jaw, and shoulder. Couple episodes of N/V. Pt had elevation in inferior leads. CBG 167  2 nitro, 4mg  zofran, 324 ASA

## 2022-07-21 NOTE — Progress Notes (Signed)
Patient seen and examined today.  Presented to the hospital overnight last night with chest pain and shortness of breath, found to have ST elevations.  Taken to the Cath Lab and had stenting of an RCA lesion.  Today he feels well and is without complaint.  He is at his baseline.  Brian Hudson plan for echo today.  He does have a hematoma in his right wrist which per nursing appears stable.  Did have a blood pressure cuff put around the hematoma.  Patient has intact sensation and movement in his right hand with good pulses in his wrist.  Brian Hudson continue to monitor for now.  If remains stable, we Brian Hudson plan for transfer to the floor tomorrow.  Brian Curenton Elberta Fortis, MD 07/21/2022 8:43 AM

## 2022-07-21 NOTE — ED Notes (Signed)
Sister Stanton Kidney updated per pts request

## 2022-07-21 NOTE — H&P (Signed)
Cardiology Admission History and Physical   Patient ID: Brian Hudson MRN: [id number]; DOB: 05/15/1950   Admission date: 07/21/2022  PCP:  Marden Noble, MD   [facility] Providers Cardiologist:  Donato Schultz, MD        Chief Complaint:  Chest pain  Patient Profile:   Brian Hudson is a 72 y.o. male with pmh sx for T2DM, HLD, HTN, GERD and CAD s/p PCI to OM in 2018 who is being seen 07/21/2022 for the evaluation of Inferior STEMI.  History of Present Illness:   Brian Hudson is a 72 y.o. male with pmh sx for T2DM, HLD, HTN, GERD and CAD s/p PCI to OM in 2019 who is being seen 07/21/2022 for the evaluation of Inferior STEMI. He has a history of Wegener's granulomatosis as well. He has been doing well today suddenly woke up at 1 AM with chest pain radiating to the jaw associated with N/V.  10/10 central chest pain radiating to jaw, and shoulder. He called EMS and they found inferior STEMI on EKG. Hence brought here. 2 nitro,  zofran, 324 ASA were given en route. Pain improved after that to 5/10. BP is 160/100. Cardiology called for admission. He denies smoking currently. Unsure which meds he has been taking.    Past Medical History:  Diagnosis Date   Arthritis    osteoarthritis    CAD (coronary artery disease)    a. 09/20/16: 45% OM, 99% ostial ramus s/p DES, 80% proximal LCx s/p DES, 80% more distal LCx on AV groove portion of the circumflex after OM1 s/p cutting balloon and PCTA. // Ex Myoview 4/22: 9'20", 10.7 METs, BP 236/83 at pk stress, 3 mm ST depression inf-lat, no symptoms / EF 57, no ischemia or infarction; low risk     Concussion    Depression    DM (diabetes mellitus) (HCC)    type 2   Dyspnea    with exertion    Dysrhythmia    hx of atrial fib- 15-20 years ago    ED (erectile dysfunction)    Fracture 2010   L 3 and L 4 healed with brace   GERD (gastroesophageal reflux disease)    HTN (hypertension)    Hyperlipidemia    IBS (irritable bowel syndrome)     Palpitations    none in last few yrs   Peripheral vascular disease (HCC)    legs    Pneumonia    hx of    Prostate cancer (HCC) 2007   Sigmoid diverticulosis    Wegener's granulomatosis    followed by dr Orlin Hilding    Past Surgical History:  Procedure Laterality Date   CARDIAC CATHETERIZATION     COLONOSCOPY N/A 06/24/2016   Procedure: COLONOSCOPY with propofol;  Surgeon: Charolett Bumpers, MD;  Location: [facility];  Service: Endoscopy;  Laterality: N/A;   colonscopy  2008   CORONARY PRESSURE/FFR STUDY N/A 09/20/2016   Procedure: Intravascular Pressure Wire/FFR Study;  Surgeon: Marykay Lex, MD;  Location: [facility] INVASIVE CV LAB;  Service: Cardiovascular;  Laterality: N/A;   CORONARY STENT INTERVENTION N/A 09/20/2016   Procedure: Coronary Stent Intervention;  Surgeon: Marykay Lex, MD;  Location: [facility] INVASIVE CV LAB;  Service: Cardiovascular;  Laterality: N/A;   CORONARY STENT INTERVENTION N/A 05/23/2017   Procedure: CORONARY STENT INTERVENTION;  Surgeon: Kathleene Hazel, MD;  Location: [facility] INVASIVE CV LAB;  Service: Cardiovascular;  Laterality: N/A;   INSERTION PROSTATE RADIATION SEED  2007  LEFT HEART CATH AND CORONARY ANGIOGRAPHY N/A 09/20/2016   Procedure: Left Heart Cath and Coronary Angiography;  Surgeon: Marykay Lex, MD;  Location: [facility] INVASIVE CV LAB;  Service: Cardiovascular;  Laterality: N/A;   LEFT HEART CATH AND CORONARY ANGIOGRAPHY N/A 05/23/2017   Procedure: LEFT HEART CATH AND CORONARY ANGIOGRAPHY;  Surgeon: Kathleene Hazel, MD;  Location: [facility] INVASIVE CV LAB;  Service: Cardiovascular;  Laterality: N/A;   PARATHYROIDECTOMY Right 02/26/2018   Procedure: RIGHT INFERIOR PARATHYROIDECTOMY;  Surgeon: Darnell Level, MD;  Location: [facility] ORS;  Service: General;  Laterality: Right;   ROTATOR CUFF REPAIR Right      Medications Prior to Admission: Prior to Admission medications   Medication Sig Start Date End Date Taking? Authorizing Provider  acetaminophen  (TYLENOL) 325 MG tablet Take 650 mg by mouth every morning.    [provider]  clopidogrel (PLAVIX) 75 MG tablet TAKE 1 TABLET(75 MG) BY MOUTH DAILY 11/26/21   Jake Bathe, MD  empagliflozin (JARDIANCE) 25 MG TABS tablet Take 25 mg by mouth daily. 08/08/21   [provider]  escitalopram (LEXAPRO) 10 MG tablet Take 10 mg by mouth daily.    [provider]  Fluticasone Furoate (ARNUITY ELLIPTA) 100 MCG/ACT AEPB Inhale 1 puff into the lungs daily. 06/28/20   Kalman Shan, MD  Multiple Vitamin (MULTIVITAMIN WITH MINERALS) TABS tablet Take 1 tablet by mouth daily.    [provider]  nitroGLYCERIN (NITROSTAT) 0.4 MG SL tablet Place 1 tablet (0.4 mg total) under the tongue every 5 (five) minutes x 3 doses as needed for chest pain. 11/02/21   Jake Bathe, MD  pantoprazole (PROTONIX) 40 MG tablet Take 1 tablet (40 mg total) by mouth 2 (two) times daily. 04/10/17   Zehr, Shanda Bumps D, PA-C  rosuvastatin (CRESTOR) 5 MG tablet Take 10 mg by mouth daily.     [provider]  Semaglutide,0.25 or 0.5MG /DOS, (OZEMPIC, 0.25 OR 0.5 MG/DOSE,) 2 MG/3ML SOPN 0.25mg  weekly for 4 weeks then 0.5mg  weekly after that 08/08/21   [provider]  SYMBICORT 160-4.5 MCG/ACT inhaler Inhale 2 puffs into the lungs 2 (two) times daily. 08/09/21   [provider]  valsartan (DIOVAN) 80 MG tablet 1 tablet    [provider]     Allergies:    Allergies  Allergen Reactions   Atorvastatin Other (See Comments)    arthralgia Other reaction(s): arthralgias   Codeine Other (See Comments)    "jittery"   Ezetimibe Other (See Comments)    arthralgia Other reaction(s): ? side effect   Zocor [Simvastatin] Other (See Comments)    arthralgia    Social History:   Social History   Socioeconomic History   Marital status: Divorced    Spouse name: Not on file   Number of children: Not on file   Years of education: Not on file   Highest education level: Not on  file  Occupational History   Occupation: Contractor  Tobacco Use   Smoking status: Former    Packs/day: 2.50    Years: 31.00    Additional pack years: 0.00    Total pack years: 77.50    Types: Cigarettes    Start date: 1962    Quit date: 04/09/1991    Years since quitting: 31.3   Smokeless tobacco: Never  Vaping Use   Vaping Use: Never used  Substance and Sexual Activity   Alcohol use: No    Alcohol/week: 0.0 standard drinks of alcohol   Drug use:  Never    Comment:   marijuana last used 2- weeks ago as of 06-20-16   Sexual activity: Not on file  Other Topics Concern   Not on file  Social History Narrative   Not on file   Social Determinants of Health   Financial Resource Strain: Not on file  Food Insecurity: Not on file  Transportation Needs: Not on file  Physical Activity: Inactive (07/08/2017)   Exercise Vital Sign    Days of Exercise per Week: 0 days    Minutes of Exercise per Session: 0 min  Stress: Stress Concern Present (07/08/2017)   [employer] of Occupational Health - Occupational Stress Questionnaire    Feeling of Stress : To some extent  Social Connections: Not on file  Intimate Partner Violence: Not on file    Family History:  The patient's family history includes CAD in his father; Cancer in his brother; Diabetic kidney disease in his mother; Healthy in his sister and sister; Heart attack (age of onset: 29) in his father.    ROS:  Please see the history of present illness.  All other ROS reviewed and negative.     Physical Exam/Data:  There were no vitals filed for this visit. No intake or output data in the 24 hours ending 07/21/22 0451    11/16/2021    8:04 AM 11/02/2021    9:19 AM 04/26/2021    9:14 AM  Last 3 Weights  Weight (lbs) 168 lb 6.4 oz 167 lb 12.8 oz 173 lb 3.2 oz  Weight (kg) 76.386 kg 76.114 kg 78.563 kg     There is no height or weight on file to calculate BMI.  General:  Well nourished, well developed, in mild distress HEENT:  normal Neck: no JVD Vascular: No carotid bruits; Distal pulses 2+ bilaterally   Cardiac:  normal S1, S2; RRR; no murmur  Lungs:  clear to auscultation bilaterally, no wheezing, rhonchi or rales  Abd: soft, nontender, no hepatomegaly  Ext: no edema Musculoskeletal:  No deformities, BUE and BLE strength normal and equal Skin: warm and dry  Neuro:  CNs 2-12 intact, no focal abnormalities noted Psych:  Normal affect    EKG:  The ECG that was done  was personally reviewed and demonstrates ST elevations in inferior leads and ST depressions in lateral leads  Relevant CV Studies:  Nucelar Stress Test(07/21/20): The left ventricular ejection fraction is normal (55-65%). Nuclear stress EF: 57%. Blood pressure demonstrated a hypertensive response to exercise. This is a low risk study.   Exercise time: 9:20 min Workload: 10.7 METS, above average exercise capacity Test stopped due to: THR met BP response: hypertensive 236/83 mmHg with peak stress HR response: normal Rhythm: SR. ECG: horizontal ST segment depression of 3 mm was noted during stress in the II, III, aVF, V4, V5 and V6 leads, beginning at 7 minutes of stress, and returning to baseline after 5-9 minutes of recovery.   No ischemia or infarction on perfusion images. Normal wall motion.    ECHO(06/12/20):  1. Left ventricular ejection fraction, by estimation, is 60 to 65%. The  left ventricle has normal function. The left ventricle has no regional  wall motion abnormalities. There is mild left ventricular hypertrophy.  Left ventricular diastolic parameters  are consistent with Grade II diastolic dysfunction (pseudonormalization).   2. Right ventricular systolic function is normal. The right ventricular  size is normal. There is normal pulmonary artery systolic pressure. The  estimated right ventricular systolic pressure is 31.7 mmHg.  3. Left atrial size was mildly dilated.   4. The mitral valve is normal in structure. Trivial  mitral valve  regurgitation. No evidence of mitral stenosis.   5. The aortic valve is tricuspid. Aortic valve regurgitation is not  visualized. Mild aortic valve stenosis. Aortic valve area, by VTI measures  1.71 cm. Aortic valve mean gradient measures 10.0 mmHg.   6. The inferior vena cava is normal in size with greater than 50%  respiratory variability, suggesting right atrial pressure of 3 mmHg.    LEFT HEART CATH 05/23/2017 LAD ostial 30, distal 50; D1 ostial 50 LCx ostial 30, proximal stent patent, mid 40; RI 90; OM1 stent patent then 80, 50 RCA mid 20; RPAV 50 1. Non-obstructive diffuse disease in the LAD 2. Severe stenosis very small caliber intermediate branch. Unchanged from last cath and too small for PCI 3. Severe stenosis OM1 just beyond old stent. Successful PTCA/DES x 1 OM1. 4. Non-obstructive disease in the large dominant RCA 5. Normal LV systolic function.    Laboratory Data:  High Sensitivity Troponin:  No results for input(s): "TROPONINIHS" in the last 720 hours.    Chemistry Recent Labs  Lab 07/21/22 0447  NA 139  K 3.6    No results for input(s): "PROT", "ALBUMIN", "AST", "ALT", "ALKPHOS", "BILITOT" in the last 168 hours. Lipids No results for input(s): "CHOL", "TRIG", "HDL", "LABVLDL", "LDLCALC", "CHOLHDL" in the last 168 hours. Hematology Recent Labs  Lab 07/21/22 0447  HGB 14.6  HCT 43.0   Thyroid No results for input(s): "TSH", "FREET4" in the last 168 hours. BNPNo results for input(s): "BNP", "PROBNP" in the last 168 hours.  DDimer No results for input(s): "DDIMER" in the last 168 hours.   Radiology/Studies:  No results found.   Assessment and Plan:   # Inferior STEMI # HTN # T2DM # HLD # CAD s/p PCI  -Had PCI done in 2019 to OM. He has been on Plavix -Now coming with acute CP; EKG shows ST elevations in inferior leads and ST depressions in lateral leads -Activate cath lab for emergency LHC -Aspirin loaded 325 mg -Atorvastatin 80  mg -Zetia 10 mg -Echo -IV heparin -Insulin per protocol for T2DM -He takes valsartan for BP- continue with that.   For questions or updates, please contact [facility] Please consult [email or web address] for contact info under     Signed, Hermelinda Dellen, MD  07/21/2022 4:51 AM

## 2022-07-21 NOTE — ED Provider Notes (Signed)
Emergency Department Provider Note   I have reviewed the triage vital signs and the nursing notes.   HISTORY  Chief Complaint Chest Pain   HPI Brian Hudson is a 72 y.o. male with PMH of CAD, DM, HTN, and HLD presents to the emergency department as a code STEMI activated by EMS in the field.  He woke around 1 AM with crushing central chest pain.  Pain radiated to the right shoulder and jaw.  He had 2 episodes of vomiting and called EMS.  They administered full dose aspirin and 2 sublingual nitroglycerin with pain now 2 out of 10 down from an 8.  No pain into the abdomen.  Patient unsure regarding his CAD history or if he has had stents in the past.    Past Medical History:  Diagnosis Date   Arthritis    osteoarthritis    CAD (coronary artery disease)    a. 09/20/16: 45% OM, 99% ostial ramus s/p DES, 80% proximal LCx s/p DES, 80% more distal LCx on AV groove portion of the circumflex after OM1 s/p cutting balloon and PCTA. // Ex Myoview 4/22: 9'20", 10.7 METs, BP 236/83 at pk stress, 3 mm ST depression inf-lat, no symptoms / EF 57, no ischemia or infarction; low risk     Concussion    Depression    DM (diabetes mellitus) (HCC)    type 2   Dyspnea    with exertion    Dysrhythmia    hx of atrial fib- 15-20 years ago    ED (erectile dysfunction)    Fracture 2010   L 3 and L 4 healed with brace   GERD (gastroesophageal reflux disease)    HTN (hypertension)    Hyperlipidemia    IBS (irritable bowel syndrome)    Palpitations    none in last few yrs   Peripheral vascular disease (HCC)    legs    Pneumonia    hx of    Prostate cancer (HCC) 2007   Sigmoid diverticulosis    Wegener's granulomatosis    followed by dr Orlin Hilding    Review of Systems  Constitutional: No fever/chills Cardiovascular: Positive chest pain. Respiratory: Denies shortness of breath. Gastrointestinal: No abdominal pain. Positive nausea and vomiting.  Musculoskeletal: Negative for back  pain.  ____________________________________________   PHYSICAL EXAM:  VITAL SIGNS: HR: 73 BP: 169/75 RR: 25  Constitutional: Alert and oriented. Appears nervous and unwell.  Eyes: Conjunctivae are normal.  Head: Atraumatic. Nose: No congestion/rhinnorhea. Mouth/Throat: Mucous membranes are moist.  Neck: No stridor.   Cardiovascular: Normal rate, regular rhythm. Good peripheral circulation. Grossly normal heart sounds.   Respiratory: Normal respiratory effort.  No retractions. Lungs CTAB. Gastrointestinal: Soft and nontender. No distention.  Musculoskeletal: No lower extremity tenderness nor edema. No gross deformities of extremities. Neurologic:  Normal speech and language. No gross focal neurologic deficits are appreciated.  Skin:  Skin is warm, dry and intact. No rash noted.   ____________________________________________   LABS (all labs ordered are listed, but only abnormal results are displayed)  Labs Reviewed  POCT I-STAT EG7 - Abnormal; Notable for the following components:      Result Value   pH, Ven 7.506 (*)    pCO2, Ven 25.5 (*)    pO2, Ven 16 (*)    TCO2 21 (*)    Calcium, Ion 1.10 (*)    All other components within normal limits  POCT I-STAT, CHEM 8 - Abnormal; Notable for the following components:  Creatinine, Ser 1.50 (*)    Glucose, Bld 218 (*)    Calcium, Ion 1.10 (*)    TCO2 20 (*)    All other components within normal limits  HEMOGLOBIN A1C  CBC WITH DIFFERENTIAL/PLATELET  PROTIME-INR  APTT  COMPREHENSIVE METABOLIC PANEL  LIPID PANEL  TROPONIN I (HIGH SENSITIVITY)   ____________________________________________  EKG  EMS EKG consistent with inferior STEMI.   ____________________________________________   PROCEDURES  Procedure(s) performed:   Procedures  None ____________________________________________   INITIAL IMPRESSION / ASSESSMENT AND PLAN / ED COURSE  Pertinent labs & imaging results that were available during my care  of the patient were reviewed by me and considered in my medical decision making (see chart for details).   This patient is Presenting for Evaluation of CP, which does require a range of treatment options, and is a complaint that involves a high risk of morbidity and mortality.  The Differential Diagnoses includes but is not exclusive to acute coronary syndrome, aortic dissection, pulmonary embolism, cardiac tamponade, community-acquired pneumonia, pericarditis, musculoskeletal chest wall pain, etc.   Critical Interventions-    Medications  0.9 %  sodium chloride infusion (has no administration in time range)  heparin injection 4,000 Units ( Intravenous MAR Hold 07/21/22 0447)    Reassessment after intervention:  pain improved with nitro.    I did obtain Additional Historical Information from EMS, as the patient is in distress.  I decided to review pertinent External Data, and in summary patient with heart cath in 2019 with DES placement.   Clinical Laboratory Tests: I-STAT VBG shows pH of 7.5.  CO2 of 25.  Sodium 139.  Creatinine 1.5.  Hemoglobin 15.   Radiologic Tests: ordered but plan is for patient to go emergently to the cath lab with cardiology.   Cardiac Monitor Tracing which shows NSR.    Social Determinants of Health Risk patient has a smoking history.   Consult complete with Cardiology. Plan to go emergently to the cath lab.   Medical Decision Making: Summary:  Patient presents to the emergency department with chest pain, nausea/vomiting.  EKG shows inferior ST elevation consistent with STEMI. Pain improved en route with EMS. Patient not requiring emergent airway mgmt or pacing. No hypotension. Cardiology at bedside upon patient arrival with plan to take emergently to the cath lab.   Patient's presentation is most consistent with acute presentation with potential threat to life or bodily function.   Disposition:  admit  ____________________________________________  FINAL CLINICAL IMPRESSION(S) / ED DIAGNOSES  Final diagnoses:  ST elevation myocardial infarction (STEMI), unspecified artery    Note:  This document was prepared using Dragon voice recognition software and may include unintentional dictation errors.  Alona Bene, MD, [facility]    Karrisa Didio, Arlyss Repress, MD 07/21/22 [phone]

## 2022-07-22 ENCOUNTER — Other Ambulatory Visit (HOSPITAL_COMMUNITY): Payer: Self-pay

## 2022-07-22 ENCOUNTER — Encounter (HOSPITAL_COMMUNITY): Payer: Self-pay | Admitting: Internal Medicine

## 2022-07-22 DIAGNOSIS — Z006 Encounter for examination for normal comparison and control in clinical research program: Secondary | ICD-10-CM

## 2022-07-22 LAB — CBC
HCT: 44.6 % (ref 39.0–52.0)
Hemoglobin: 14.7 g/dL (ref 13.0–17.0)
MCH: 29.2 pg (ref 26.0–34.0)
MCHC: 33 g/dL (ref 30.0–36.0)
MCV: 88.7 fL (ref 80.0–100.0)
Platelets: 204 10*3/uL (ref 150–400)
RBC: 5.03 MIL/uL (ref 4.22–5.81)
RDW: 13.5 % (ref 11.5–15.5)
WBC: 9.4 10*3/uL (ref 4.0–10.5)
nRBC: 0 % (ref 0.0–0.2)

## 2022-07-22 LAB — BASIC METABOLIC PANEL
Anion gap: 13 (ref 5–15)
BUN: 22 mg/dL (ref 8–23)
CO2: 25 mmol/L (ref 22–32)
Calcium: 9.3 mg/dL (ref 8.9–10.3)
Chloride: 100 mmol/L (ref 98–111)
Creatinine, Ser: 1.67 mg/dL — ABNORMAL HIGH (ref 0.61–1.24)
GFR, Estimated: 43 mL/min — ABNORMAL LOW (ref >60)
Glucose, Bld: 157 mg/dL — ABNORMAL HIGH (ref 70–99)
Potassium: 4.4 mmol/L (ref 3.5–5.1)
Sodium: 138 mmol/L (ref 135–145)

## 2022-07-22 MED ORDER — ROSUVASTATIN CALCIUM 40 MG PO TABS
40.0000 mg | ORAL_TABLET | Freq: Every day | ORAL | 1 refills | Status: DC
  Filled 2022-07-22: qty 90, 90d supply, fill #0

## 2022-07-22 MED ORDER — STUDY - EVOLVE-MI - EVOLOCUMAB (REPATHA) 140 MG/ML SQ INJECTION (PI-STUCKEY): 140.0000 mg | INJECTION | SUBCUTANEOUS | 0 refills | Status: DC

## 2022-07-22 MED ORDER — TICAGRELOR 90 MG PO TABS
90.0000 mg | ORAL_TABLET | Freq: Two times a day (BID) | ORAL | 2 refills | Status: DC
  Filled 2022-07-22: qty 60, 30d supply, fill #0

## 2022-07-22 MED ORDER — ASPIRIN 81 MG PO CHEW
81.0000 mg | CHEWABLE_TABLET | Freq: Every day | ORAL | 2 refills | Status: DC
  Filled 2022-07-22: qty 90, 90d supply, fill #0

## 2022-07-22 MED ORDER — STUDY - EVOLVE-MI - EVOLOCUMAB (REPATHA) 140 MG/ML SQ INJECTION (PI-STUCKEY)
140.0000 mg | INJECTION | SUBCUTANEOUS | Status: DC
  Administered 2022-07-22: 140 mg via SUBCUTANEOUS
  Filled 2022-07-22: qty 1

## 2022-07-22 NOTE — Progress Notes (Signed)
Orthostatic vitals taken this AM prior to Ambulation Pt Asymptomatic, no balance issues, no walker needed  BP's noted  8:00 109/56 8:48  99/59 8:52  81/55 8:55  85/65  Pt ambulated 334ft  BP 9:00  104/63  Pt left in chair No issues Call bell within reach All needs met

## 2022-07-22 NOTE — Plan of Care (Signed)

## 2022-07-22 NOTE — TOC Benefit Eligibility Note (Signed)
Patient Product/process development scientist completed.    The patient is currently admitted and upon discharge could be taking Brilinta 90 mg.  The current 30 day co-pay is $45.00.   The patient is insured through [facility] Part D   This test claim was processed through [facility]- copay amounts may vary at other pharmacies due to pharmacy/plan contracts, or as the patient moves through the different stages of their insurance plan.  Roland Earl, CPHT Pharmacy Patient Advocate Specialist [facility] Patient Advocate Team Direct Number: [phone]  Fax: [phone]

## 2022-07-22 NOTE — Progress Notes (Addendum)
Pt ambulated earlier with RN. Discussed with pt MI, stent, Brilinta importance, diet, exercise, NTG, and CRPII. Pt receptive. He is eager to make changes. Sounds like he has been having sx with long exertion for months. Will refer to [place] CRPII. [phone] Ethelda Chick BS, ACSM-CEP 07/22/2022 11:19 AM

## 2022-07-22 NOTE — Progress Notes (Signed)
Discharge instructions (including medications) discussed with and copy provided to patient/caregiver 

## 2022-07-22 NOTE — Progress Notes (Signed)
Rounding Note    Patient Name: Brian Hudson Date of Encounter: 07/22/2022  [facility] Cardiologist: Donato Schultz, MD   Subjective   S/p PCI of RCA yesterday due to STEMI.  Developed mild forearm hematoma.  This AM feels well without cardiovascular complaints.      TTE with preserved LV/RV function.  Tele without NSVT/VT.  Inpatient Medications    Scheduled Meds:  aspirin  81 mg Oral Daily   Chlorhexidine Gluconate Cloth  6 each Topical Daily   furosemide  40 mg Intravenous Daily   heparin  4,000 Units Intravenous Once   metoprolol tartrate  25 mg Oral BID   rosuvastatin  40 mg Oral Daily   sodium chloride flush  3 mL Intravenous Q12H   ticagrelor  90 mg Oral BID   Continuous Infusions:  sodium chloride     sodium chloride Stopped (07/21/22 1824)   PRN Meds: sodium chloride, acetaminophen, ondansetron (ZOFRAN) IV, mouth rinse, sodium chloride flush   Vital Signs    Vitals:   07/22/22 0500 07/22/22 0600 07/22/22 0700 07/22/22 0800  BP: 119/71 106/67 113/72 (!) 109/56  Pulse: (!) 57 (!) 56 63 (!) 59  Resp: Temp:      TempSrc:      SpO2: 98% 96% 96% 94%  Weight:      Height:        Intake/Output Summary (Last 24 hours) at 07/22/2022 0811 Last data filed at 07/22/2022 0600 Gross per 24 hour  Intake 864 ml  Output 1700 ml  Net -836 ml      07/21/2022    4:45 AM 11/16/2021    8:04 AM 11/02/2021    9:19 AM  Last 3 Weights  Weight (lbs) 168 lb 168 lb 6.4 oz 167 lb 12.8 oz  Weight (kg) 76.204 kg 76.386 kg 76.114 kg      Telemetry    NSR - Personally Reviewed  ECG    SR with evolving inferior infarction - Personally Reviewed  Physical Exam   GEN: No acute distress.   Neck: No JVD Cardiac: RRR, no murmurs, rubs, or gallops.  Respiratory: Clear to auscultation bilaterally. GI: Soft, nontender, non-distended  MS: No edema; No deformity. Neuro:  Nonfocal  Psych: Normal affect  Ext:  Mild RUE forearm hematoma  Labs    High  Sensitivity Troponin:   Recent Labs  Lab 07/21/22 0501 07/21/22 0631  TROPONINIHS 103* 1,501*     Chemistry Recent Labs  Lab 07/21/22 0446 07/21/22 0447 07/21/22 0501 07/22/22 0234  NA 140 139 137 138  K 3.6 3.6 3.6 4.4  CL 104  --  105 100  CO2  --   --  19* 25  GLUCOSE 218*  --  223* 157*  BUN 17  --  16 22  CREATININE 1.50*  --  1.63* 1.67*  CALCIUM  --   --  8.9 9.3  PROT  --   --  7.6  --   ALBUMIN  --   --  3.8  --   AST  --   --  30  --   ALT  --   --  28  --   ALKPHOS  --   --  75  --   BILITOT  --   --  0.8  --   GFRNONAA  --   --  44* 43*  ANIONGAP  --   --  13 13    Lipids  Recent Labs  Lab 07/21/22 0501  CHOL 142  TRIG 89  HDL 37*  LDLCALC 87  CHOLHDL 3.8    Hematology Recent Labs  Lab 07/21/22 0447 07/21/22 0501 07/22/22 0234  WBC  --  15.0* 9.4  RBC  --  4.83 5.03  HGB 14.6 14.5 14.7  HCT 43.0 42.3 44.6  MCV  --  87.6 88.7  MCH  --  30.0 29.2  MCHC  --  34.3 33.0  RDW  --  13.2 13.5  PLT  --  206 204   Thyroid No results for input(s): "TSH", "FREET4" in the last 168 hours.  BNPNo results for input(s): "BNP", "PROBNP" in the last 168 hours.  DDimer No results for input(s): "DDIMER" in the last 168 hours.   Radiology    ECHOCARDIOGRAM COMPLETE  Result Date: 07/21/2022    ECHOCARDIOGRAM REPORT   Patient Name:   OATHER MUILENBURG Date of Exam: 07/21/2022 Medical Rec #:  [id number]    Height:       69.0 in Accession #:    [id number]   Weight:       168.0 lb Date of Birth:  [birth year]   BSA:          1.918 m Patient Age:    72 years     BP:           130/74 mmHg Patient Gender: M            HR:           58 bpm. Exam Location:  Inpatient Procedure: 2D Echo, Cardiac Doppler and Color Doppler Indications:    Acute Myocardial Infarction  History:        Patient has prior history of Echocardiogram examinations, most                 recent 06/12/2020. Acute MI and CAD, Signs/Symptoms:Dyspnea; Risk                 Factors:Hypertension, Former Smoker and  Diabetes.  Sonographer:    Lucy Antigua Referring Phys: [id number] Orbie Pyo  Sonographer Comments: Suboptimal parasternal window. IMPRESSIONS  1. Limited study and Definity contrast not used so cannot assess wall motion. Recommend repeating limited study with contrast to better evaluate.  2. Left ventricular ejection fraction, by estimation, is 55 to 60% although LV endocardium not well visualized. As above, recommend repeating limited study with definity to better assess. Left ventricular endocardial border not optimally defined to evaluate regional wall motion. Left ventricular diastolic parameters are consistent with Grade I diastolic dysfunction (impaired relaxation).  3. Right ventricular systolic function is normal. The right ventricular size is normal.  4. The mitral valve is normal in structure. Trivial mitral valve regurgitation.  5. The aortic valve was not well visualized. Aortic valve regurgitation is not visualized. Aortic valve sclerosis/calcification is present, without any evidence of aortic stenosis.  6. The inferior vena cava is normal in size with greater than 50% respiratory variability, suggesting right atrial pressure of 3 mmHg. FINDINGS  Left Ventricle: Left ventricular ejection fraction, by estimation, is 55 to 60%. The left ventricle has normal function. Left ventricular endocardial border not optimally defined to evaluate regional wall motion. The left ventricular internal cavity size was normal in size. There is no left ventricular hypertrophy. Left ventricular diastolic parameters are consistent with Grade I diastolic dysfunction (impaired relaxation). Right Ventricle: The right ventricular size is normal. No increase in right ventricular wall thickness. Right ventricular systolic  function is normal. Left Atrium: Left atrial size was normal in size. Right Atrium: Right atrial size was normal in size. Pericardium: There is no evidence of pericardial effusion. Mitral Valve: The mitral  valve is normal in structure. Trivial mitral valve regurgitation. Tricuspid Valve: The tricuspid valve is normal in structure. Tricuspid valve regurgitation is not demonstrated. Aortic Valve: The aortic valve was not well visualized. Aortic valve regurgitation is not visualized. Aortic valve sclerosis/calcification is present, without any evidence of aortic stenosis. Aortic valve mean gradient measures 7.0 mmHg. Aortic valve peak gradient measures 12.7 mmHg. Aortic valve area, by VTI measures 1.67 cm. Aorta: The aortic root is normal in size and structure. Venous: The inferior vena cava is normal in size with greater than 50% respiratory variability, suggesting right atrial pressure of 3 mmHg. IAS/Shunts: The atrial septum is grossly normal.  LEFT VENTRICLE PLAX 2D LVIDd:         4.10 cm     Diastology LVIDs:         3.30 cm     LV e' medial:    3.26 cm/s LV PW:         1.00 cm     LV E/e' medial:  18.3 LV IVS:        1.00 cm     LV e' lateral:   5.55 cm/s LVOT diam:     2.00 cm     LV E/e' lateral: 10.7 LV SV:         63 LV SV Index:   33 LVOT Area:     3.14 cm  LV Volumes (MOD) LV vol d, MOD A4C: 56.4 ml LV vol s, MOD A4C: 18.8 ml LV SV MOD A4C:     56.4 ml RIGHT VENTRICLE RV S prime:     13.70 cm/s TAPSE (M-mode): 2.1 cm LEFT ATRIUM             Index        RIGHT ATRIUM           Index LA Vol (A2C):   47.0 ml 24.50 ml/m  RA Area:     11.60 cm LA Vol (A4C):   32.1 ml 16.73 ml/m  RA Volume:   24.20 ml  12.61 ml/m LA Biplane Vol: 39.7 ml 20.69 ml/m  AORTIC VALVE AV Area (Vmax):    1.51 cm AV Area (Vmean):   1.35 cm AV Area (VTI):     1.67 cm AV Vmax:           178.00 cm/s AV Vmean:          122.000 cm/s AV VTI:            0.378 m AV Peak Grad:      12.7 mmHg AV Mean Grad:      7.0 mmHg LVOT Vmax:         85.50 cm/s LVOT Vmean:        52.500 cm/s LVOT VTI:          0.201 m LVOT/AV VTI ratio: 0.53  AORTA Ao Root diam: 2.80 cm Ao Asc diam:  3.10 cm MITRAL VALVE MV Area (PHT): 2.61 cm    SHUNTS MV Decel Time:  291 msec    Systemic VTI:  0.20 m MV E velocity: 59.60 cm/s  Systemic Diam: 2.00 cm MV A velocity: 93.80 cm/s MV E/A ratio:  0.64 Laurance Flatten MD Electronically signed by Laurance Flatten MD Signature Date/Time: 07/21/2022/5:28:01 PM    Final  DG Chest Port 1 View  Result Date: 07/21/2022 CLINICAL DATA:  Chest pain and shortness of breath. EXAM: PORTABLE CHEST 1 VIEW COMPARISON:  CT 06/07/2020 FINDINGS: Heart size and mediastinal contours are unremarkable. There is no pleural fluid or airspace disease. No interstitial edema. 9 mm nodular density is identified within the left upper lobe. This corresponds to the location of a previously noted non solid nodule. The visualized osseous structures appear unremarkable. IMPRESSION: 1. No acute cardiopulmonary abnormalities. 2. 9 mm nodular density within the left upper lobe corresponds to the location of a previously noted non-solid nodule. Recommend follow-up imaging with nonemergent CT of the chest for more definitive characterization. Electronically Signed   By: Signa Kell M.D.   On: 07/21/2022 07:54   CARDIAC CATHETERIZATION  Result Date: 07/21/2022   RPAV lesion is 60% stenosed.   Ost LAD to Mid LAD lesion is 30% stenosed.   Dist LAD lesion is 50% stenosed.   Prox Cx lesion is 10% stenosed.   Ost Cx to Prox Cx lesion is 50% stenosed.   Prox Cx to Mid Cx lesion is 40% stenosed.   Mid RCA lesion is 20% stenosed.   Ost 1st Diag lesion is 50% stenosed.   1st Diag lesion is 50% stenosed.   2nd Mrg lesion is 50% stenosed.   Ost 1st Mrg lesion is 90% stenosed.   Dist RCA lesion is 50% stenosed.   Prox RCA to Mid RCA lesion is 90% stenosed.   Ost 2nd Mrg-2 lesion is 10% stenosed.   Non-stenotic Ost 2nd Mrg-1 lesion was previously treated.   A stent was successfully placed.   Post intervention, there is a 0% residual stenosis. 1.  High-grade proximal right coronary artery lesion treated with 1 drug-eluting stent. 2.  Patent left circumflex and obtuse marginal  stents with mild diffuse disease elsewhere. 3.  Highly elevated LVEDP of 26 mmHg; the patient received 20 mg of IV Lasix in the cardiac catheterization laboratory. 4.  Left ventricular function looks to be moderately diminished.;  Obtain formal echocardiogram to evaluate. Recommendation: Dual antiplatelet therapy for at least 1 year and optimal medical therapy.    Cardiac Studies   PCI 07/21/22 1.  High-grade proximal right coronary artery lesion treated with 1 drug-eluting stent. 2.  Patent left circumflex and obtuse marginal stents with mild diffuse disease elsewhere. 3.  Highly elevated LVEDP of 26 mmHg; the patient received 20 mg of IV Lasix in the cardiac catheterization laboratory. 4.  Left ventricular function looks to be moderately diminished.;  Obtain formal echocardiogram to evaluate.   Recommendation: Dual antiplatelet therapy for at least 1 year and optimal medical therapy.  TTE 07/21/22  1. Limited study and Definity contrast not used so cannot assess wall  motion. Recommend repeating limited study with contrast to better  evaluate.   2. Left ventricular ejection fraction, by estimation, is 55 to 60%  although LV endocardium not well visualized. As above, recommend repeating  limited study with definity to better assess. Left ventricular endocardial  border not optimally defined to  evaluate regional wall motion. Left ventricular diastolic parameters are  consistent with Grade I diastolic dysfunction (impaired relaxation).   3. Right ventricular systolic function is normal. The right ventricular  size is normal.   4. The mitral valve is normal in structure. Trivial mitral valve  regurgitation.   5. The aortic valve was not well visualized. Aortic valve regurgitation  is not visualized. Aortic valve sclerosis/calcification is present,  without any evidence  of aortic stenosis.   6. The inferior vena cava is normal in size with greater than 50%  respiratory variability,  suggesting right atrial pressure of 3 mmHg.     Assessment & Plan     STEMI:  Cont DAPT x 1 year, crestor 40mg , PRN NTG.  Will d/c BB given normal EF and results of REDUCE-AMI trial.  Ambulate today and ok for discharge later today with close hospital follow up. T2DM:  Cont ASA, diovan, jardiance, crestor HL:  LDL was 87 on low dose crestor, crestor now increased; goal LDL < 55 given history of STEMI HTN: BP is well controlled.     For questions or updates, please contact [facility] Please consult [email or web address] for contact info under        Signed, Orbie Pyo, MD  07/22/2022, 8:11 AM

## 2022-07-22 NOTE — Care Management (Signed)
  Transition of Care [facility]) Screening Note   Patient Details  Name: Brian Hudson Date of Birth: [birth year]   Transition of Care [facility]) CM/SW Contact:    Gala Lewandowsky, RN Phone Number: 07/22/2022, 11:25 AM    Transition of Care Department [facility]) has reviewed the patient and no TOC needs have been identified at this time. Patient presented for chest pain-post PCI of RCA. Plan for home on Brilinta-benefits check completed and  Brilinta is $45.00. TOC will continue to monitor patient advancement through interdisciplinary progression rounds. If new patient transition needs arise, please place a TOC consult.

## 2022-07-22 NOTE — Research (Signed)
EVOLVE MI Informed Consent   Subject Name: Brian Hudson  Subject met inclusion and exclusion criteria.  The informed consent form, study requirements and expectations were reviewed with the subject and questions and concerns were addressed prior to the signing of the consent form.  The subject verbalized understanding of the trial requirements.  The subject agreed to participate in the Evolve MI trial and signed the informed consent at 1039 on 15/Apr/2024.  The informed consent was obtained prior to performance of any protocol-specific procedures for the subject.  A copy of the signed informed consent was given to the subject and a copy was placed in the subject's medical record.   Dewitt Hoes     Protocol # [id number]  Subject Initial ID#:  [id number]                            Age in years: 87  *Demographics are found in the [place] Epic EMR source.   Protocol Version: v2.00 05MAY2023  Were all Eligibility Criteria Met?  [x]  Yes  []   No  Screening Visit Date:    15/Apr/2024           Protocol # [id number]   Subject ID#   [id number]                         DAY 1 Date:     15/Apr/2024 Randomization:   Geographic Region:    [place]  Randomization Date:  15/Apr/2024  Randomization Time:  1109     Treatment type Assigned   [x]  Treatment                                   []   Control             Protocol # [id number]    Subject ID#    [id number]                       DAY 1 Date:   15/Apr/2024                            Initial Study Treatment- Evolocumab Self-administration   Training for Evolocumab Self-Administration Completed?  [x]  YES  []   NO   Training for Evolocumab Self-Administration Date: 15/Apr/2024   Evolocumab Administered?  [x]  YES  []   NO   Evolocumab Start Date: __15/Apr/2024   Lot Number of Initial Dose Administered: [id number]     Additional Lot Number Distributed to Subject: [id number]     Additional Lot Number Distributed to Subject: [id number]      Additional Lot Number Distributed to Subject: [id number]     Additional Lot Number Distributed to Subject: [id number]     Additional Lot Number Distributed to Subject: - [id number]

## 2022-07-22 NOTE — Discharge Summary (Addendum)
Discharge Summary    Patient ID: Brian Hudson MRN: [id number]; DOB: [birth year]  Admit date: 07/21/2022 Discharge date: 07/22/2022  PCP:  Emilio Aspen, MD   [facility] Providers Cardiologist:  Donato Schultz, MD     Discharge Diagnoses    Principal Problem:   STEMI (ST elevation myocardial infarction) Active Problems:   Hyperlipidemia   HTN (hypertension)   DM (diabetes mellitus)  Diagnostic Studies/Procedures    Cath: 07/21/2022    RPAV lesion is 60% stenosed.   Ost LAD to Mid LAD lesion is 30% stenosed.   Dist LAD lesion is 50% stenosed.   Prox Cx lesion is 10% stenosed.   Ost Cx to Prox Cx lesion is 50% stenosed.   Prox Cx to Mid Cx lesion is 40% stenosed.   Mid RCA lesion is 20% stenosed.   Ost 1st Diag lesion is 50% stenosed.   1st Diag lesion is 50% stenosed.   2nd Mrg lesion is 50% stenosed.   Ost 1st Mrg lesion is 90% stenosed.   Dist RCA lesion is 50% stenosed.   Prox RCA to Mid RCA lesion is 90% stenosed.   Ost 2nd Mrg-2 lesion is 10% stenosed.   Non-stenotic Ost 2nd Mrg-1 lesion was previously treated.   A stent was successfully placed.   Post intervention, there is a 0% residual stenosis.   1.  High-grade proximal right coronary artery lesion treated with 1 drug-eluting stent. 2.  Patent left circumflex and obtuse marginal stents with mild diffuse disease elsewhere. 3.  Highly elevated LVEDP of 26 mmHg; the patient received 20 mg of IV Lasix in the cardiac catheterization laboratory. 4.  Left ventricular function looks to be moderately diminished.;  Obtain formal echocardiogram to evaluate.   Recommendation: Dual antiplatelet therapy for at least 1 year and optimal medical therapy.   Diagnostic Dominance: Right  Intervention   Echo: 07/21/2022  IMPRESSIONS     1. Limited study and Definity contrast not used so cannot assess wall  motion. Recommend repeating limited study with contrast to better  evaluate.   2. Left  ventricular ejection fraction, by estimation, is 55 to 60%  although LV endocardium not well visualized. As above, recommend repeating  limited study with definity to better assess. Left ventricular endocardial  border not optimally defined to  evaluate regional wall motion. Left ventricular diastolic parameters are  consistent with Grade I diastolic dysfunction (impaired relaxation).   3. Right ventricular systolic function is normal. The right ventricular  size is normal.   4. The mitral valve is normal in structure. Trivial mitral valve  regurgitation.   5. The aortic valve was not well visualized. Aortic valve regurgitation  is not visualized. Aortic valve sclerosis/calcification is present,  without any evidence of aortic stenosis.   6. The inferior vena cava is normal in size with greater than 50%  respiratory variability, suggesting right atrial pressure of 3 mmHg.   FINDINGS   Left Ventricle: Left ventricular ejection fraction, by estimation, is 55  to 60%. The left ventricle has normal function. Left ventricular  endocardial border not optimally defined to evaluate regional wall motion.  The left ventricular internal cavity  size was normal in size. There is no left ventricular hypertrophy. Left  ventricular diastolic parameters are consistent with Grade I diastolic  dysfunction (impaired relaxation).   Right Ventricle: The right ventricular size is normal. No increase in  right ventricular wall thickness. Right ventricular systolic function is  normal.   Left  Atrium: Left atrial size was normal in size.   Right Atrium: Right atrial size was normal in size.   Pericardium: There is no evidence of pericardial effusion.   Mitral Valve: The mitral valve is normal in structure. Trivial mitral  valve regurgitation.   Tricuspid Valve: The tricuspid valve is normal in structure. Tricuspid  valve regurgitation is not demonstrated.   Aortic Valve: The aortic valve was not well  visualized. Aortic valve  regurgitation is not visualized. Aortic valve sclerosis/calcification is  present, without any evidence of aortic stenosis. Aortic valve mean  gradient measures 7.0 mmHg. Aortic valve  peak gradient measures 12.7 mmHg. Aortic valve area, by VTI measures 1.67  cm.   Aorta: The aortic root is normal in size and structure.   Venous: The inferior vena cava is normal in size with greater than 50%  respiratory variability, suggesting right atrial pressure of 3 mmHg.   IAS/Shunts: The atrial septum is grossly normal.   _____________   History of Present Illness     Brian Hudson is a 72 y.o. male with pmh sx for T2DM, HLD, HTN, GERD and CAD s/p PCI to OM in 2019 who was seen 07/21/2022 for the evaluation of Inferior STEMI. He has a history of Wegener's granulomatosis as well. He has been doing well until the day of admission when he suddenly woke up at 1 AM with chest pain radiating to the jaw associated with N/V.  10/10 central chest pain radiating to jaw, and shoulder. He called EMS and they found inferior STEMI on EKG. Hence brought here. 2 nitro, 4mg  zofran, 324 ASA were given en route. Pain improved after that to 5/10. BP is 160/100. Cardiology called for admission. He denies smoking currently. Unsure which meds he has been taking.   Hospital Course     Inferior STEMI -- Underwent cardiac catheterization noted above with high-grade proximal RCA lesion treated with PCI/DES x 1, left circumflex and OM stents.  Recommendations for DAPT with aspirin/Brilinta for at least 1 year.  Seen by cardiac rehab, no recurrent chest pain. -- Continue aspirin, Crestor, Brilinta  HTN -- well controlled -- resume diovan 4/17 (instructions placed in AVS)  HLD -- LDL 87, HDL 37 -- Continue Crestor 40 mg daily -- LFT/FLP in 8 weeks  DM -- Hgb A1c 7.4 -- continue jardiance, ozempic   Patient seen by Dr. Lynnette Caffey and deemed stable for discharge home. Follow up in the office.  Medications sent to [facility] pharmacy. Educated by pharmD prior to discharge.   Did the patient have an acute coronary syndrome (MI, NSTEMI, STEMI, etc) this admission?:  Yes                               AHA/ACC Clinical Performance & Quality Measures: Aspirin prescribed? - Yes ADP Receptor Inhibitor (Plavix/Clopidogrel, Brilinta/Ticagrelor or Effient/Prasugrel) prescribed (includes medically managed patients)? - Yes Beta Blocker prescribed? - No - preserved LVEF High Intensity Statin (Lipitor 40-80mg  or Crestor 20-40mg ) prescribed? - Yes EF assessed during THIS hospitalization? - Yes For EF <40%, was ACEI/ARB prescribed? - Not Applicable (EF >/= 40%) For EF <40%, Aldosterone Antagonist (Spironolactone or Eplerenone) prescribed? - Not Applicable (EF >/= 40%) Cardiac Rehab Phase II ordered (including medically managed patients)? - Yes   The patient will be scheduled for a TOC follow up appointment in 10-14 days.  A message has been sent to the [facility] and Scheduling Pool at the  office where the patient should be seen for follow up.  _____________  Discharge Vitals Blood pressure (!) 88/59, pulse 69, temperature 98.3 F (36.8 C), temperature source Oral, resp. rate 17, height 5\' 9"  (1.753 m), weight 76.2 kg, SpO2 95 %.  Filed Weights   07/21/22 0445  Weight: 76.2 kg    Labs & Radiologic Studies    CBC Recent Labs    07/21/22 0501 07/22/22 0234  WBC 15.0* 9.4  NEUTROABS 12.2*  --   HGB 14.5 14.7  HCT 42.3 44.6  MCV 87.6 88.7  PLT 206 204   Basic Metabolic Panel Recent Labs    23/53/61 0501 07/22/22 0234  NA 137 138  K 3.6 4.4  CL 105 100  CO2 19* 25  GLUCOSE 223* 157*  BUN 16 22  CREATININE 1.63* 1.67*  CALCIUM 8.9 9.3   Liver Function Tests Recent Labs    07/21/22 0501  AST 30  ALT 28  ALKPHOS 75  BILITOT 0.8  PROT 7.6  ALBUMIN 3.8   No results for input(s): "LIPASE", "AMYLASE" in the last 72 hours. High Sensitivity Troponin:   Recent Labs  Lab  07/21/22 0501 07/21/22 0631  TROPONINIHS 103* 1,501*    BNP Invalid input(s): "POCBNP" D-Dimer No results for input(s): "DDIMER" in the last 72 hours. Hemoglobin A1C Recent Labs    07/21/22 0501  HGBA1C 7.4*   Fasting Lipid Panel Recent Labs    07/21/22 0501  CHOL 142  HDL 37*  LDLCALC 87  TRIG 89  CHOLHDL 3.8   Thyroid Function Tests No results for input(s): "TSH", "T4TOTAL", "T3FREE", "THYROIDAB" in the last 72 hours.  Invalid input(s): "FREET3" _____________  ECHOCARDIOGRAM COMPLETE  Result Date: 07/21/2022    ECHOCARDIOGRAM REPORT   Patient Name:   Brian Hudson Date of Exam: 07/21/2022 Medical Rec #:  [id number]    Height:       69.0 in Accession #:    [id number]   Weight:       168.0 lb Date of Birth:  1950-05-02   BSA:          1.918 m Patient Age:    71 years     BP:           130/74 mmHg Patient Gender: M            HR:           58 bpm. Exam Location:  Inpatient Procedure: 2D Echo, Cardiac Doppler and Color Doppler Indications:    Acute Myocardial Infarction  History:        Patient has prior history of Echocardiogram examinations, most                 recent 06/12/2020. Acute MI and CAD, Signs/Symptoms:Dyspnea; Risk                 Factors:Hypertension, Former Smoker and Diabetes.  Sonographer:    Lucy Antigua Referring Phys: [id number] Orbie Pyo  Sonographer Comments: Suboptimal parasternal window. IMPRESSIONS  1. Limited study and Definity contrast not used so cannot assess wall motion. Recommend repeating limited study with contrast to better evaluate.  2. Left ventricular ejection fraction, by estimation, is 55 to 60% although LV endocardium not well visualized. As above, recommend repeating limited study with definity to better assess. Left ventricular endocardial border not optimally defined to evaluate regional wall motion. Left ventricular diastolic parameters are consistent with Grade I diastolic dysfunction (impaired relaxation).  3. Right ventricular systolic  function is normal. The right ventricular size is normal.  4. The mitral valve is normal in structure. Trivial mitral valve regurgitation.  5. The aortic valve was not well visualized. Aortic valve regurgitation is not visualized. Aortic valve sclerosis/calcification is present, without any evidence of aortic stenosis.  6. The inferior vena cava is normal in size with greater than 50% respiratory variability, suggesting right atrial pressure of 3 mmHg. FINDINGS  Left Ventricle: Left ventricular ejection fraction, by estimation, is 55 to 60%. The left ventricle has normal function. Left ventricular endocardial border not optimally defined to evaluate regional wall motion. The left ventricular internal cavity size was normal in size. There is no left ventricular hypertrophy. Left ventricular diastolic parameters are consistent with Grade I diastolic dysfunction (impaired relaxation). Right Ventricle: The right ventricular size is normal. No increase in right ventricular wall thickness. Right ventricular systolic function is normal. Left Atrium: Left atrial size was normal in size. Right Atrium: Right atrial size was normal in size. Pericardium: There is no evidence of pericardial effusion. Mitral Valve: The mitral valve is normal in structure. Trivial mitral valve regurgitation. Tricuspid Valve: The tricuspid valve is normal in structure. Tricuspid valve regurgitation is not demonstrated. Aortic Valve: The aortic valve was not well visualized. Aortic valve regurgitation is not visualized. Aortic valve sclerosis/calcification is present, without any evidence of aortic stenosis. Aortic valve mean gradient measures 7.0 mmHg. Aortic valve peak gradient measures 12.7 mmHg. Aortic valve area, by VTI measures 1.67 cm. Aorta: The aortic root is normal in size and structure. Venous: The inferior vena cava is normal in size with greater than 50% respiratory variability, suggesting right atrial pressure of 3 mmHg. IAS/Shunts:  The atrial septum is grossly normal.  LEFT VENTRICLE PLAX 2D LVIDd:         4.10 cm     Diastology LVIDs:         3.30 cm     LV e' medial:    3.26 cm/s LV PW:         1.00 cm     LV E/e' medial:  18.3 LV IVS:        1.00 cm     LV e' lateral:   5.55 cm/s LVOT diam:     2.00 cm     LV E/e' lateral: 10.7 LV SV:         63 LV SV Index:   33 LVOT Area:     3.14 cm  LV Volumes (MOD) LV vol d, MOD A4C: 56.4 ml LV vol s, MOD A4C: 18.8 ml LV SV MOD A4C:     56.4 ml RIGHT VENTRICLE RV S prime:     13.70 cm/s TAPSE (M-mode): 2.1 cm LEFT ATRIUM             Index        RIGHT ATRIUM           Index LA Vol (A2C):   47.0 ml 24.50 ml/m  RA Area:     11.60 cm LA Vol (A4C):   32.1 ml 16.73 ml/m  RA Volume:   24.20 ml  12.61 ml/m LA Biplane Vol: 39.7 ml 20.69 ml/m  AORTIC VALVE AV Area (Vmax):    1.51 cm AV Area (Vmean):   1.35 cm AV Area (VTI):     1.67 cm AV Vmax:           178.00 cm/s AV Vmean:          122.000 cm/s AV  VTI:            0.378 m AV Peak Grad:      12.7 mmHg AV Mean Grad:      7.0 mmHg LVOT Vmax:         85.50 cm/s LVOT Vmean:        52.500 cm/s LVOT VTI:          0.201 m LVOT/AV VTI ratio: 0.53  AORTA Ao Root diam: 2.80 cm Ao Asc diam:  3.10 cm MITRAL VALVE MV Area (PHT): 2.61 cm    SHUNTS MV Decel Time: 291 msec    Systemic VTI:  0.20 m MV E velocity: 59.60 cm/s  Systemic Diam: 2.00 cm MV A velocity: 93.80 cm/s MV E/A ratio:  0.64 Laurance Flatten MD Electronically signed by Laurance Flatten MD Signature Date/Time: 07/21/2022/5:28:01 PM    Final    DG Chest Port 1 View  Result Date: 07/21/2022 CLINICAL DATA:  Chest pain and shortness of breath. EXAM: PORTABLE CHEST 1 VIEW COMPARISON:  CT 06/07/2020 FINDINGS: Heart size and mediastinal contours are unremarkable. There is no pleural fluid or airspace disease. No interstitial edema. 9 mm nodular density is identified within the left upper lobe. This corresponds to the location of a previously noted non solid nodule. The visualized osseous structures  appear unremarkable. IMPRESSION: 1. No acute cardiopulmonary abnormalities. 2. 9 mm nodular density within the left upper lobe corresponds to the location of a previously noted non-solid nodule. Recommend follow-up imaging with nonemergent CT of the chest for more definitive characterization. Electronically Signed   By: Signa Kell M.D.   On: 07/21/2022 07:54   CARDIAC CATHETERIZATION  Result Date: 07/21/2022   RPAV lesion is 60% stenosed.   Ost LAD to Mid LAD lesion is 30% stenosed.   Dist LAD lesion is 50% stenosed.   Prox Cx lesion is 10% stenosed.   Ost Cx to Prox Cx lesion is 50% stenosed.   Prox Cx to Mid Cx lesion is 40% stenosed.   Mid RCA lesion is 20% stenosed.   Ost 1st Diag lesion is 50% stenosed.   1st Diag lesion is 50% stenosed.   2nd Mrg lesion is 50% stenosed.   Ost 1st Mrg lesion is 90% stenosed.   Dist RCA lesion is 50% stenosed.   Prox RCA to Mid RCA lesion is 90% stenosed.   Ost 2nd Mrg-2 lesion is 10% stenosed.   Non-stenotic Ost 2nd Mrg-1 lesion was previously treated.   A stent was successfully placed.   Post intervention, there is a 0% residual stenosis. 1.  High-grade proximal right coronary artery lesion treated with 1 drug-eluting stent. 2.  Patent left circumflex and obtuse marginal stents with mild diffuse disease elsewhere. 3.  Highly elevated LVEDP of 26 mmHg; the patient received 20 mg of IV Lasix in the cardiac catheterization laboratory. 4.  Left ventricular function looks to be moderately diminished.;  Obtain formal echocardiogram to evaluate. Recommendation: Dual antiplatelet therapy for at least 1 year and optimal medical therapy.   Disposition   Pt is being discharged home today in good condition.  Follow-up Plans & Appointments     Follow-up Information     Sharlene Dory, PA-C Follow up on 08/02/2022.   Specialty: Cardiology Why: at 10am for your follow up appt with Dr. Anne Fu' PA [facility] Contact information: [address] [place]  [address] [phone]  Discharge Instructions     Amb Referral to Cardiac Rehabilitation   Complete by: As directed    Diagnosis:  Coronary Stents NSTEMI PTCA     After initial evaluation and assessments completed: Virtual Based Care may be provided alone or in conjunction with Phase 2 Cardiac Rehab based on patient barriers.: Yes   Intensive Cardiac Rehabilitation (ICR) [facility] location only OR Traditional Cardiac Rehabilitation (TCR) *If criteria for ICR are not met will enroll in TCR [facility] only): Yes   Call MD for:  difficulty breathing, headache or visual disturbances   Complete by: As directed    Call MD for:  redness, tenderness, or signs of infection (pain, swelling, redness, odor or green/yellow discharge around incision site)   Complete by: As directed    Diet - low sodium heart healthy   Complete by: As directed    Discharge instructions   Complete by: As directed    Radial Site Care Refer to this sheet in the next few weeks. These instructions provide you with information on caring for yourself after your procedure. Your caregiver may also give you more specific instructions. Your treatment has been planned according to current medical practices, but problems sometimes occur. Call your caregiver if you have any problems or questions after your procedure. HOME CARE INSTRUCTIONS You may shower the day after the procedure. Remove the bandage (dressing) and gently wash the site with plain soap and water. Gently pat the site dry.  Do not apply powder or lotion to the site.  Do not submerge the affected site in water for 3 to 5 days.  Inspect the site at least twice daily.  Do not flex or bend the affected arm for 24 hours.  No lifting over 5 pounds (2.3 kg) for 5 days after your procedure.  Do not drive home if you are discharged the same day of the procedure. Have someone else drive you.  You may drive 24 hours after the procedure unless otherwise instructed by  your caregiver.  What to expect: Any bruising will usually fade within 1 to 2 weeks.  Blood that collects in the tissue (hematoma) may be painful to the touch. It should usually decrease in size and tenderness within 1 to 2 weeks.  SEEK IMMEDIATE MEDICAL CARE IF: You have unusual pain at the radial site.  You have redness, warmth, swelling, or pain at the radial site.  You have drainage (other than a small amount of blood on the dressing).  You have chills.  You have a fever or persistent symptoms for more than 72 hours.  You have a fever and your symptoms suddenly get worse.  Your arm becomes pale, cool, tingly, or numb.  You have heavy bleeding from the site. Hold pressure on the site.   PLEASE DO NOT MISS ANY DOSES OF YOUR BRILINTA!!!!! Also keep a log of you blood pressures and bring back to your follow up appt. Please call the office with any questions.   Patients taking blood thinners should generally stay away from medicines like ibuprofen, Advil, Motrin, naproxen, and Aleve due to risk of stomach bleeding. You may take Tylenol as directed or talk to your primary doctor about alternatives.  PLEASE ENSURE THAT YOU DO NOT RUN OUT OF YOUR BRILINTA. This medication is very important to remain on for at least one year. IF you have issues obtaining this medication due to cost please CALL the office 3-5 business days prior to running out in order to prevent missing  doses of this medication.   Increase activity slowly   Complete by: As directed         Discharge Medications   Allergies as of 07/22/2022       Reactions   Atorvastatin Other (See Comments)   arthralgia Other reaction(s): arthralgias   Codeine Other (See Comments)   "jittery"   Ezetimibe Other (See Comments)   arthralgia Other reaction(s): ? side effect   Zocor [simvastatin] Other (See Comments)   arthralgia        Medication List     STOP taking these medications    clopidogrel 75 MG tablet Commonly  known as: PLAVIX       TAKE these medications    acetaminophen 325 MG tablet Commonly known as: TYLENOL Take 650 mg by mouth every morning.   Arnuity Ellipta 100 MCG/ACT Aepb Generic drug: Fluticasone Furoate Inhale 1 puff into the lungs daily.   aspirin 81 MG chewable tablet Chew 1 tablet (81 mg total) by mouth daily. Start taking on: July 23, 2022   escitalopram 10 MG tablet Commonly known as: LEXAPRO Take 10 mg by mouth daily.   EVOLVE-MI evolocumab 140 mg/1 mL SQ injection Inject 1 mL (140 mg total) into the skin every 14 (fourteen) days. For Investigational Use Only. Inject subcutaneously into abdomen, thigh, or upper arm. Rotate injection sites and do not inject into areas where skin is tender, bruised, or red. please contact [facility] ([phone]) for any questions or concerns regarding the medication   Jardiance 25 MG Tabs tablet Generic drug: empagliflozin Take 25 mg by mouth daily.   multivitamin with minerals Tabs tablet Take 1 tablet by mouth daily.   nitroGLYCERIN 0.4 MG SL tablet Commonly known as: NITROSTAT Place 1 tablet (0.4 mg total) under the tongue every 5 (five) minutes x 3 doses as needed for chest pain.   Ozempic (0.25 or 0.5 MG/DOSE) 2 MG/3ML Sopn Generic drug: Semaglutide(0.25 or 0.5MG /DOS) 0.25mg  weekly for 4 weeks then 0.5mg  weekly after that   pantoprazole 40 MG tablet Commonly known as: PROTONIX Take 1 tablet (40 mg total) by mouth 2 (two) times daily.   rosuvastatin 40 MG tablet Commonly known as: CRESTOR Take 1 tablet (40 mg total) by mouth daily. Start taking on: July 23, 2022 What changed:  medication strength how much to take   Symbicort 160-4.5 MCG/ACT inhaler Generic drug: budesonide-formoterol Inhale 2 puffs into the lungs 2 (two) times daily.   ticagrelor 90 MG Tabs tablet Commonly known as: BRILINTA Take 1 tablet (90 mg total) by mouth 2 (two) times daily.   valsartan 80 MG tablet Commonly known as: DIOVAN 1 tablet Notes  to patient: Do not resume this medication until 4/17. If systolic (top BP number) is less than 100 would medication until follow up appt         Outstanding Labs/Studies   FLP/LFTs in 8 weeks  Duration of Discharge Encounter   Greater than 30 minutes including physician time.  Signed, Laverda Page, NP 07/22/2022, 11:39 AM   ATTENDING ATTESTATION:  After conducting a review of all available clinical information with the care team, interviewing the patient, and performing a physical exam, I agree with the findings and plan described in this note.   GEN: No acute distress.   HEENT:  MMM, no JVD, no scleral icterus Cardiac: RRR, no murmurs, rubs, or gallops.  Respiratory: Clear to auscultation bilaterally. GI: Soft, nontender, non-distended  MS: No edema; No deformity. Neuro:  Nonfocal  Vasc:  +2  radial pulses  Patient doing well after PCI of RCA due to STEMI.    Plan:  STEMI:  Cont DAPT x 1 year, crestor , PRN NTG.  Will d/c BB given normal EF and results of REDUCE-AMI trial.  Ambulate today and ok for discharge later today with close hospital follow up. T2DM:  Cont ASA, diovan, jardiance, crestor HL:  LDL was 87 on low dose crestor, crestor now increased; goal LDL < 55 given history of STEMI HTN: BP is well controlled.  Alverda Skeans, MD Pager [phone]

## 2022-07-23 LAB — LIPOPROTEIN A (LPA): Lipoprotein (a): 206.4 nmol/L — ABNORMAL HIGH (ref <75.0)

## 2022-07-31 DIAGNOSIS — I129 Hypertensive chronic kidney disease with stage 1 through stage 4 chronic kidney disease, or unspecified chronic kidney disease: Secondary | ICD-10-CM | POA: Diagnosis not present

## 2022-07-31 DIAGNOSIS — D631 Anemia in chronic kidney disease: Secondary | ICD-10-CM | POA: Diagnosis not present

## 2022-07-31 DIAGNOSIS — E1122 Type 2 diabetes mellitus with diabetic chronic kidney disease: Secondary | ICD-10-CM | POA: Diagnosis not present

## 2022-07-31 DIAGNOSIS — N1831 Chronic kidney disease, stage 3a: Secondary | ICD-10-CM | POA: Diagnosis not present

## 2022-08-01 NOTE — Progress Notes (Signed)
Office Visit    Patient Name: Brian Hudson Date of Encounter: 08/02/2022  PCP:  Emilio Aspen, MD   [facility]  Cardiologist:  Donato Schultz, MD  Advanced Practice Provider:  No care team member to display Electrophysiologist:  None   hpi    Brian Hudson is a 72 y.o. male with a past medical history significant for type 2 diabetes mellitus, HLD, HTN, GERD and CAD status post PCI to OM in 2019 who was seen 07/21/2022 for the evaluation of inferior STEMI presents today for follow-up appointment.  He has a history of Wegener's granulomatosis as well.  He has been doing well until the day of admission when he suddenly woke up at 1 AM with chest pain radiating to his jaw associated with nausea and vomiting.  10 out of 10 central chest pain radiating to the jaw and shoulder.  Called EMS and was found to have inferior STEMI on EKG.  He was then brought to the hospital.  2 nitro's and 4 mg of Zofran.  324 mg of aspirin are given and route.  Pain improved after that to a 5 out of 10.  BP was 160/100.  Cardiology was called for admission.  Denies any smoking.  Unsure of which medications he had been taking.  He ultimately went to cardiac catheterization and found to have high-grade proximal RCA lesion treated with PCI/DES x 1, left circumflex and OM stents.  Recommended for DAPT with aspirin and Brilinta for at least a year.  Seen by cardiac rehab no recurrent chest pain.  Today, he tells me that he has no chest pains.  He is pretty much always short of breath and this has not changed.  Repatha is a new medication and he is tolerating this well.  He is also on valsartan 80 mg daily.  This has been held in the past due to hypotension but blood pressure stable today at 132/60.  Also provided some lab work today from his renal doctor and kidneys have remained stable with creatinine around 1.6 which has been the case for the last 2 years.  He stated he is gone back to his  walking routine and is walking about a mile a day.  Encouraged him to increase walking time slowly over time and also encouraged cardiac rehab as well as the cooking classes that are offered.  Reports no chest pain, pressure, or tightness. No edema, orthopnea, PND. Reports no palpitations.     Past Medical History    Past Medical History:  Diagnosis Date   Arthritis    osteoarthritis    CAD (coronary artery disease)    a. 09/20/16: 45% OM, 99% ostial ramus s/p DES, 80% proximal LCx s/p DES, 80% more distal LCx on AV groove portion of the circumflex after OM1 s/p cutting balloon and PCTA. // Ex Myoview 4/22: 9'20", 10.7 METs, BP 236/83 at pk stress, 3 mm ST depression inf-lat, no symptoms / EF 57, no ischemia or infarction; low risk     Concussion    Depression    DM (diabetes mellitus) (HCC)    type 2   Dyspnea    with exertion    Dysrhythmia    hx of atrial fib- 15-20 years ago    ED (erectile dysfunction)    Fracture 2010   L 3 and L 4 healed with brace   GERD (gastroesophageal reflux disease)    HTN (hypertension)    Hyperlipidemia  IBS (irritable bowel syndrome)    Palpitations    none in last few yrs   Peripheral vascular disease (HCC)    legs    Pneumonia    hx of    Prostate cancer (HCC) 2007   Sigmoid diverticulosis    Wegener's granulomatosis    followed by dr Orlin Hilding   Past Surgical History:  Procedure Laterality Date   CARDIAC CATHETERIZATION     COLONOSCOPY N/A 06/24/2016   Procedure: COLONOSCOPY with propofol;  Surgeon: Charolett Bumpers, MD;  Location: [facility];  Service: Endoscopy;  Laterality: N/A;   colonscopy  2008   CORONARY PRESSURE/FFR STUDY N/A 09/20/2016   Procedure: Intravascular Pressure Wire/FFR Study;  Surgeon: Marykay Lex, MD;  Location: [facility] INVASIVE CV LAB;  Service: Cardiovascular;  Laterality: N/A;   CORONARY STENT INTERVENTION N/A 09/20/2016   Procedure: Coronary Stent Intervention;  Surgeon: Marykay Lex, MD;  Location: [facility]  INVASIVE CV LAB;  Service: Cardiovascular;  Laterality: N/A;   CORONARY STENT INTERVENTION N/A 05/23/2017   Procedure: CORONARY STENT INTERVENTION;  Surgeon: Kathleene Hazel, MD;  Location: [facility] INVASIVE CV LAB;  Service: Cardiovascular;  Laterality: N/A;   CORONARY/GRAFT ACUTE MI REVASCULARIZATION N/A 07/21/2022   Procedure: Coronary/Graft Acute MI Revascularization;  Surgeon: Orbie Pyo, MD;  Location: [facility] INVASIVE CV LAB;  Service: Cardiovascular;  Laterality: N/A;   INSERTION PROSTATE RADIATION SEED  2007   LEFT HEART CATH AND CORONARY ANGIOGRAPHY N/A 09/20/2016   Procedure: Left Heart Cath and Coronary Angiography;  Surgeon: Marykay Lex, MD;  Location: [facility] INVASIVE CV LAB;  Service: Cardiovascular;  Laterality: N/A;   LEFT HEART CATH AND CORONARY ANGIOGRAPHY N/A 05/23/2017   Procedure: LEFT HEART CATH AND CORONARY ANGIOGRAPHY;  Surgeon: Kathleene Hazel, MD;  Location: [facility] INVASIVE CV LAB;  Service: Cardiovascular;  Laterality: N/A;   LEFT HEART CATH AND CORONARY ANGIOGRAPHY N/A 07/21/2022   Procedure: LEFT HEART CATH AND CORONARY ANGIOGRAPHY;  Surgeon: Orbie Pyo, MD;  Location: [facility] INVASIVE CV LAB;  Service: Cardiovascular;  Laterality: N/A;   PARATHYROIDECTOMY Right 02/26/2018   Procedure: RIGHT INFERIOR PARATHYROIDECTOMY;  Surgeon: Darnell Level, MD;  Location: [facility] ORS;  Service: General;  Laterality: Right;   ROTATOR CUFF REPAIR Right     Allergies  Allergies  Allergen Reactions   Atorvastatin Other (See Comments)    arthralgia Other reaction(s): arthralgias   Codeine Other (See Comments)    "jittery"   Ezetimibe Other (See Comments)    arthralgia Other reaction(s): ? side effect   Zocor [Simvastatin] Other (See Comments)    arthralgia    EKGs/Labs/Other Studies Reviewed:   The following studies were reviewed today: Cardiac Studies & Procedures   CARDIAC CATHETERIZATION  CARDIAC CATHETERIZATION 07/21/2022  Narrative   RPAV lesion is 60%  stenosed.   Ost LAD to Mid LAD lesion is 30% stenosed.   Dist LAD lesion is 50% stenosed.   Prox Cx lesion is 10% stenosed.   Ost Cx to Prox Cx lesion is 50% stenosed.   Prox Cx to Mid Cx lesion is 40% stenosed.   Mid RCA lesion is 20% stenosed.   Ost 1st Diag lesion is 50% stenosed.   1st Diag lesion is 50% stenosed.   2nd Mrg lesion is 50% stenosed.   Ost 1st Mrg lesion is 90% stenosed.   Dist RCA lesion is 50% stenosed.   Prox RCA to Mid RCA lesion is 90% stenosed.   Ost 2nd Mrg-2 lesion is 10% stenosed.  Non-stenotic Ost 2nd Mrg-1 lesion was previously treated.   A stent was successfully placed.   Post intervention, there is a 0% residual stenosis.  1.  High-grade proximal right coronary artery lesion treated with 1 drug-eluting stent. 2.  Patent left circumflex and obtuse marginal stents with mild diffuse disease elsewhere. 3.  Highly elevated LVEDP of 26 mmHg; the patient received 20 mg of IV Lasix in the cardiac catheterization laboratory. 4.  Left ventricular function looks to be moderately diminished.;  Obtain formal echocardiogram to evaluate.  Recommendation: Dual antiplatelet therapy for at least 1 year and optimal medical therapy.  Findings Coronary Findings Diagnostic  Dominance: Right  Left Anterior Descending Ost LAD to Mid LAD lesion is 30% stenosed. Dist LAD lesion is 50% stenosed.  First Diagonal Branch Vessel is small in size. Ost 1st Diag lesion is 50% stenosed. 1st Diag lesion is 50% stenosed.  Left Circumflex Ost Cx to Prox Cx lesion is 50% stenosed. Prox Cx lesion is 10% stenosed. The lesion was previously treated using a drug eluting stent between 1-2 years ago. Prox Cx to Mid Cx lesion is 40% stenosed.  First Obtuse Marginal Branch Vessel is small in size. Ost 1st Mrg lesion is 90% stenosed.  Second Obtuse Marginal Branch Vessel is moderate in size. Non-stenotic Ost 2nd Mrg-1 lesion was previously treated. Ost 2nd Mrg-2 lesion is 10%  stenosed. The lesion was previously treated . 2nd Mrg lesion is 50% stenosed.  Right Coronary Artery Vessel is large. Prox RCA to Mid RCA lesion is 90% stenosed. Mid RCA lesion is 20% stenosed. Dist RCA lesion is 50% stenosed.  Right Posterior Atrioventricular Artery RPAV lesion is 60% stenosed.  Intervention  Prox RCA to Mid RCA lesion Stent A stent was successfully placed. Post-Intervention Lesion Assessment The intervention was successful. Pre-interventional TIMI flow is 3. Post-intervention TIMI flow is 3. There is a 0% residual stenosis post intervention.   CARDIAC CATHETERIZATION  CARDIAC CATHETERIZATION 05/23/2017  Narrative  Mid RCA lesion is 20% stenosed.  Post Atrio lesion is 50% stenosed.  Ost 1st Mrg lesion is 90% stenosed.  Prox Cx lesion is 10% stenosed.  Ost 2nd Mrg-1 lesion is 10% stenosed.  Ost 2nd Mrg-2 lesion is 80% stenosed.  Ost Cx to Prox Cx lesion is 30% stenosed.  A drug-eluting stent was successfully placed using a STENT SYNERGY DES 2.25X12.  Post intervention, there is a 0% residual stenosis.  Post intervention, there is a 0% residual stenosis.  2nd Mrg lesion is 50% stenosed.  Prox Cx to Mid Cx lesion is 40% stenosed.  Ost LAD to Mid LAD lesion is 30% stenosed.  Ost 1st Diag lesion is 50% stenosed.  1st Diag lesion is 50% stenosed.  Dist LAD lesion is 50% stenosed.  The left ventricular systolic function is normal.  LV end diastolic pressure is normal.  The left ventricular ejection fraction is 50-55% by visual estimate.  There is no mitral valve regurgitation.  1. Non-obstructive diffuse disease in the LAD 2. Severe stenosis very small caliber intermediate branch. Unchanged from last cath and too small for PCI 3. Severe stenosis OM1 just beyond old stent. Successful PTCA/DES x 1 OM1. 4. Non-obstructive disease in the large dominant RCA 5. Normal LV systolic function.  Recommendations: Continue ASA, Plavix and statin.  Same day discharge if stable.  Findings Coronary Findings Diagnostic  Dominance: Right  Left Anterior Descending Ost LAD to Mid LAD lesion is 30% stenosed. Dist LAD lesion is 50% stenosed.  First Product manager  Vessel is small in size. Ost 1st Diag lesion is 50% stenosed. 1st Diag lesion is 50% stenosed.  Left Circumflex Ost Cx to Prox Cx lesion is 30% stenosed. Prox Cx lesion is 10% stenosed. The lesion was previously treated using a drug eluting stent between 1-2 years ago. Prox Cx to Mid Cx lesion is 40% stenosed.  First Obtuse Marginal Branch Vessel is small in size. Ost 1st Mrg lesion is 90% stenosed.  Second Obtuse Marginal Branch Vessel is moderate in size. Ost 2nd Mrg-1 lesion is 10% stenosed. The lesion was previously treated using a drug eluting stent between 1-2 years ago. Ost 2nd Mrg-2 lesion is 80% stenosed. 2nd Mrg lesion is 50% stenosed.  Right Coronary Artery Vessel is large. Mid RCA lesion is 20% stenosed.  Right Posterior Atrioventricular Artery Post Atrio lesion is 50% stenosed.  Intervention  Ost 2nd Mrg-1 lesion Stent (Also treats lesions: Ost 2nd Mrg-2) Lesion crossed with guidewire. Pre-stent angioplasty was performed using a BALLOON EMERGE MR 2.0X8. A drug-eluting stent was successfully placed using a STENT SYNERGY DES 2.25X12. Stent overlaps previously placed stent. Post-stent angioplasty was performed using a BALLOON [place] EMERGE MR 2.25X6. Post-Intervention Lesion Assessment There is a 0% residual stenosis post intervention.  Ost 2nd Mrg-2 lesion Stent (Also treats lesions: Ost 2nd Mrg-1) See details in [employer] 2nd Mrg-1 lesion. Post-Intervention Lesion Assessment There is a 0% residual stenosis post intervention.   STRESS TESTS  MYOCARDIAL PERFUSION IMAGING 07/21/2020  Narrative  The left ventricular ejection fraction is normal (55-65%).  Nuclear stress EF: 57%.  Blood pressure demonstrated a hypertensive response to exercise.  This  is a low risk study.  Exercise time: 9:20 min Workload: 10.7 METS, above average exercise capacity Test stopped due to: THR met BP response: hypertensive 236/83 mmHg with peak stress HR response: normal Rhythm: SR. ECG: horizontal ST segment depression of 3 mm was noted during stress in the II, III, aVF, V4, V5 and V6 leads, beginning at 7 minutes of stress, and returning to baseline after 5-9 minutes of recovery.  No ischemia or infarction on perfusion images. Normal wall motion.   ECHOCARDIOGRAM  ECHOCARDIOGRAM COMPLETE 07/21/2022  Narrative ECHOCARDIOGRAM REPORT    Patient Name:   Brian Hudson Date of Exam: 07/21/2022 Medical Rec #:  [id number]    Height:       69.0 in Accession #:    [id number]   Weight:       168.0 lb Date of Birth:  December 07, 1950   BSA:          1.918 m Patient Age:    71 years     BP:           130/74 mmHg Patient Gender: M            HR:           58 bpm. Exam Location:  Inpatient  Procedure: 2D Echo, Cardiac Doppler and Color Doppler  Indications:    Acute Myocardial Infarction  History:        Patient has prior history of Echocardiogram examinations, most recent 06/12/2020. Acute MI and CAD, Signs/Symptoms:Dyspnea; Risk Factors:Hypertension, Former Smoker and Diabetes.  Sonographer:    Lucy Antigua Referring Phys: [id number] Orbie Pyo   Sonographer Comments: Suboptimal parasternal window. IMPRESSIONS   1. Limited study and Definity contrast not used so cannot assess wall motion. Recommend repeating limited study with contrast to better evaluate. 2. Left ventricular ejection fraction, by estimation, is 55 to 60% although LV  endocardium not well visualized. As above, recommend repeating limited study with definity to better assess. Left ventricular endocardial border not optimally defined to evaluate regional wall motion. Left ventricular diastolic parameters are consistent with Grade I diastolic dysfunction (impaired relaxation). 3. Right  ventricular systolic function is normal. The right ventricular size is normal. 4. The mitral valve is normal in structure. Trivial mitral valve regurgitation. 5. The aortic valve was not well visualized. Aortic valve regurgitation is not visualized. Aortic valve sclerosis/calcification is present, without any evidence of aortic stenosis. 6. The inferior vena cava is normal in size with greater than 50% respiratory variability, suggesting right atrial pressure of 3 mmHg.  FINDINGS Left Ventricle: Left ventricular ejection fraction, by estimation, is 55 to 60%. The left ventricle has normal function. Left ventricular endocardial border not optimally defined to evaluate regional wall motion. The left ventricular internal cavity size was normal in size. There is no left ventricular hypertrophy. Left ventricular diastolic parameters are consistent with Grade I diastolic dysfunction (impaired relaxation).  Right Ventricle: The right ventricular size is normal. No increase in right ventricular wall thickness. Right ventricular systolic function is normal.  Left Atrium: Left atrial size was normal in size.  Right Atrium: Right atrial size was normal in size.  Pericardium: There is no evidence of pericardial effusion.  Mitral Valve: The mitral valve is normal in structure. Trivial mitral valve regurgitation.  Tricuspid Valve: The tricuspid valve is normal in structure. Tricuspid valve regurgitation is not demonstrated.  Aortic Valve: The aortic valve was not well visualized. Aortic valve regurgitation is not visualized. Aortic valve sclerosis/calcification is present, without any evidence of aortic stenosis. Aortic valve mean gradient measures 7.0 mmHg. Aortic valve peak gradient measures 12.7 mmHg. Aortic valve area, by VTI measures 1.67 cm.  Aorta: The aortic root is normal in size and structure.  Venous: The inferior vena cava is normal in size with greater than 50% respiratory variability,  suggesting right atrial pressure of 3 mmHg.  IAS/Shunts: The atrial septum is grossly normal.   LEFT VENTRICLE PLAX 2D LVIDd:         4.10 cm     Diastology LVIDs:         3.30 cm     LV e' medial:    3.26 cm/s LV PW:         1.00 cm     LV E/e' medial:  18.3 LV IVS:        1.00 cm     LV e' lateral:   5.55 cm/s LVOT diam:     2.00 cm     LV E/e' lateral: 10.7 LV SV:         63 LV SV Index:   33 LVOT Area:     3.14 cm  LV Volumes (MOD) LV vol d, MOD A4C: 56.4 ml LV vol s, MOD A4C: 18.8 ml LV SV MOD A4C:     56.4 ml  RIGHT VENTRICLE RV S prime:     13.70 cm/s TAPSE (M-mode): 2.1 cm  LEFT ATRIUM             Index        RIGHT ATRIUM           Index LA Vol (A2C):   47.0 ml 24.50 ml/m  RA Area:     11.60 cm LA Vol (A4C):   32.1 ml 16.73 ml/m  RA Volume:   24.20 ml  12.61 ml/m LA Biplane Vol: 39.7 ml 20.69 ml/m  AORTIC VALVE AV Area (Vmax):    1.51 cm AV Area (Vmean):   1.35 cm AV Area (VTI):     1.67 cm AV Vmax:           178.00 cm/s AV Vmean:          122.000 cm/s AV VTI:            0.378 m AV Peak Grad:      12.7 mmHg AV Mean Grad:      7.0 mmHg LVOT Vmax:         85.50 cm/s LVOT Vmean:        52.500 cm/s LVOT VTI:          0.201 m LVOT/AV VTI ratio: 0.53  AORTA Ao Root diam: 2.80 cm Ao Asc diam:  3.10 cm  MITRAL VALVE MV Area (PHT): 2.61 cm    SHUNTS MV Decel Time: 291 msec    Systemic VTI:  0.20 m MV E velocity: 59.60 cm/s  Systemic Diam: 2.00 cm MV A velocity: 93.80 cm/s MV E/A ratio:  0.64  Laurance Flatten MD Electronically signed by Laurance Flatten MD Signature Date/Time: 07/21/2022/5:28:01 PM    Final              EKG:  EKG is not ordered today.    Recent Labs: 07/21/2022: ALT 28 07/22/2022: BUN 22; Creatinine, Ser 1.67; Hemoglobin 14.7; Platelets 204; Potassium 4.4; Sodium 138  Recent Lipid Panel    Component Value Date/Time   CHOL 142 07/21/2022 0501   CHOL 122 10/02/2018 0845   TRIG 89 07/21/2022 0501   HDL 37 (L) 07/21/2022  0501   HDL 35 (L) 10/02/2018 0845   CHOLHDL 3.8 07/21/2022 0501   VLDL 18 07/21/2022 0501   LDLCALC 87 07/21/2022 0501   LDLCALC 69 10/02/2018 0845    Home Medications   Current Meds  Medication Sig   acetaminophen (TYLENOL) 325 MG tablet Take 650 mg by mouth every morning.   aspirin 81 MG chewable tablet Chew 1 tablet (81 mg total) by mouth daily.   empagliflozin (JARDIANCE) 25 MG TABS tablet Take 25 mg by mouth daily.   escitalopram (LEXAPRO) 10 MG tablet Take 10 mg by mouth daily.   Fluticasone Furoate (ARNUITY ELLIPTA) 100 MCG/ACT AEPB Inhale 1 puff into the lungs daily.   Multiple Vitamin (MULTIVITAMIN WITH MINERALS) TABS tablet Take 1 tablet by mouth daily.   nitroGLYCERIN (NITROSTAT) 0.4 MG SL tablet Place 1 tablet (0.4 mg total) under the tongue every 5 (five) minutes x 3 doses as needed for chest pain.   pantoprazole (PROTONIX) 40 MG tablet Take 1 tablet (40 mg total) by mouth 2 (two) times daily.   rosuvastatin (CRESTOR) 40 MG tablet Take 1 tablet (40 mg total) by mouth daily.   Semaglutide,0.25 or 0.5MG /DOS, (OZEMPIC, 0.25 OR 0.5 MG/DOSE,) 2 MG/3ML SOPN 0.25mg  weekly for 4 weeks then 0.5mg  weekly after that   Study - EVOLVE-MI - evolocumab (REPATHA) 140 mg/mL SQ injection (PI-Stuckey) Inject 1 mL (140 mg total) into the skin every 14 (fourteen) days. For Investigational Use Only. Inject subcutaneously into abdomen, thigh, or upper arm. Rotate injection sites and do not inject into areas where skin is tender, bruised, or red. please contact [facility] ([phone]) for any questions or concerns regarding the medication   ticagrelor (BRILINTA) 90 MG TABS tablet Take 1 tablet (90 mg total) by mouth 2 (two) times daily.   valsartan (DIOVAN) 80 MG tablet 1 tablet   [DISCONTINUED] SYMBICORT 160-4.5  MCG/ACT inhaler Inhale 2 puffs into the lungs 2 (two) times daily.     Review of Systems      All other systems reviewed and are otherwise negative except as noted above.  Physical Exam    VS:   BP 132/60   Pulse (!) 56   Ht 5\' 9"  (1.753 m)   Wt 159 lb 9.6 oz (72.4 kg)   SpO2 98%   BMI 23.57 kg/m  , BMI Body mass index is 23.57 kg/m.  Wt Readings from Last 3 Encounters:  08/02/22 159 lb 9.6 oz (72.4 kg)  07/21/22 168 lb (76.2 kg)  11/16/21 168 lb 6.4 oz (76.4 kg)     GEN: Well nourished, well developed, in no acute distress. HEENT: normal. Neck: Supple, no JVD, carotid bruits, or masses. Cardiac: RRR, no murmurs, rubs, or gallops. No clubbing, cyanosis, edema.  Radials/PT 2+ and equal bilaterally.  Respiratory:  Respirations regular and unlabored, clear to auscultation bilaterally. GI: Soft, nontender, nondistended. MS: No deformity or atrophy. Skin: Warm and dry, no rash. ecchymosis on right forearm where cardiac cath site is. Neuro:  Strength and sensation are intact. Psych: Normal affect.  Assessment & Plan    Inferior STEMI status post PCI -No chest pain -Continue current medication regimen which includes aspirin 81 mg daily, Brilinta 90 mg twice a day, valsartan 80 mg daily, Crestor 40 mg daily, nitro as needed, Jardiance 25 mg daily -Encourage participation in cardiac rehab -He does have some ecchymosis surrounding his right cardiac cath site, warm compresses suggested -Continue low-sodium, heart healthy diet -Continue to increase exercise as tolerated  Hypertension -Blood pressure well-controlled today 132/60 -Continue current medication regimen -Continue to monitor blood pressure at home an hour after morning medication  Hyperlipidemia -LDL 87 (07/21/2022) -Goal is 70 -He will need a repeat lipid panel when he next is in the office -Continue Crestor 40 mg daily  Diabetes mellitus -A1c 7.2 -Per primary care         Cardiac Rehabilitation Eligibility Assessment  The patient is ready to start cardiac rehabilitation from a cardiac standpoint.   Disposition: Follow up 3-4 months with Donato Schultz, MD or APP.  Signed, Sharlene Dory,  PA-C 08/02/2022, 10:35 AM [facility]

## 2022-08-02 ENCOUNTER — Encounter: Payer: Self-pay | Admitting: Physician Assistant

## 2022-08-02 ENCOUNTER — Ambulatory Visit: Payer: Medicare Other | Attending: Physician Assistant | Admitting: Physician Assistant

## 2022-08-02 ENCOUNTER — Telehealth: Payer: Self-pay | Admitting: *Deleted

## 2022-08-02 VITALS — BP 132/60 | HR 56 | Ht 69.0 in | Wt 159.6 lb

## 2022-08-02 DIAGNOSIS — I213 ST elevation (STEMI) myocardial infarction of unspecified site: Secondary | ICD-10-CM

## 2022-08-02 DIAGNOSIS — E785 Hyperlipidemia, unspecified: Secondary | ICD-10-CM | POA: Diagnosis not present

## 2022-08-02 DIAGNOSIS — I25119 Atherosclerotic heart disease of native coronary artery with unspecified angina pectoris: Secondary | ICD-10-CM

## 2022-08-02 DIAGNOSIS — I10 Essential (primary) hypertension: Secondary | ICD-10-CM

## 2022-08-02 MED ORDER — SYMBICORT 160-4.5 MCG/ACT IN AERO: 2.0000 | INHALATION_SPRAY | Freq: Two times a day (BID) | RESPIRATORY_TRACT | 0 refills | Status: DC

## 2022-08-02 NOTE — Telephone Encounter (Signed)
Patient just saw Julian Hy for an office visit and requested a refill of this which Julian Hy agreed to give a one time refill but further refills would need to be authorized by PCP. Received fax from [facility] indicating that patients insurance plan does not cover symbicort. Per Julian Hy, patient will need to contact PCP to have them address this. I attempted to reach patient to make him aware but did not get an answer but I did leave a VM for him to return the call.

## 2022-08-02 NOTE — Patient Instructions (Signed)
Medication Instructions:  Your physician recommends that you continue on your current medications as directed. Please refer to the Current Medication list given to you today.  *If you need a refill on your cardiac medications before your next appointment, please call your pharmacy*  Lab Work: None ordered If you have labs (blood work) drawn today and your tests are completely normal, you will receive your results only by: MyChart Message (if you have MyChart) OR A paper copy in the mail If you have any lab test that is abnormal or we need to change your treatment, we will call you to review the results.  Follow-Up: At [facility] you and your health needs are our priority.  As part of our continuing mission to provide you with exceptional heart care, we have created designated Provider Care Teams.  These Care Teams include your primary Cardiologist (physician) and Advanced Practice Providers (APPs -  Physician Assistants and Nurse Practitioners) who all work together to provide you with the care you need, when you need it.  Your next appointment:   3-4 month(s)  Provider:   Donato Schultz, MD    Other Instructions Check your blood pressure daily, 1-2 hrs after morning medications for 2 weeks, keep a log and send [place] the readings through mychart at the end of the 2 weeks  Low-Sodium Eating Plan Sodium, which is an element that makes up salt, helps you maintain a healthy balance of fluids in your body. Too much sodium can increase your blood pressure and cause fluid and waste to be held in your body. Your health care provider or dietitian may recommend following this plan if you have high blood pressure (hypertension), kidney disease, liver disease, or heart failure. Eating less sodium can help lower your blood pressure, reduce swelling, and protect your heart, liver, and kidneys. What are tips for following this plan? Reading food labels The Nutrition Facts label lists the amount of  sodium in one serving of the food. If you eat more than one serving, you must multiply the listed amount of sodium by the number of servings. Choose foods with less than 140 mg of sodium per serving. Avoid foods with 300 mg of sodium or more per serving. Shopping  Look for lower-sodium products, often labeled as "low-sodium" or "no salt added." Always check the sodium content, even if foods are labeled as "unsalted" or "no salt added." Buy fresh foods. Avoid canned foods and pre-made or frozen meals. Avoid canned, cured, or processed meats. Buy breads that have less than 80 mg of sodium per slice. Cooking  Eat more home-cooked food and less restaurant, buffet, and fast food. Avoid adding salt when cooking. Use salt-free seasonings or herbs instead of table salt or sea salt. Check with your health care provider or pharmacist before using salt substitutes. Cook with plant-based oils, such as canola, sunflower, or olive oil. Meal planning When eating at a restaurant, ask that your food be prepared with less salt or no salt, if possible. Avoid dishes labeled as brined, pickled, cured, smoked, or made with soy sauce, miso, or teriyaki sauce. Avoid foods that contain MSG (monosodium glutamate). MSG is sometimes added to [place] food, bouillon, and some canned foods. Make meals that can be grilled, baked, poached, roasted, or steamed. These are generally made with less sodium. General information Most people on this plan should limit their sodium intake to 1,500-2,000 mg (milligrams) of sodium each day. What foods should I eat? Fruits Fresh, frozen, or canned  fruit. Fruit juice. Vegetables Fresh or frozen vegetables. "No salt added" canned vegetables. "No salt added" tomato sauce and paste. Low-sodium or reduced-sodium tomato and vegetable juice. Grains Low-sodium cereals, including oats, puffed wheat and rice, and shredded wheat. Low-sodium crackers. Unsalted rice. Unsalted pasta. Low-sodium  bread. Whole-grain breads and whole-grain pasta. Meats and other proteins Fresh or frozen (no salt added) meat, poultry, seafood, and fish. Low-sodium canned tuna and salmon. Unsalted nuts. Dried peas, beans, and lentils without added salt. Unsalted canned beans. Eggs. Unsalted nut butters. Dairy Milk. Soy milk. Cheese that is naturally low in sodium, such as ricotta cheese, fresh mozzarella, or Swiss cheese. Low-sodium or reduced-sodium cheese. Cream cheese. Yogurt. Seasonings and condiments Fresh and dried herbs and spices. Salt-free seasonings. Low-sodium mustard and ketchup. Sodium-free salad dressing. Sodium-free light mayonnaise. Fresh or refrigerated horseradish. Lemon juice. Vinegar. Other foods Homemade, reduced-sodium, or low-sodium soups. Unsalted popcorn and pretzels. Low-salt or salt-free chips. The items listed above may not be a complete list of foods and beverages you can eat. Contact a dietitian for more information. What foods should I avoid? Vegetables Sauerkraut, pickled vegetables, and relishes. Olives. [place] fries. Onion rings. Regular canned vegetables (not low-sodium or reduced-sodium). Regular canned tomato sauce and paste (not low-sodium or reduced-sodium). Regular tomato and vegetable juice (not low-sodium or reduced-sodium). Frozen vegetables in sauces. Grains Instant hot cereals. Bread stuffing, pancake, and biscuit mixes. Croutons. Seasoned rice or pasta mixes. Noodle soup cups. Boxed or frozen macaroni and cheese. Regular salted crackers. Self-rising flour. Meats and other proteins Meat or fish that is salted, canned, smoked, spiced, or pickled. Precooked or cured meat, such as sausages or meat loaves. Tomasa Blase. Ham. Pepperoni. Hot dogs. Corned beef. Chipped beef. Salt pork. Jerky. Pickled herring. Anchovies and sardines. Regular canned tuna. Salted nuts. Dairy Processed cheese and cheese spreads. Hard cheeses. Cheese curds. Blue cheese. Feta cheese. String cheese.  Regular cottage cheese. Buttermilk. Canned milk. Fats and oils Salted butter. Regular margarine. Ghee. Bacon fat. Seasonings and condiments Onion salt, garlic salt, seasoned salt, table salt, and sea salt. Canned and packaged gravies. Worcestershire sauce. Tartar sauce. Barbecue sauce. Teriyaki sauce. Soy sauce, including reduced-sodium. Steak sauce. Fish sauce. Oyster sauce. Cocktail sauce. Horseradish that you find on the shelf. Regular ketchup and mustard. Meat flavorings and tenderizers. Bouillon cubes. Hot sauce. Pre-made or packaged marinades. Pre-made or packaged taco seasonings. Relishes. Regular salad dressings. Salsa. Other foods Salted popcorn and pretzels. Corn chips and puffs. Potato and tortilla chips. Canned or dried soups. Pizza. Frozen entrees and pot pies. The items listed above may not be a complete list of foods and beverages you should avoid. Contact a dietitian for more information. Summary Eating less sodium can help lower your blood pressure, reduce swelling, and protect your heart, liver, and kidneys. Most people on this plan should limit their sodium intake to 1,500-2,000 mg (milligrams) of sodium each day. Canned, boxed, and frozen foods are high in sodium. Restaurant foods, fast foods, and pizza are also very high in sodium. You also get sodium by adding salt to food. Try to cook at home, eat more fresh fruits and vegetables, and eat less fast food and canned, processed, or prepared foods. This information is not intended to replace advice given to you by your health care provider. Make sure you discuss any questions you have with your health care provider. Document Revised: 03/01/2019 Document Reviewed: 02/24/2019 Elsevier Patient Education  2023 [employer]  Heart-Healthy Eating Plan Many factors influence your heart health, including eating  and exercise habits. Heart health is also called coronary health. Coronary risk increases with abnormal blood fat (lipid)  levels. A heart-healthy eating plan includes limiting unhealthy fats, increasing healthy fats, limiting salt (sodium) intake, and making other diet and lifestyle changes. What is my plan? Your health care provider may recommend that: You limit your fat intake to _________% or less of your total calories each day. You limit your saturated fat intake to _________% or less of your total calories each day. You limit the amount of cholesterol in your diet to less than _________ mg per day. You limit the amount of sodium in your diet to less than _________ mg per day. What are tips for following this plan? Cooking Cook foods using methods other than frying. Baking, boiling, grilling, and broiling are all good options. Other ways to reduce fat include: Removing the skin from poultry. Removing all visible fats from meats. Steaming vegetables in water or broth. Meal planning  At meals, imagine dividing your plate into fourths: Fill one-half of your plate with vegetables and green salads. Fill one-fourth of your plate with whole grains. Fill one-fourth of your plate with lean protein foods. Eat 2-4 cups of vegetables per day. One cup of vegetables equals 1 cup (91 g) broccoli or cauliflower florets, 2 medium carrots, 1 large bell pepper, 1 large sweet potato, 1 large tomato, 1 medium white potato, 2 cups (150 g) raw leafy greens. Eat 1-2 cups of fruit per day. One cup of fruit equals 1 small apple, 1 large banana, 1 cup (237 g) mixed fruit, 1 large orange,  cup (82 g) dried fruit, 1 cup (240 mL) 100% fruit juice. Eat more foods that contain soluble fiber. Examples include apples, broccoli, carrots, beans, peas, and barley. Aim to get 25-30 g of fiber per day. Increase your consumption of legumes, nuts, and seeds to 4-5 servings per week. One serving of dried beans or legumes equals  cup (90 g) cooked, 1 serving of nuts is  oz (12 almonds, 24 pistachios, or 7 walnut halves), and 1 serving of seeds  equals  oz (8 g). Fats Choose healthy fats more often. Choose monounsaturated and polyunsaturated fats, such as olive and canola oils, avocado oil, flaxseeds, walnuts, almonds, and seeds. Eat more omega-3 fats. Choose salmon, mackerel, sardines, tuna, flaxseed oil, and ground flaxseeds. Aim to eat fish at least 2 times each week. Check food labels carefully to identify foods with trans fats or high amounts of saturated fat. Limit saturated fats. These are found in animal products, such as meats, butter, and cream. Plant sources of saturated fats include palm oil, palm kernel oil, and coconut oil. Avoid foods with partially hydrogenated oils in them. These contain trans fats. Examples are stick margarine, some tub margarines, cookies, crackers, and other baked goods. Avoid fried foods. General information Eat more home-cooked food and less restaurant, buffet, and fast food. Limit or avoid alcohol. Limit foods that are high in added sugar and simple starches such as foods made using white refined flour (white breads, pastries, sweets). Lose weight if you are overweight. Losing just 5-10% of your body weight can help your overall health and prevent diseases such as diabetes and heart disease. Monitor your sodium intake, especially if you have high blood pressure. Talk with your health care provider about your sodium intake. Try to incorporate more vegetarian meals weekly. What foods should I eat? Fruits All fresh, canned (in natural juice), or frozen fruits. Vegetables Fresh or frozen vegetables (raw,  steamed, roasted, or grilled). Green salads. Grains Most grains. Choose whole wheat and whole grains most of the time. Rice and pasta, including brown rice and pastas made with whole wheat. Meats and other proteins Lean, well-trimmed beef, veal, pork, and lamb. Chicken and [place] without skin. All fish and shellfish. Wild duck, rabbit, pheasant, and venison. Egg whites or low-cholesterol egg  substitutes. Dried beans, peas, lentils, and tofu. Seeds and most nuts. Dairy Low-fat or nonfat cheeses, including ricotta and mozzarella. Skim or 1% milk (liquid, powdered, or evaporated). Buttermilk made with low-fat milk. Nonfat or low-fat yogurt. Fats and oils Non-hydrogenated (trans-free) margarines. Vegetable oils, including soybean, sesame, sunflower, olive, avocado, peanut, safflower, corn, canola, and cottonseed. Salad dressings or mayonnaise made with a vegetable oil. Beverages Water (mineral or sparkling). Coffee and tea. Unsweetened ice tea. Diet beverages. Sweets and desserts Sherbet, gelatin, and fruit ice. Small amounts of dark chocolate. Limit all sweets and desserts. Seasonings and condiments All seasonings and condiments. The items listed above may not be a complete list of foods and beverages you can eat. Contact a dietitian for more options. What foods should I avoid? Fruits Canned fruit in heavy syrup. Fruit in cream or butter sauce. Fried fruit. Limit coconut. Vegetables Vegetables cooked in cheese, cream, or butter sauce. Fried vegetables. Grains Breads made with saturated or trans fats, oils, or whole milk. Croissants. Sweet rolls. Donuts. High-fat crackers, such as cheese crackers and chips. Meats and other proteins Fatty meats, such as hot dogs, ribs, sausage, bacon, rib-eye roast or steak. High-fat deli meats, such as salami and bologna. Caviar. Domestic duck and goose. Organ meats, such as liver. Dairy Cream, sour cream, cream cheese, and creamed cottage cheese. Whole-milk cheeses. Whole or 2% milk (liquid, evaporated, or condensed). Whole buttermilk. Cream sauce or high-fat cheese sauce. Whole-milk yogurt. Fats and oils Meat fat, or shortening. Cocoa butter, hydrogenated oils, palm oil, coconut oil, palm kernel oil. Solid fats and shortenings, including bacon fat, salt pork, lard, and butter. Nondairy cream substitutes. Salad dressings with cheese or sour  cream. Beverages Regular sodas and any drinks with added sugar. Sweets and desserts Frosting. Pudding. Cookies. Cakes. Pies. Milk chocolate or white chocolate. Buttered syrups. Full-fat ice cream or ice cream drinks. The items listed above may not be a complete list of foods and beverages to avoid. Contact a dietitian for more information. Summary Heart-healthy meal planning includes limiting unhealthy fats, increasing healthy fats, limiting salt (sodium) intake and making other diet and lifestyle changes. Lose weight if you are overweight. Losing just 5-10% of your body weight can help your overall health and prevent diseases such as diabetes and heart disease. Focus on eating a balance of foods, including fruits and vegetables, low-fat or nonfat dairy, lean protein, nuts and legumes, whole grains, and heart-healthy oils and fats. This information is not intended to replace advice given to you by your health care provider. Make sure you discuss any questions you have with your health care provider. Document Revised: 04/30/2021 Document Reviewed: 04/30/2021 Elsevier Patient Education  2023 [employer].

## 2022-08-06 ENCOUNTER — Other Ambulatory Visit: Payer: Self-pay

## 2022-08-06 MED ORDER — SYMBICORT 160-4.5 MCG/ACT IN AERO: 2.0000 | INHALATION_SPRAY | Freq: Two times a day (BID) | RESPIRATORY_TRACT | 3 refills | Status: DC

## 2022-08-07 ENCOUNTER — Telehealth (HOSPITAL_COMMUNITY): Payer: Self-pay

## 2022-08-07 NOTE — Telephone Encounter (Signed)
Called and spoke with pt in regards to CR, pt stated he is not interested at this time.   Closed referral 

## 2022-09-03 ENCOUNTER — Other Ambulatory Visit (HOSPITAL_COMMUNITY): Payer: Self-pay

## 2022-09-20 ENCOUNTER — Other Ambulatory Visit (HOSPITAL_COMMUNITY): Payer: Self-pay

## 2022-11-01 ENCOUNTER — Encounter: Payer: Self-pay | Admitting: *Deleted

## 2022-11-01 ENCOUNTER — Encounter: Payer: Self-pay | Admitting: Physician Assistant

## 2022-11-01 ENCOUNTER — Ambulatory Visit: Payer: Medicare Other | Admitting: Physician Assistant

## 2022-11-01 ENCOUNTER — Ambulatory Visit: Payer: Medicare Other | Attending: Physician Assistant | Admitting: Physician Assistant

## 2022-11-01 VITALS — BP 124/54 | HR 55 | Ht 69.0 in | Wt 161.0 lb

## 2022-11-01 DIAGNOSIS — I10 Essential (primary) hypertension: Secondary | ICD-10-CM

## 2022-11-01 DIAGNOSIS — I130 Hypertensive heart and chronic kidney disease with heart failure and stage 1 through stage 4 chronic kidney disease, or unspecified chronic kidney disease: Secondary | ICD-10-CM | POA: Diagnosis not present

## 2022-11-01 DIAGNOSIS — E785 Hyperlipidemia, unspecified: Secondary | ICD-10-CM | POA: Diagnosis not present

## 2022-11-01 DIAGNOSIS — I2510 Atherosclerotic heart disease of native coronary artery without angina pectoris: Secondary | ICD-10-CM | POA: Diagnosis not present

## 2022-11-01 DIAGNOSIS — M3130 Wegener's granulomatosis without renal involvement: Secondary | ICD-10-CM | POA: Diagnosis not present

## 2022-11-01 DIAGNOSIS — F322 Major depressive disorder, single episode, severe without psychotic features: Secondary | ICD-10-CM | POA: Diagnosis not present

## 2022-11-01 DIAGNOSIS — R0989 Other specified symptoms and signs involving the circulatory and respiratory systems: Secondary | ICD-10-CM

## 2022-11-01 DIAGNOSIS — I213 ST elevation (STEMI) myocardial infarction of unspecified site: Secondary | ICD-10-CM

## 2022-11-01 DIAGNOSIS — Z006 Encounter for examination for normal comparison and control in clinical research program: Secondary | ICD-10-CM

## 2022-11-01 DIAGNOSIS — E1122 Type 2 diabetes mellitus with diabetic chronic kidney disease: Secondary | ICD-10-CM | POA: Diagnosis not present

## 2022-11-01 NOTE — Patient Instructions (Signed)
Medication Instructions:  No changes *If you need a refill on your cardiac medications before your next appointment, please call your pharmacy*   Lab Work: none   Testing/Procedures: Your physician has requested that you have a carotid duplex. This test is an ultrasound of the carotid arteries in your neck. It looks at blood flow through these arteries that supply the brain with blood. Allow one hour for this exam. There are no restrictions or special instructions.   Follow-Up: At [facility], you and your health needs are our priority.  As part of our continuing mission to provide you with exceptional heart care, we have created designated Provider Care Teams.  These Care Teams include your primary Cardiologist (physician) and Advanced Practice Providers (APPs -  Physician Assistants and Nurse Practitioners) who all work together to provide you with the care you need, when you need it.    Your next appointment:   6 month(s)  Provider:   Jari Favre, PA-C OR  Donato Schultz, MD

## 2022-11-01 NOTE — Progress Notes (Signed)
Office Visit    Patient Name: Brian Hudson Date of Encounter: 11/01/2022  PCP:  Emilio Aspen, MD   [facility]  Cardiologist:  Donato Schultz, MD  Advanced Practice Provider:  No care team member to display Electrophysiologist:  None   hpi    Brian Hudson is a 72 y.o. male with a past medical history significant for type 2 diabetes mellitus, HLD, HTN, GERD and CAD status post PCI to OM in 2019 who was seen 07/21/2022 for the evaluation of inferior STEMI presents today for follow-up appointment.  He has a history of Wegener's granulomatosis as well.  He has been doing well until the day of admission when he suddenly woke up at 1 AM with chest pain radiating to his jaw associated with nausea and vomiting.  10 out of 10 central chest pain radiating to the jaw and shoulder.  Called EMS and was found to have inferior STEMI on EKG.  He was then brought to the hospital.  2 nitro's and 4 mg of Zofran.  324 mg of aspirin are given and route.  Pain improved after that to a 5 out of 10.  BP was 160/100.  Cardiology was called for admission.  Denies any smoking.  Unsure of which medications he had been taking.  He ultimately went to cardiac catheterization and found to have high-grade proximal RCA lesion treated with PCI/DES x 1, left circumflex and OM stents.  Recommended for DAPT with aspirin and Brilinta for at least a year.  Seen by cardiac rehab no recurrent chest pain.  He was seen by me 4/26, he tells me that he has no chest pains.  He is pretty much always short of breath and this has not changed.  Repatha is a new medication and he is tolerating this well.  He is also on valsartan 80 mg daily.  This has been held in the past due to hypotension but blood pressure stable today at 132/60.  Also provided some lab work today from his renal doctor and kidneys have remained stable with creatinine around 1.6 which has been the case for the last 2 years.  He stated he is gone  back to his walking routine and is walking about a mile a day.  Encouraged him to increase walking time slowly over time and also encouraged cardiac rehab as well as the cooking classes that are offered.  Today, he tells me he was having some lightheaded spells when he was walking into [employer] and he would slow down and take his time.  No palpitations or chest pain at this time.  Some shortness of breath with long conversation respiratory failure losing his breath.  No shortness of breath with activity.  Overall, feels good today.  No pain.  He does like today with recent dancing at a wedding. Rare drinking.  He does admit that he does not drink enough water.  Does drink a couple cups of coffee a morning and evening.  Suggested increasing water intake to 6 to 8 cups a day.  We did discuss staying on DAPT until April 2025.  Holding losartan for low blood pressure.  Reports no shortness of breath on exertion. Reports no chest pain, pressure, or tightness. No edema, orthopnea, PND. Reports no palpitations.   Past Medical History    Past Medical History:  Diagnosis Date   Arthritis    osteoarthritis    CAD (coronary artery disease)    a. 09/20/16:  45% OM, 99% ostial ramus s/p DES, 80% proximal LCx s/p DES, 80% more distal LCx on AV groove portion of the circumflex after OM1 s/p cutting balloon and PCTA. // Ex Myoview 4/22: 9'20", 10.7 METs, BP 236/83 at pk stress, 3 mm ST depression inf-lat, no symptoms / EF 57, no ischemia or infarction; low risk     Concussion    Depression    DM (diabetes mellitus) (HCC)    type 2   Dyspnea    with exertion    Dysrhythmia    hx of atrial fib- 15-20 years ago    ED (erectile dysfunction)    Fracture 2010   L 3 and L 4 healed with brace   GERD (gastroesophageal reflux disease)    HTN (hypertension)    Hyperlipidemia    IBS (irritable bowel syndrome)    Palpitations    none in last few yrs   Peripheral vascular disease (HCC)    legs    Pneumonia    hx of     Prostate cancer (HCC) 2007   Sigmoid diverticulosis    Wegener's granulomatosis    followed by dr Orlin Hilding   Past Surgical History:  Procedure Laterality Date   CARDIAC CATHETERIZATION     COLONOSCOPY N/A 06/24/2016   Procedure: COLONOSCOPY with propofol;  Surgeon: Charolett Bumpers, MD;  Location: [facility];  Service: Endoscopy;  Laterality: N/A;   colonscopy  2008   CORONARY PRESSURE/FFR STUDY N/A 09/20/2016   Procedure: Intravascular Pressure Wire/FFR Study;  Surgeon: Marykay Lex, MD;  Location: [facility] INVASIVE CV LAB;  Service: Cardiovascular;  Laterality: N/A;   CORONARY STENT INTERVENTION N/A 09/20/2016   Procedure: Coronary Stent Intervention;  Surgeon: Marykay Lex, MD;  Location: [facility] INVASIVE CV LAB;  Service: Cardiovascular;  Laterality: N/A;   CORONARY STENT INTERVENTION N/A 05/23/2017   Procedure: CORONARY STENT INTERVENTION;  Surgeon: Kathleene Hazel, MD;  Location: [facility] INVASIVE CV LAB;  Service: Cardiovascular;  Laterality: N/A;   CORONARY/GRAFT ACUTE MI REVASCULARIZATION N/A 07/21/2022   Procedure: Coronary/Graft Acute MI Revascularization;  Surgeon: Orbie Pyo, MD;  Location: [facility] INVASIVE CV LAB;  Service: Cardiovascular;  Laterality: N/A;   INSERTION PROSTATE RADIATION SEED  2007   LEFT HEART CATH AND CORONARY ANGIOGRAPHY N/A 09/20/2016   Procedure: Left Heart Cath and Coronary Angiography;  Surgeon: Marykay Lex, MD;  Location: [facility] INVASIVE CV LAB;  Service: Cardiovascular;  Laterality: N/A;   LEFT HEART CATH AND CORONARY ANGIOGRAPHY N/A 05/23/2017   Procedure: LEFT HEART CATH AND CORONARY ANGIOGRAPHY;  Surgeon: Kathleene Hazel, MD;  Location: [facility] INVASIVE CV LAB;  Service: Cardiovascular;  Laterality: N/A;   LEFT HEART CATH AND CORONARY ANGIOGRAPHY N/A 07/21/2022   Procedure: LEFT HEART CATH AND CORONARY ANGIOGRAPHY;  Surgeon: Orbie Pyo, MD;  Location: [facility] INVASIVE CV LAB;  Service: Cardiovascular;  Laterality: N/A;   PARATHYROIDECTOMY  Right 02/26/2018   Procedure: RIGHT INFERIOR PARATHYROIDECTOMY;  Surgeon: Darnell Level, MD;  Location: [facility] ORS;  Service: General;  Laterality: Right;   ROTATOR CUFF REPAIR Right     Allergies  Allergies  Allergen Reactions   Atorvastatin Other (See Comments)    arthralgia Other reaction(s): arthralgias   Codeine Other (See Comments)    "jittery"   Ezetimibe Other (See Comments)    arthralgia Other reaction(s): ? side effect   Zocor [Simvastatin] Other (See Comments)    arthralgia    EKGs/Labs/Other Studies Reviewed:   The following studies were reviewed today: Cardiac  Studies & Procedures   CARDIAC CATHETERIZATION  CARDIAC CATHETERIZATION 07/21/2022  Narrative   RPAV lesion is 60% stenosed.   Ost LAD to Mid LAD lesion is 30% stenosed.   Dist LAD lesion is 50% stenosed.   Prox Cx lesion is 10% stenosed.   Ost Cx to Prox Cx lesion is 50% stenosed.   Prox Cx to Mid Cx lesion is 40% stenosed.   Mid RCA lesion is 20% stenosed.   Ost 1st Diag lesion is 50% stenosed.   1st Diag lesion is 50% stenosed.   2nd Mrg lesion is 50% stenosed.   Ost 1st Mrg lesion is 90% stenosed.   Dist RCA lesion is 50% stenosed.   Prox RCA to Mid RCA lesion is 90% stenosed.   Ost 2nd Mrg-2 lesion is 10% stenosed.   Non-stenotic Ost 2nd Mrg-1 lesion was previously treated.   A stent was successfully placed.   Post intervention, there is a 0% residual stenosis.  1.  High-grade proximal right coronary artery lesion treated with 1 drug-eluting stent. 2.  Patent left circumflex and obtuse marginal stents with mild diffuse disease elsewhere. 3.  Highly elevated LVEDP of 26 mmHg; the patient received 20 mg of IV Lasix in the cardiac catheterization laboratory. 4.  Left ventricular function looks to be moderately diminished.;  Obtain formal echocardiogram to evaluate.  Recommendation: Dual antiplatelet therapy for at least 1 year and optimal medical therapy.  Findings Coronary Findings Diagnostic   Dominance: Right  Left Anterior Descending Ost LAD to Mid LAD lesion is 30% stenosed. Dist LAD lesion is 50% stenosed.  First Diagonal Branch Vessel is small in size. Ost 1st Diag lesion is 50% stenosed. 1st Diag lesion is 50% stenosed.  Left Circumflex Ost Cx to Prox Cx lesion is 50% stenosed. Prox Cx lesion is 10% stenosed. The lesion was previously treated using a drug eluting stent between 1-2 years ago. Prox Cx to Mid Cx lesion is 40% stenosed.  First Obtuse Marginal Branch Vessel is small in size. Ost 1st Mrg lesion is 90% stenosed.  Second Obtuse Marginal Branch Vessel is moderate in size. Non-stenotic Ost 2nd Mrg-1 lesion was previously treated. Ost 2nd Mrg-2 lesion is 10% stenosed. The lesion was previously treated . 2nd Mrg lesion is 50% stenosed.  Right Coronary Artery Vessel is large. Prox RCA to Mid RCA lesion is 90% stenosed. Mid RCA lesion is 20% stenosed. Dist RCA lesion is 50% stenosed.  Right Posterior Atrioventricular Artery RPAV lesion is 60% stenosed.  Intervention  Prox RCA to Mid RCA lesion Stent A stent was successfully placed. Post-Intervention Lesion Assessment The intervention was successful. Pre-interventional TIMI flow is 3. Post-intervention TIMI flow is 3. There is a 0% residual stenosis post intervention.   CARDIAC CATHETERIZATION  CARDIAC CATHETERIZATION 05/23/2017  Narrative  Mid RCA lesion is 20% stenosed.  Post Atrio lesion is 50% stenosed.  Ost 1st Mrg lesion is 90% stenosed.  Prox Cx lesion is 10% stenosed.  Ost 2nd Mrg-1 lesion is 10% stenosed.  Ost 2nd Mrg-2 lesion is 80% stenosed.  Ost Cx to Prox Cx lesion is 30% stenosed.  A drug-eluting stent was successfully placed using a STENT SYNERGY DES 2.25X12.  Post intervention, there is a 0% residual stenosis.  Post intervention, there is a 0% residual stenosis.  2nd Mrg lesion is 50% stenosed.  Prox Cx to Mid Cx lesion is 40% stenosed.  Ost LAD to Mid LAD  lesion is 30% stenosed.  Ost 1st Diag lesion is 50% stenosed.  1st Diag lesion is 50% stenosed.  Dist LAD lesion is 50% stenosed.  The left ventricular systolic function is normal.  LV end diastolic pressure is normal.  The left ventricular ejection fraction is 50-55% by visual estimate.  There is no mitral valve regurgitation.  1. Non-obstructive diffuse disease in the LAD 2. Severe stenosis very small caliber intermediate branch. Unchanged from last cath and too small for PCI 3. Severe stenosis OM1 just beyond old stent. Successful PTCA/DES x 1 OM1. 4. Non-obstructive disease in the large dominant RCA 5. Normal LV systolic function.  Recommendations: Continue ASA, Plavix and statin. Same day discharge if stable.  Findings Coronary Findings Diagnostic  Dominance: Right  Left Anterior Descending Ost LAD to Mid LAD lesion is 30% stenosed. Dist LAD lesion is 50% stenosed.  First Diagonal Branch Vessel is small in size. Ost 1st Diag lesion is 50% stenosed. 1st Diag lesion is 50% stenosed.  Left Circumflex Ost Cx to Prox Cx lesion is 30% stenosed. Prox Cx lesion is 10% stenosed. The lesion was previously treated using a drug eluting stent between 1-2 years ago. Prox Cx to Mid Cx lesion is 40% stenosed.  First Obtuse Marginal Branch Vessel is small in size. Ost 1st Mrg lesion is 90% stenosed.  Second Obtuse Marginal Branch Vessel is moderate in size. Ost 2nd Mrg-1 lesion is 10% stenosed. The lesion was previously treated using a drug eluting stent between 1-2 years ago. Ost 2nd Mrg-2 lesion is 80% stenosed. 2nd Mrg lesion is 50% stenosed.  Right Coronary Artery Vessel is large. Mid RCA lesion is 20% stenosed.  Right Posterior Atrioventricular Artery Post Atrio lesion is 50% stenosed.  Intervention  Ost 2nd Mrg-1 lesion Stent (Also treats lesions: Ost 2nd Mrg-2) Lesion crossed with guidewire. Pre-stent angioplasty was performed using a BALLOON EMERGE MR 2.0X8.  A drug-eluting stent was successfully placed using a STENT SYNERGY DES 2.25X12. Stent overlaps previously placed stent. Post-stent angioplasty was performed using a BALLOON [place] EMERGE MR 2.25X6. Post-Intervention Lesion Assessment There is a 0% residual stenosis post intervention.  Ost 2nd Mrg-2 lesion Stent (Also treats lesions: Ost 2nd Mrg-1) See details in [place] 2nd Mrg-1 lesion. Post-Intervention Lesion Assessment There is a 0% residual stenosis post intervention.   STRESS TESTS  MYOCARDIAL PERFUSION IMAGING 07/21/2020  Narrative  The left ventricular ejection fraction is normal (55-65%).  Nuclear stress EF: 57%.  Blood pressure demonstrated a hypertensive response to exercise.  This is a low risk study.  Exercise time: 9:20 min Workload: 10.7 METS, above average exercise capacity Test stopped due to: THR met BP response: hypertensive 236/83 mmHg with peak stress HR response: normal Rhythm: SR. ECG: horizontal ST segment depression of 3 mm was noted during stress in the II, III, aVF, V4, V5 and V6 leads, beginning at 7 minutes of stress, and returning to baseline after 5-9 minutes of recovery.  No ischemia or infarction on perfusion images. Normal wall motion.   ECHOCARDIOGRAM  ECHOCARDIOGRAM COMPLETE 07/21/2022  Narrative ECHOCARDIOGRAM REPORT    Patient Name:   KHALFANI COBER Date of Exam: 07/21/2022 Medical Rec #:  [id number]    Height:       69.0 in Accession #:    [id number]   Weight:       168.0 lb Date of Birth:  12-03-1950   BSA:          1.918 m Patient Age:    71 years     BP:  130/74 mmHg Patient Gender: M            HR:           58 bpm. Exam Location:  Inpatient  Procedure: 2D Echo, Cardiac Doppler and Color Doppler  Indications:    Acute Myocardial Infarction  History:        Patient has prior history of Echocardiogram examinations, most recent 06/12/2020. Acute MI and CAD, Signs/Symptoms:Dyspnea; Risk Factors:Hypertension, Former Smoker and  Diabetes.  Sonographer:    Lucy Antigua Referring Phys: [id number] Orbie Pyo   Sonographer Comments: Suboptimal parasternal window. IMPRESSIONS   1. Limited study and Definity contrast not used so cannot assess wall motion. Recommend repeating limited study with contrast to better evaluate. 2. Left ventricular ejection fraction, by estimation, is 55 to 60% although LV endocardium not well visualized. As above, recommend repeating limited study with definity to better assess. Left ventricular endocardial border not optimally defined to evaluate regional wall motion. Left ventricular diastolic parameters are consistent with Grade I diastolic dysfunction (impaired relaxation). 3. Right ventricular systolic function is normal. The right ventricular size is normal. 4. The mitral valve is normal in structure. Trivial mitral valve regurgitation. 5. The aortic valve was not well visualized. Aortic valve regurgitation is not visualized. Aortic valve sclerosis/calcification is present, without any evidence of aortic stenosis. 6. The inferior vena cava is normal in size with greater than 50% respiratory variability, suggesting right atrial pressure of 3 mmHg.  FINDINGS Left Ventricle: Left ventricular ejection fraction, by estimation, is 55 to 60%. The left ventricle has normal function. Left ventricular endocardial border not optimally defined to evaluate regional wall motion. The left ventricular internal cavity size was normal in size. There is no left ventricular hypertrophy. Left ventricular diastolic parameters are consistent with Grade I diastolic dysfunction (impaired relaxation).  Right Ventricle: The right ventricular size is normal. No increase in right ventricular wall thickness. Right ventricular systolic function is normal.  Left Atrium: Left atrial size was normal in size.  Right Atrium: Right atrial size was normal in size.  Pericardium: There is no evidence of pericardial  effusion.  Mitral Valve: The mitral valve is normal in structure. Trivial mitral valve regurgitation.  Tricuspid Valve: The tricuspid valve is normal in structure. Tricuspid valve regurgitation is not demonstrated.  Aortic Valve: The aortic valve was not well visualized. Aortic valve regurgitation is not visualized. Aortic valve sclerosis/calcification is present, without any evidence of aortic stenosis. Aortic valve mean gradient measures 7.0 mmHg. Aortic valve peak gradient measures 12.7 mmHg. Aortic valve area, by VTI measures 1.67 cm.  Aorta: The aortic root is normal in size and structure.  Venous: The inferior vena cava is normal in size with greater than 50% respiratory variability, suggesting right atrial pressure of 3 mmHg.  IAS/Shunts: The atrial septum is grossly normal.   LEFT VENTRICLE PLAX 2D LVIDd:         4.10 cm     Diastology LVIDs:         3.30 cm     LV e' medial:    3.26 cm/s LV PW:         1.00 cm     LV E/e' medial:  18.3 LV IVS:        1.00 cm     LV e' lateral:   5.55 cm/s LVOT diam:     2.00 cm     LV E/e' lateral: 10.7 LV SV:         63  LV SV Index:   33 LVOT Area:     3.14 cm  LV Volumes (MOD) LV vol d, MOD A4C: 56.4 ml LV vol s, MOD A4C: 18.8 ml LV SV MOD A4C:     56.4 ml  RIGHT VENTRICLE RV S prime:     13.70 cm/s TAPSE (M-mode): 2.1 cm  LEFT ATRIUM             Index        RIGHT ATRIUM           Index LA Vol (A2C):   47.0 ml 24.50 ml/m  RA Area:     11.60 cm LA Vol (A4C):   32.1 ml 16.73 ml/m  RA Volume:   24.20 ml  12.61 ml/m LA Biplane Vol: 39.7 ml 20.69 ml/m AORTIC VALVE AV Area (Vmax):    1.51 cm AV Area (Vmean):   1.35 cm AV Area (VTI):     1.67 cm AV Vmax:           178.00 cm/s AV Vmean:          122.000 cm/s AV VTI:            0.378 m AV Peak Grad:      12.7 mmHg AV Mean Grad:      7.0 mmHg LVOT Vmax:         85.50 cm/s LVOT Vmean:        52.500 cm/s LVOT VTI:          0.201 m LVOT/AV VTI ratio: 0.53  AORTA Ao Root  diam: 2.80 cm Ao Asc diam:  3.10 cm  MITRAL VALVE MV Area (PHT): 2.61 cm    SHUNTS MV Decel Time: 291 msec    Systemic VTI:  0.20 m MV E velocity: 59.60 cm/s  Systemic Diam: 2.00 cm MV A velocity: 93.80 cm/s MV E/A ratio:  0.64  Laurance Flatten MD Electronically signed by Laurance Flatten MD Signature Date/Time: 07/21/2022/5:28:01 PM    Final              EKG:  EKG is not ordered today.    Recent Labs: 07/21/2022: ALT 28 07/22/2022: BUN 22; Creatinine, Ser 1.67; Hemoglobin 14.7; Platelets 204; Potassium 4.4; Sodium 138  Recent Lipid Panel    Component Value Date/Time   CHOL 142 07/21/2022 0501   CHOL 122 10/02/2018 0845   TRIG 89 07/21/2022 0501   HDL 37 (L) 07/21/2022 0501   HDL 35 (L) 10/02/2018 0845   CHOLHDL 3.8 07/21/2022 0501   VLDL 18 07/21/2022 0501   LDLCALC 87 07/21/2022 0501   LDLCALC 69 10/02/2018 0845    Home Medications   Current Meds  Medication Sig   acetaminophen (TYLENOL) 325 MG tablet Take 650 mg by mouth every morning.   aspirin 81 MG chewable tablet Chew 1 tablet (81 mg total) by mouth daily.   empagliflozin (JARDIANCE) 25 MG TABS tablet Take 25 mg by mouth daily.   escitalopram (LEXAPRO) 10 MG tablet Take 10 mg by mouth daily.   Multiple Vitamin (MULTIVITAMIN WITH MINERALS) TABS tablet Take 1 tablet by mouth daily.   nitroGLYCERIN (NITROSTAT) 0.4 MG SL tablet Place 1 tablet (0.4 mg total) under the tongue every 5 (five) minutes x 3 doses as needed for chest pain.   pantoprazole (PROTONIX) 40 MG tablet Take 1 tablet (40 mg total) by mouth 2 (two) times daily.   rosuvastatin (CRESTOR) 40 MG tablet Take 1 tablet (40 mg total) by mouth daily.  Semaglutide,0.25 or 0.5MG /DOS, (OZEMPIC, 0.25 OR 0.5 MG/DOSE,) 2 MG/3ML SOPN 0.25mg  weekly for 4 weeks then 0.5mg  weekly after that   Study - EVOLVE-MI - evolocumab (REPATHA) 140 mg/mL SQ injection (PI-[place]) Inject 1 mL (140 mg total) into the skin every 14 (fourteen) days. For Investigational Use Only.  Inject subcutaneously into abdomen, thigh, or upper arm. Rotate injection sites and do not inject into areas where skin is tender, bruised, or red. please contact [facility] ([phone]) for any questions or concerns regarding the medication   ticagrelor (BRILINTA) 90 MG TABS tablet Take 1 tablet (90 mg total) by mouth 2 (two) times daily.   valsartan (DIOVAN) 80 MG tablet 1 tablet     Review of Systems      All other systems reviewed and are otherwise negative except as noted above.  Physical Exam    VS:  BP (!) 124/54   Pulse (!) 55   Ht 5\' 9"  (1.753 m)   Wt 161 lb (73 kg)   SpO2 98%   BMI 23.78 kg/m  , BMI Body mass index is 23.78 kg/m.  Wt Readings from Last 3 Encounters:  11/01/22 161 lb (73 kg)  08/02/22 159 lb 9.6 oz (72.4 kg)  07/21/22 168 lb (76.2 kg)     GEN: Well nourished, well developed, in no acute distress. HEENT: normal. Neck: Supple, no JVD, right sided carotid bruit, no masses. Cardiac: RRR, no murmurs, rubs, or gallops. No clubbing, cyanosis, edema.  Radials/PT 2+ and equal bilaterally.  Respiratory:  Respirations regular and unlabored, clear to auscultation bilaterally. GI: Soft, nontender, nondistended. MS: No deformity or atrophy. Skin: Warm and dry, no rash.  Neuro:  Strength and sensation are intact. Psych: Normal affect.  Assessment & Plan    Inferior STEMI status post PCI -No chest pain -Continue current medication regimen which includes aspirin 81 mg daily, Brilinta 90 mg twice a day, Crestor 40 mg daily, nitro as needed, Jardiance 25 mg daily -hold valsartan due to hypotension -Encourage participation in cardiac rehab -Continue low-sodium, heart healthy diet -Continue to increase exercise as tolerated  Hypertension -Blood pressure well-controlled today 124/54 -Continue current medication regimen -Continue to monitor blood pressure at home an hour after morning medication  Hyperlipidemia -LDL 87 (07/21/2022) -Goal is <70 -lipid panel due at next  visit -Continue Crestor 40 mg daily  Diabetes mellitus -A1c 7.2 -Per primary care  5. Right carotid bruit -bilateral US of the carotids      Disposition: Follow up 6 months with Donato Schultz, MD or APP.  Signed, Sharlene Dory, PA-C 11/01/2022, 4:30 PM [facility]

## 2022-11-06 ENCOUNTER — Ambulatory Visit (HOSPITAL_COMMUNITY)
Admission: RE | Admit: 2022-11-06 | Discharge: 2022-11-06 | Disposition: A | Discharge status: Home / Self Care | Payer: Medicare Other | Source: Ambulatory Visit | Attending: Physician Assistant | Admitting: Physician Assistant

## 2022-11-06 DIAGNOSIS — R0989 Other specified symptoms and signs involving the circulatory and respiratory systems: Secondary | ICD-10-CM

## 2022-11-22 ENCOUNTER — Telehealth: Payer: Self-pay | Admitting: *Deleted

## 2022-11-22 NOTE — Telephone Encounter (Signed)
Called patient for his 45 weeek phone call for the EVOLVE Study left message for him to call back   [place] Roselie Cirigliano., Research Coodinator

## 2022-12-26 ENCOUNTER — Other Ambulatory Visit: Payer: Self-pay

## 2022-12-28 ENCOUNTER — Encounter (HOSPITAL_COMMUNITY): Payer: Self-pay

## 2022-12-30 ENCOUNTER — Telehealth: Payer: Self-pay | Admitting: *Deleted

## 2022-12-30 NOTE — Telephone Encounter (Signed)
Called patient for phone call follow up  left message for patient to call back

## 2022-12-30 NOTE — Research (Signed)
12 week Follow-Up Visit Completed* MEDICAL RECORDS ONLY PATIENT DID NOT RETURN CALLS   []  Not Necessary, No Potential Adverse Events Or Medication Issues Reported On Completed Subject Questionnaire   []  Yes, Contact With Subject/Alternate Contact Completed   [x]  Yes, No Contact With Subject/Alternate Contact Completed, But Electronic Health Record Was Reviewed   []  No, Unable To Contact Subject/Alternate Contact   Have you reviewed Ongoing medications on the Targeted Concomitant Medication form and updated the form as needed?   [x]  Yes   []  No   Subject Status*   [x]  Continuing In Follow-up   []  At Risk For Lost To Follow-up   []  Withdrawal From All Future Study Activities Including Passive Follow-up By Electronic Health Record Review Or Contact With Healthcare Provider Or Family Member/Friend   []  Death   Vital Status*   [x]  Alive   []  Deceased   []  Unknown   Last Known To Be Alive Source*   [x]  Subject Completed Follow-up Questionnaire/Seen In Person/Via Telephone Contact   []  Family Member or Caretaker   [x]  Primary Physician Or Medical Records   []  Publicly Available Source   []  Other  Date of last dose taken   DAY/MONTH/YEAR  Over the last 12 weeks did the subject miss any doses?  Over the last 12 weeks did the subject restart Evolocumab after an interruption?

## 2023-01-08 ENCOUNTER — Other Ambulatory Visit: Payer: Self-pay

## 2023-01-13 ENCOUNTER — Encounter: Payer: Self-pay | Admitting: *Deleted

## 2023-01-13 DIAGNOSIS — Z006 Encounter for examination for normal comparison and control in clinical research program: Secondary | ICD-10-CM

## 2023-01-13 NOTE — Research (Signed)
24 Week Follow-Up Visit Completed*   []  Not Necessary, No Potential Adverse Events Or Medication Issues Reported On Completed Subject Questionnaire   [x]  Yes, Contact With Subject/Alternate Contact Completed   []  Yes, No Contact With Subject/Alternate Contact Completed, But Electronic Health Record Was Reviewed   []  No, Unable To Contact Subject/Alternate Contact   Have you reviewed Ongoing medications on the Targeted Concomitant Medication form and updated the form as needed?   [x]  Yes   []  No   Subject Status*   [x]  Continuing In Follow-up   []  At Risk For Lost To Follow-up   []  Withdrawal From All Future Study Activities Including Passive Follow-up By Electronic Health Record Review Or Contact With Healthcare Provider Or Family Member/Friend   []  Death   Vital Status*   [x]  Alive   []  Deceased   []  Unknown   Last Known To Be Alive Source*   [x]  Subject Completed Follow-up Questionnaire/Seen In Person/Via Telephone Contact   []  Family Member or Caretaker   []  Primary Physician Or Medical Records   []  Publicly Available Source   []  Other  Date of last dose taken   13-Jan-2023  Over the last 12 weeks did the subject miss any doses?n/a  Over the last 12 weeks did the subject restart Evolocumab after an interruption?n/a

## 2023-01-20 ENCOUNTER — Other Ambulatory Visit (HOSPITAL_COMMUNITY): Payer: Self-pay | Admitting: Cardiology

## 2023-01-20 ENCOUNTER — Other Ambulatory Visit (HOSPITAL_COMMUNITY): Payer: Self-pay

## 2023-01-20 ENCOUNTER — Other Ambulatory Visit: Payer: Self-pay

## 2023-01-20 MED ORDER — REPATHA SURECLICK 140 MG/ML ~~LOC~~ SOAJ
140.0000 mg | SUBCUTANEOUS | 2 refills | Status: DC
  Filled 2023-01-20 – 2023-01-23 (×2): qty 12, 168d supply, fill #0

## 2023-01-23 ENCOUNTER — Other Ambulatory Visit: Payer: Self-pay

## 2023-01-23 ENCOUNTER — Other Ambulatory Visit (HOSPITAL_COMMUNITY): Payer: Self-pay

## 2023-01-23 NOTE — Progress Notes (Signed)
Specialty Pharmacy Initial Fill Coordination Note  Brian Hudson is a 72 y.o. male contacted today regarding refills of specialty medication(s) Evolocumab   Patient requested [name] at [facility] [place] date: 01/24/23   Medication will be filled on 10/17.   Patient is aware of $0 copayment.

## 2023-01-28 ENCOUNTER — Other Ambulatory Visit: Payer: Self-pay

## 2023-01-28 ENCOUNTER — Other Ambulatory Visit (HOSPITAL_COMMUNITY): Payer: Self-pay

## 2023-01-30 DIAGNOSIS — E1122 Type 2 diabetes mellitus with diabetic chronic kidney disease: Secondary | ICD-10-CM | POA: Diagnosis not present

## 2023-01-30 DIAGNOSIS — I129 Hypertensive chronic kidney disease with stage 1 through stage 4 chronic kidney disease, or unspecified chronic kidney disease: Secondary | ICD-10-CM | POA: Diagnosis not present

## 2023-01-30 DIAGNOSIS — N1831 Chronic kidney disease, stage 3a: Secondary | ICD-10-CM | POA: Diagnosis not present

## 2023-01-30 DIAGNOSIS — D631 Anemia in chronic kidney disease: Secondary | ICD-10-CM | POA: Diagnosis not present

## 2023-01-30 DIAGNOSIS — E210 Primary hyperparathyroidism: Secondary | ICD-10-CM | POA: Diagnosis not present

## 2023-01-30 DIAGNOSIS — N189 Chronic kidney disease, unspecified: Secondary | ICD-10-CM | POA: Diagnosis not present

## 2023-02-17 ENCOUNTER — Other Ambulatory Visit (HOSPITAL_COMMUNITY): Payer: Self-pay

## 2023-02-17 DIAGNOSIS — R1114 Bilious vomiting: Secondary | ICD-10-CM | POA: Diagnosis not present

## 2023-04-12 ENCOUNTER — Other Ambulatory Visit: Payer: Self-pay | Admitting: Cardiology

## 2023-04-18 ENCOUNTER — Encounter: Payer: Self-pay | Admitting: *Deleted

## 2023-04-18 DIAGNOSIS — Z006 Encounter for examination for normal comparison and control in clinical research program: Secondary | ICD-10-CM

## 2023-04-18 NOTE — Research (Signed)
 36-WEEK Follow-Up Visit Completed*   []  Not Necessary, No Potential Adverse Events Or Medication Issues Reported On Completed Subject Questionnaire   [x]  Yes, Contact With Subject/Alternate Contact Completed   []  Yes, No Contact With Subject/Alternate Contact Completed, But Electronic Health Record Was Reviewed   []  No, Unable To Contact Subject/Alternate Contact   Have you reviewed Ongoing medications on the Targeted Concomitant Medication form and updated the form as needed?   [x]  Yes   []  No   Subject Status*   [x]  Continuing In Follow-up   []  At Risk For Lost To Follow-up   []  Withdrawal From All Future Study Activities Including Passive Follow-up By Electronic Health Record Review Or Contact With Healthcare Provider Or Family Member/Friend   []  Death   Vital Status*   [x]  Alive   []  Deceased   []  Unknown   Last Known To Be Alive Source*   [x]  Subject Completed Follow-up Questionnaire/Seen In Person/Via Telephone Contact   []  Family Member or Caretaker   []  Primary Physician Or Medical Records   []  Publicly Available Source   []  Other  Date of last dose taken   16-April-2023  Over the last 12 weeks did the subject miss any doses? NO  Over the last 12 weeks did the subject restart Evolocumab  after an interruption? NO

## 2023-05-05 DIAGNOSIS — E785 Hyperlipidemia, unspecified: Secondary | ICD-10-CM | POA: Diagnosis not present

## 2023-05-05 DIAGNOSIS — Z8546 Personal history of malignant neoplasm of prostate: Secondary | ICD-10-CM | POA: Diagnosis not present

## 2023-05-05 DIAGNOSIS — E1165 Type 2 diabetes mellitus with hyperglycemia: Secondary | ICD-10-CM | POA: Diagnosis not present

## 2023-05-05 DIAGNOSIS — N1831 Chronic kidney disease, stage 3a: Secondary | ICD-10-CM | POA: Diagnosis not present

## 2023-05-05 DIAGNOSIS — I10 Essential (primary) hypertension: Secondary | ICD-10-CM | POA: Diagnosis not present

## 2023-05-05 DIAGNOSIS — Z23 Encounter for immunization: Secondary | ICD-10-CM | POA: Diagnosis not present

## 2023-05-05 DIAGNOSIS — Z79899 Other long term (current) drug therapy: Secondary | ICD-10-CM | POA: Diagnosis not present

## 2023-05-05 DIAGNOSIS — Z1331 Encounter for screening for depression: Secondary | ICD-10-CM | POA: Diagnosis not present

## 2023-05-05 DIAGNOSIS — F322 Major depressive disorder, single episode, severe without psychotic features: Secondary | ICD-10-CM | POA: Diagnosis not present

## 2023-05-05 DIAGNOSIS — Z0000 Encounter for general adult medical examination without abnormal findings: Secondary | ICD-10-CM | POA: Diagnosis not present

## 2023-05-05 DIAGNOSIS — K909 Intestinal malabsorption, unspecified: Secondary | ICD-10-CM | POA: Diagnosis not present

## 2023-05-07 ENCOUNTER — Encounter: Payer: Self-pay | Admitting: Cardiology

## 2023-05-07 ENCOUNTER — Ambulatory Visit: Payer: Medicare Other | Attending: Cardiology | Admitting: Cardiology

## 2023-05-07 VITALS — BP 130/86 | HR 63 | Ht 68.0 in | Wt 160.2 lb

## 2023-05-07 DIAGNOSIS — I10 Essential (primary) hypertension: Secondary | ICD-10-CM | POA: Diagnosis not present

## 2023-05-07 DIAGNOSIS — E782 Mixed hyperlipidemia: Secondary | ICD-10-CM | POA: Diagnosis not present

## 2023-05-07 DIAGNOSIS — I2510 Atherosclerotic heart disease of native coronary artery without angina pectoris: Secondary | ICD-10-CM | POA: Diagnosis not present

## 2023-05-07 NOTE — Patient Instructions (Signed)
Medication Instructions:  Your physician recommends that you continue on your current medications as directed. Please refer to the Current Medication list given to you today.  *If you need a refill on your cardiac medications before your next appointment, please call your pharmacy*  Lab Work: None ordered.  If you have labs (blood work) drawn today and your tests are completely normal, you will receive your results only by: MyChart Message (if you have MyChart) OR A paper copy in the mail If you have any lab test that is abnormal or we need to change your treatment, we will call you to review the results.  Testing/Procedures: None ordered.  Follow-Up: At [facility], you and your health needs are our priority.  As part of our continuing mission to provide you with exceptional heart care, we have created designated Provider Care Teams.  These Care Teams include your primary Cardiologist (physician) and Advanced Practice Providers (APPs -  Physician Assistants and Nurse Practitioners) who all work together to provide you with the care you need, when you need it.   Your next appointment:   1 year(s)  The format for your next appointment:   In Person  Provider:   Donato Schultz, MD{   Important Information About Sugar

## 2023-05-08 NOTE — Progress Notes (Signed)
Cardiology Office Note:  .   Date:  05/08/2023  ID:  Benedict Needy, DOB 03/20/1951, MRN [id number] PCP: Emilio Aspen, MD  [facility] Providers Cardiologist:  Donato Schultz, MD    History of Present Illness: .   RAESHAWN VO is a 73 y.o. male Discussed with the use of AI scribe   History of Present Illness   The patient is a 73 year old male with coronary artery disease who presents for follow-up after an inferior STEMI. He is accompanied by his wife.  He has no current chest pain and feels good overall, with no recent heart-related issues. His last echocardiogram on July 21, 2022, showed an ejection fraction of 60% with grade one diastolic dysfunction. A nuclear stress test on July 21, 2020, showed no ischemia, and a cardiac catheterization on May 23, 2017, resulted in PCI to the OM1.  He has hyperlipidemia and was previously on Repatha, which he stopped due to cost and inconvenience. He is currently on rosuvastatin 40 mg and aspirin 81 mg. His LDL cholesterol is currently 32, which is above the target goal.  He has a history of hypertension and was previously on valsartan but discontinued it due to experiencing lightheadedness and staggering. He feels better off the medication. His blood pressure was noted to be low during a previous visit, which was a concern.  He has diabetes and is taking Jardiance 25 mg. He also mentioned taking Ozempic, which he refers to as a 'sugar shot'. He reports having lost weight but has gained some back, currently weighing around 160 pounds, down from 155 pounds.  He lives alone and stays active by helping a friend with house trailer repairs. He emphasizes the importance of staying active and engaged with others.  No chest pain and no significant heart-related issues recently. He experiences some breathlessness but attributes it to talking a lot. No current troubles or symptoms related to his heart.          Studies Reviewed: .         Results   LABS LDL cholesterol: 87 mg/dL Creat 6.21 Prior trop 3086 in 07/2022  DIAGNOSTIC Echocardiogram: EF 60%, Grade 1 diastolic dysfunction (07/21/2022) Nuclear stress test: No ischemia (07/21/2020) Cardiac catheterization: PCI to OM1 (05/23/2017)     Risk Assessment/Calculations:            Physical Exam:   VS:  BP 130/86   Pulse 63   Ht 5\' 8"  (1.727 m)   Wt 160 lb 3.2 oz (72.7 kg)   SpO2 98%   BMI 24.36 kg/m    Wt Readings from Last 3 Encounters:  05/07/23 160 lb 3.2 oz (72.7 kg)  11/01/22 161 lb (73 kg)  08/02/22 159 lb 9.6 oz (72.4 kg)    GEN: Well nourished, well developed in no acute distress NECK: No JVD; No carotid bruits CARDIAC: RRR, no murmurs, no rubs, no gallops RESPIRATORY:  Clear to auscultation without rales, wheezing or rhonchi  ABDOMEN: Soft, non-tender, non-distended EXTREMITIES:  No edema; No deformity   ASSESSMENT AND PLAN: .    Assessment and Plan    Coronary Artery Disease with Inferior STEMI Follow-up for coronary artery disease status post inferior STEMI. Reports no chest pain or other cardiac symptoms. Echocardiogram on 07/21/2022 shows EF of 60% with grade one diastolic dysfunction. Nuclear stress test on 07/21/2020 showed no ischemia. Cardiac catheterization on 05/23/2017 with PCI to OM1. LDL cholesterol is 87, above the goal of <70, ideally <55  due to history of myocardial infarction. Previously on Repatha but discontinued due to cost and inconvenience. Discussed restarting Repatha to lower LDL to <55, emphasizing the benefit of reducing future cardiac events. Patient agreed to restart Repatha. - Restart Repatha - Continue rosuvastatin 40 mg - Continue aspirin 81 mg - Follow-up in one year to recheck cholesterol levels  Hypertension Reports discontinuation of valsartan due to lightheadedness and hypotension. Blood pressure was previously too low on valsartan. Decision to avoid valsartan unless necessary based on nephrologist's  recommendation. - Monitor blood pressure - Avoid valsartan unless necessary  Diabetes Mellitus On Jardiance 25 mg and Ozempic for diabetes management. No specific issues discussed. Patient reports feeling well. - Continue Jardiance 25 mg - Continue Ozempic  Right Carotid Bruit Presence of right carotid bruit noted. No further details or changes discussed. - Monitor for symptoms or changes  Follow-up - Follow-up in one year.              Signed, Donato Schultz, MD

## 2023-05-16 ENCOUNTER — Ambulatory Visit: Payer: Medicare Other | Admitting: Internal Medicine

## 2023-05-20 DIAGNOSIS — R748 Abnormal levels of other serum enzymes: Secondary | ICD-10-CM | POA: Diagnosis not present

## 2023-05-25 ENCOUNTER — Other Ambulatory Visit: Payer: Self-pay | Admitting: Cardiology

## 2023-05-27 ENCOUNTER — Telehealth: Payer: Self-pay | Admitting: Cardiology

## 2023-05-27 MED ORDER — TICAGRELOR 90 MG PO TABS: 90.0000 mg | ORAL_TABLET | Freq: Two times a day (BID) | ORAL | 3 refills | Status: DC

## 2023-05-27 NOTE — Telephone Encounter (Signed)
Pt c/o medication issue:  1. Name of Medication:   ticagrelor (BRILINTA) 90 MG TABS tablet    2. How are you currently taking this medication (dosage and times per day)? As written   3. Are you having a reaction (difficulty breathing--STAT)? No   4. What is your medication issue? Sister wants to discuss if patient can get patient assistance for some of his medications

## 2023-05-27 NOTE — Telephone Encounter (Signed)
Pt's medication was already sent to pt's preferred pharmacy as requested. Confirmation received.

## 2023-05-27 NOTE — Telephone Encounter (Signed)
*  STAT* If patient is at the pharmacy, call can be transferred to refill team.   1. Which medications need to be refilled? (please list name of each medication and dose if known)   ticagrelor (BRILINTA) 90 MG TABS tablet    2. Which pharmacy/location (including street and city if local pharmacy) is medication to be sent to? [id number] - [place], [place] - [address] Phone: [phone]  Fax: [phone]    [facility] [place]    3. Do they need a 30 day or 90 day supply? 30

## 2023-05-29 ENCOUNTER — Telehealth: Payer: Self-pay

## 2023-05-29 ENCOUNTER — Other Ambulatory Visit (HOSPITAL_COMMUNITY): Payer: Self-pay

## 2023-05-29 NOTE — Telephone Encounter (Signed)
Hi, yes. I spoke to the sister about the PAP application. Mailing their portion to her next week once we are back onsite.

## 2023-05-29 NOTE — Telephone Encounter (Signed)
Spoke to sister. Will mail out PAP application to her address next week.

## 2023-05-30 NOTE — Telephone Encounter (Signed)
App mailed to sister's address.

## 2023-06-13 ENCOUNTER — Encounter: Payer: Self-pay | Admitting: Internal Medicine

## 2023-06-13 ENCOUNTER — Ambulatory Visit: Payer: Medicare Other | Admitting: Internal Medicine

## 2023-06-13 VITALS — BP 118/72 | HR 54 | Ht 68.0 in | Wt 165.0 lb

## 2023-06-13 DIAGNOSIS — I7782 Antineutrophilic cytoplasmic antibody (ANCA) vasculitis: Secondary | ICD-10-CM

## 2023-06-13 DIAGNOSIS — N189 Chronic kidney disease, unspecified: Secondary | ICD-10-CM

## 2023-06-13 DIAGNOSIS — Z862 Personal history of diseases of the blood and blood-forming organs and certain disorders involving the immune mechanism: Secondary | ICD-10-CM

## 2023-06-13 DIAGNOSIS — R0609 Other forms of dyspnea: Secondary | ICD-10-CM

## 2023-06-13 DIAGNOSIS — R911 Solitary pulmonary nodule: Secondary | ICD-10-CM | POA: Diagnosis not present

## 2023-06-13 DIAGNOSIS — Z87891 Personal history of nicotine dependence: Secondary | ICD-10-CM

## 2023-06-13 NOTE — Patient Instructions (Addendum)
 History of Wegener's granulomatosis  CKD  - in 2022 ANCA antibodies were increased and in April 2024 kidney function getting worse  Plan  - ANCA check - SEd rate check  - Quantiferon gold check - CBC and BMET check   History of anemia 2023  - resolved April 2024  Plan  - check CBC 06/13/2023  Shortness of breath Mild air trapping on  prior CT  - -Shortness of breath mostl likely due to stiff heart muscle aka diastolic dysfunction but currently no ttaking arnuity  - improved after MI April 2024 and now working part time  PLAN - do PFT in 8 weeks  1.9 cm GGO Nodule of left lung - last seen dec 2019 -> stable/sligh increased  march 2022  Former Smoker   Plan  CT chest wo contrast next few weeks -> then followup APP  Follouwp 8 weeks app to discuss results

## 2023-06-13 NOTE — Progress Notes (Signed)
 Subjective:     Patient ID: Brian Hudson, male   DOB: November 06, 1950, 73 y.o.   MRN: [id number]  HPI PCP Marden Noble, MD   IOV 08/21/2016  Chief Complaint  Patient presents with   Pulm Consult    Referred by Azucena Fallen PA-C for vasculitis.    72 year old male referred by Dr. Zenovia Jordan and of physician assistant for evaluation of shortness of breath in the setting of previous diagnosis of Wegner's. There is very little outside information but in terms of his Wegener's granulomatosis he tells me that he was diagnosed with this in 2004 following significant sinus symptoms including bloody sinus drainage. This was at [facility]. He was treated for approximately one year with Cytoxan and subsequently on maintenance methotrexate. His last treatment for Wegener's was in 2008. Since then he's been on observation therapy. Treatment Back then  included prednisone as well. Then approximately 2 or 3 years ago he switched care to Dr. Zenovia Jordan and rheumatology. According to him his been on observation therapy there as well. He tells me now for the last year or 2 he's been more fatigue associated with shortness of breath. He used to walk 2 miles a day but now is unable to do that. Dyspnea is present all the time but definitely more so with exertion and relieved by rest. There is associated cough and sometimes hemoptysis especially early in the morning but this is been ongoing for several years. He thinks his Wegener's might be active. He believes Dr. Zenovia Jordan is done some blood work to look for vasculitis activity but I do not have those results with me. A CT scan of the chest was done that showed left upper lobe groundglass opacity and therefore he is been here. He denies any chest pain     FeNO 34ppb 08/21/2016 and slightly high  Walking desaturation test on 08/21/2016 185 feet x 3 laps on RA:  did NOT desaturate. Rest pulse ox was 100%, final pulse ox was 90. HR response  50/min at rest to 62/min at peak exertion.    IMPRESSION: CT scan of the chest personally visualized 1. No acute abnormality is seen on CT of the chest. 2. Coronary artery calcifications. 3. Minimal ground-glass opacity in the left upper lobe of doubtful significance. Initial follow-up with CT at 6-12 months is recommended to confirm persistence. If persistent, repeat CT is recommended every 2 years until 5 years of stability has been established. This recommendation follows the consensus statement: Guidelines for Management of Incidental Pulmonary Nodules Detected on CT Images: From the Fleischner Society 2017; Radiology 2017; 284:228-243.     Electronically Signed   By: Dwyane Dee M.D.   On: 06/11/2016 17:14    09/11/2016 PFT' Follow Up: Brian Hudson is a 73 y.o. male former smoker quit in 1994 with history of Wegner's Disease since 2004, and dyspnea on exertion.  Pt. Was seen by Dr. Marchelle Gearing 08/21/2016 for  dyspnea .Plan at that time was as follows: PLAN  - start symbicort 2 puff twice daily - take sample worth 1 month  - do ono test room air next few to several days  - refer cardiology  - Dr Jacinto Halim or [facility] first available - sign record releast to get more info on wegner from Dr Nickola Major esp blood work  - do full PFT  Patient presents today for follow-up. Patient has been compliant with Symbicort since 08/21/2016. He states he cannot tell any  significant difference. In his dyspnea. He states he continues to get short of breath at times with exercise and also when talking. He states his shortness of breath does not wake him from sleep. He continues to be unable to walk as he has done in the past. He was seen by Nada Boozer at [facility] on 09/06/2016. Of note there is a significant family history which includes coronary artery disease in his father, who had MI at the age of 57.Risk factors include CAD, DM, HLD, + FH premature CAD, hx tobacco use and coronary calcifications on CT  of chest.The patient is Scheduled for an exercise stress test 6/14 and an event monitor 6/14 to rule out tachy bradycardia syndrome. He states he does have secretions in the morning, yellowish with occasional blood tinged secretions, that self resolves..Lab work has not been sent from Dr. Nickola Major In [place], therefore I am unable to review. Patient denies any fever, chest pain, orthopnea, or hemoptysis.   Test Results:  CT angiogram: 06/11/2016 No acute abnormality is seen on CT of the chest. 2. Coronary artery calcifications. 3. Minimal ground-glass opacity in the left upper lobe of doubtful significance. Initial follow-up with CT at 6-12 months is recommended to confirm persistence  Ambulatory saturation: 08/21/2016 185 feet 3 laps on room air. Did not desaturate. Resting pulse ox was 100%. Final pulse ox was 90%. Heart rate response 50/m at rest is 62/m at peak exertion.  EKG 09/06/2016 Sinus bradycardia with poor R-wave progression, heart rate 44   OV 03/24/2017 Chief Complaint  Patient presents with   Follow-up    CT scan 03/14/17.  Pt states that he still has some tightness in chest, has occ coughing, and is SOB all the time.  Pt also states that he has gotten choked several times while trying to eat.   Remote Wegner's granulomatosis in 2004 status post therapy. Remote smoking history  Follow-up shortness of breath: eing seen in pulmonary clinic for shortness of breath. Earlier this year we referred him to cardiology on basis of coronary artery calcification and strong family history. He status post coronary artery stent 2. After that his cardiac rehabilitation. Dyspnea is only some better he still has residual dyspnea  Follow-up left upper lobe lung nodule is a groundglass opacity. He had follow-up CT scan of the chest without contrast 03/14/2017. This is documented below. It appears the ground glass opacity persists but it is also "close to 2 cm. However he says that his  [place] is under remission. He did see Dr. Nickola Major earlier this year and apparently blood work was normal.  New issue: Is complaining of nonspecific dysphagia 4 times in the last 2 weeks. It is for solids. He is pretty significant. No associated vomiting or aspiration. His sister confirms the same.  IMPRESSION: ct chest 03/14/17 Persistent 12 x 19 mm ground-glass opacity in the central left upper Lobe. Adenocarcinoma cannot be excluded. Follow up by CT is recommended in 12 months, with continued annual surveillance for a minimum of 3 Years. These recommendations are taken from: Recommendations for the Management of Subsolid Pulmonary Nodules Detected at CT: A Statement from the Fleischner Society Radiology 2013; 266:1, [id number].   Aortic Atherosclerosis (ICD10-I70.0).     Electronically Signed   By: Charline Bills M.D.   On: 03/14/2017 09:16   OV 04/29/2017  Chief Complaint  Patient presents with   Follow-up    PET scan done 04/11/17.  Pt has complaints of coughing (non-productive), shortness of breath  Follow-up shortness of breath in this patient with remote [place] history and remote smoking history Follow-up left lower lobe lung nodule  In terms of shortness of breath: He is compliant with his Symbicort for empiric asthma treatment.  He had coronary artery stents in the middle of 2018.  Despite this he is short of breath.  He says this frustrates him significantly.  He tells me that he is able to walk a block or climb steps without a problem or less of a problem.  And otherwise he does not been otherwise he does have some dyspnea on exertion for these activities.  But the significant amount of dyspnea happens when he is talking a lot.  He feels this is Wegener's dj vu again.  He did have Wegener's serology and did see Dr. Nickola Major in rheumatology end of 2018 and there is no evidence of Wegener's recurrence but he still feels this is white not measurable.  He has an appointment  upcoming with Dr. Donato Schultz his cardiologist.  In terms of left lower lobe lung nodule groundglass opacity: This was noted December 2018 CT chest.  He had a follow-up CT PET in January 2019 that showed this nodule was beginning to shrink.  His Wegner antibodies were negative/trace positive.  He has been reassured.  He requires a follow-up CT scan end of 2019.  New issue: Is complaining of chronic sinus discharge for months.  He feels is all Wegener's again.  I have repeatedly told him that so far we do not have evidence of recurrence.  OV 06/13/2017  Chief Complaint  Patient presents with   Follow-up    lung nodule, no SOB, no Wheezing, No chest tightness    Follow-up dyspnea: In terms of dyspnea he had a cardiopulmonary stress test that suggested coronary issues.  He then had a cardiac cath in February 2019 and the place a second coronary stent.  He tells me now that his dyspnea is better.  In fact when he talks he gets dyspnea and even this is better but he still has residual dyspnea.  He is trying to get into cardiac rehab and is waiting to hear from them.  There is no wheezing or cough  In terms of left lower lobe lung nodule groundglass opacity: He has follow-up scan pending in December 2019.  There are no other new issues.  OV 05/12/2020  Subjective:  Patient ID: Brian Hudson, male , DOB: 08-02-1950 , age 52 y.o. , MRN: [id number] , ADDRESS: [address] [place] [address] PCP Marden Noble, MD Patient Care Team: Marden Noble, MD as PCP - General (Internal Medicine) Jake Bathe, MD as PCP - Cardiology (Cardiology)  This Provider for this visit: Treatment Team:  Attending Provider: Kalman Shan, MD    05/12/2020 -   Chief Complaint  Patient presents with   Follow-up    SOB getting worse.    Follow-up history of Wegener's with left lower lobe nodule -Follow-up remote smoking Follow-up dyspnea on exertion -Has associated coronary artery  disease.   HPI Brian Hudson 73 y.o. -almost 3 years since I last saw him.  This is just under 3-year visit.  This is the further routine follow-up visit.  He tells me that in the last 3 years he had insidious onset of worsening shortness of breath.  This is accelerated in the last 6 months.  Talking makes it worse the worst is when he talks.  Although it is exertional when he  change his clothes and he climbs stairs.  He looks like of having a flat affect but it did not discuss any anxiety or depression in him.  He has not seen the cardiologist in a while.  He says he has upcoming appointment although I did not see it.  His cardiologist Dr. Donato Schultz.  He has a history of associated coronary artery disease and coronary artery stents but he denies any chest pain.  No orthopnea proximal nocturnal dyspnea or wheezing.  He does have a tickle in his throat when he lies down but otherwise no cough.  Last CT scan of the chest 2019 showing slight reduction in groundglass lesion in the left upper lobe.    CT Chest dat - dec 2019   IMPRESSION: 1. Stable to slightly smaller left upper lobe ground-glass lesion, measuring 9 x 18 mm today. Follow up by CT is recommended in 12 months, with continued annual surveillance for a minimum of 3 years. These recommendations are taken from: Recommendations for the Management of Subsolid Pulmonary Nodules Detected at CT: A Statement from the Fleischner Society Radiology 2013; 266:1, [phone]. 2.  Aortic Atherosclerois (ICD10-170.0)     Electronically Signed   By: Kennith Center M.D.   On: 03/11/2018 17:00     PFT  No flowsheet data found.      OV 06/28/2020  Subjective:  Patient ID: Brian Hudson, male , DOB: January 15, 1951 , age 10 y.o. , MRN: [id number] , ADDRESS: [address] [place] [address] PCP Marden Noble, MD Patient Care Team: Marden Noble, MD as PCP - General (Internal Medicine) Jake Bathe, MD as PCP - Cardiology  (Cardiology)  This Provider for this visit: Treatment Team:  Attending Provider: Kalman Shan, MD    06/28/2020 -   Chief Complaint  Patient presents with   Follow-up    PFT performed today.  States he has been doing okay since last visit and denies any real complaints.   Follow-up history of Wegener's with left lower lobe nodule -Follow-up remote smoking Follow-up dyspnea on exertion -Has associated coronary artery disease.   HPI Brian Hudson 73 y.o. -presents for follow-up of his worsening dyspnea.  This visit is to review test results.  He tells me that overall he is stable still has this dyspnea on exertion although this time he tells me it is present on and off.  Test results show the CT scan just shows mild air trapping his lung nodule is stable in 2-3 years but is worse over the last 4 years.  His pulmonary function test is normal his nitric oxide test is normal.  His echocardiogram shows grade 2 diastolic dysfunction.  He denies any hypertension obesity.  He has not seen his cardiologist Dr. Donato Schultz despite assurance at the last visit that he would see him.  He has never done pulmonary rehabilitation.  We discussed several care options and he has agreed to start with pulmonary rehabilitation and reestablish with cardiology.   FeNO   - 18ppb 06/28/2020   CT chest 06/07/20   IMPRESSION: 1. No evidence of interstitial lung disease. 2. Irregular ground-glass nodule in the left upper lobe, stable from 03/11/2018 but possibly slightly increased in prominence from baseline 06/11/2016. Follow-up CT chest without contrast in 2 years is recommended. This recommendation follows the consensus statement: Guidelines for Management of Small Pulmonary Nodules Detected on CT Images: From the Fleischner Society 2017; Radiology 2017; 284:228-243. 3. Mild air trapping is indicative of small  airways disease. 4. Aortic atherosclerosis (ICD10-I70.0). Coronary artery calcification.      Electronically Signed   By: Leanna Battles M.D.   On: 06/09/2020 09:25      ECHO march 2022   IMPRESSIONS     1. Left ventricular ejection fraction, by estimation, is 60 to 65%. The  left ventricle has normal function. The left ventricle has no regional  wall motion abnormalities. There is mild left ventricular hypertrophy.  Left ventricular diastolic parameters  are consistent with Grade II diastolic dysfunction (pseudonormalization).   2. Right ventricular systolic function is normal. The right ventricular  size is normal. There is normal pulmonary artery systolic pressure. The  estimated right ventricular systolic pressure is 31.7 mmHg.   3. Left atrial size was mildly dilated.   4. The mitral valve is normal in structure. Trivial mitral valve  regurgitation. No evidence of mitral stenosis.   5. The aortic valve is tricuspid. Aortic valve regurgitation is not  visualized. Mild aortic valve stenosis. Aortic valve area, by VTI measures  1.71 cm. Aortic valve mean gradient measures 10.0 mmHg.   6. The inferior vena cava is normal in size with greater than 50%  respiratory variability, suggesting right atrial pressure of 3 mmHg.    OV 01/18/2021  Subjective:  Patient ID: Brian Hudson, male , DOB: 02-Feb-1951 , age 34 y.o. , MRN: [id number] , ADDRESS: [address] [place] [address] PCP Marden Noble, MD Patient Care Team: Marden Noble, MD as PCP - General (Internal Medicine) Jake Bathe, MD as PCP - Cardiology (Cardiology)  This Provider for this visit: Treatment Team:  Attending Provider: Kalman Shan, MD    01/18/2021 -   Chief Complaint  Patient presents with   Follow-up    Pt states he has been doing okay since last visit. States he still becomes SOB with exertion and also has had an occ cough.   Follow-up history of Wegener's with left lower lobe nodule -Follow-up remote smoking Follow-up dyspnea on exertion -Has associated coronary  artery disease.and gr 2 ddx  HPI Brian Hudson 73 y.o. -presents with her sister for routine follow-up.  He continues to as always complain of worsening shortness of breath at each visit.  Again he is doing the same.  In the spring 2022 I referred him back to Dr. Donato Schultz because there was no ILD on the CT scan.  He has grade 2 diastolic dysfunction.  Dr. Anne Fu is evaluated him.  Since then he seen [facility] physicians.  This month 2022 approximately around January 11, 2021 he had blood work.  Shows hemoglobin of 11 g%.  This is a says it is new onset anemia.  His creatinine was 1.46 mg percent.  This is consistent with the April 2022 baseline but worsened in 2019.  This is what it is Wagner's his back but he denies any hematuria or hemoptysis.  He has not had a urine analysis checked.  He is willing to have that done.  There is no pedal edema.  Nevertheless the sister thinks that he is genuinely worse in the past 1 week.  There is no syncope.  He takes empiric Arnuity [last visit we cut his Symbicort down to Arnuity].  There is no obvious emphysema on the CT.  I discussed about giving empiric trial with Spiriva he is willing to do that.  Is just to see if this would help his dyspnea because of the mild air trapping seen on CT chest  in spring 2022.  There is no cough or wheezing or fever or weight loss.    CT Chest data  No results found.     OV 04/26/2021  Subjective:  Patient ID: Brian Hudson, male , DOB: [birth year] , age 3 y.o. , MRN: [id number] , ADDRESS: [address] [place] [address] PCP Marden Noble, MD Patient Care Team: Marden Noble, MD as PCP - General (Internal Medicine) Jake Bathe, MD as PCP - Cardiology (Cardiology)  This Provider for this visit: Treatment Team:  Attending Provider: Kalman Shan, MD    04/26/2021 -   Chief Complaint  Patient presents with   Follow-up    Pt states he has been doing okay since last visit and denies any complaints.      HPI Brian Hudson 73 y.o. -returns for follow-up.  After seeing me last he says he is actually doing okay he has stable dyspnea on exertion when he does gardening.  Improves with rest.  He says he is learned to pace himself.  I do not have any recent blood work on him.  I think he has it done at [facility] physicians.  There is nothing in care everywhere.  He takes inhalers and he is stable.  At last visit his ANA and ANCA was positive.  I discussed with nurse practitioner to refer him back to Dr. Zenovia Jordan his rheumatologist who he saw last for Wegener's some years ago.  He has not followed up with her.  He is willing to be referred to her again.  He is up-to-date with flu shot.  No new issues.   OV 06/13/2023  Subjective:  Patient ID: Brian Hudson, male , DOB: Jan 06, 1951 , age 20 y.o. , MRN: [id number] , ADDRESS: [address] [place] [address] PCP Emilio Aspen, MD Patient Care Team: Emilio Aspen, MD as PCP - General (Internal Medicine) Jake Bathe, MD as PCP - Cardiology (Cardiology)  This Provider for this visit: Treatment Team:  Attending Provider: Chilton Greathouse, MD    06/13/2023 -   Chief Complaint  Patient presents with   History of granulomatosis with polyangiitis   Follow-up history of Wegener's with left lower lobe nodule 1.9 cm groundglass opacities last checked 2022  -2023 ANCA panel numbers going up -Follow-up remote smoking Follow-up dyspnea on exertion -Has associated coronary artery disease.and gr 2 ddx  -MI in April 2024 with stents and also after that echocardiogram grade 1 diastolic dysfunction. -Chronic kidney disease HPI Brian Hudson 73 y.o. -returns for follow-up.  Last seen 2 years ago.  No presents with his son who is a physical therapy assistant at [employer] and his sister.  They are worried about his health because in April 2024 he had MI and had stents by Dr. Lynnette Caffey.  However he states that he is actually doing  better.  He always has chronic shortness of breath..  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  He has not had any hemoptysis.  The main concern the family has he is on and off vomiting which they think is because of the Ozempic.  He is seeing GI for this.  In terms of his nodule: He was supposed to have a scan in 2024 but this has not happened.  I have ordered that  In terms of his  [place]: In 2023 his numbers were going up but he is not having any active hemoptysis.  He has an appointment upcoming with Dr. Kathe Mariner  In terms of his chronic kidney disease last creatinine was April 2024 and the numbers were going up.  He has upcoming appointment Dr. Allena Katz in nephrology.  I offered to check renal function today and ANCA panel and they are appreciative of that..  Also get a QuantiFERON gold.  Also get ANCA profile.    In terms of smoking: Continues to be in remission. PFT     Latest Ref Rng & Units 06/28/2020    9:02 AM  PFT Results  FVC-Pre L 4.63   FVC-Predicted Pre % 114   FVC-Post L 4.72   FVC-Predicted Post % 116   Pre FEV1/FVC % % 75   Post FEV1/FCV % % 76   FEV1-Pre L 3.45   FEV1-Predicted Pre % 116   FEV1-Post L 3.57   DLCO uncorrected ml/min/mmHg 23.01   DLCO UNC% % 95   DLCO corrected ml/min/mmHg 23.01   DLCO COR %Predicted % 95   DLVA Predicted % 78   TLC L 7.05   TLC % Predicted % 106   RV % Predicted % 90        LAB RESULTS last 96 hours No results found.       has a past medical history of Arthritis, CAD (coronary artery disease), Concussion, Depression, DM (diabetes mellitus) (HCC), Dyspnea, Dysrhythmia, ED (erectile dysfunction), Fracture (2010), GERD (gastroesophageal reflux disease), HTN (hypertension), Hyperlipidemia, IBS (irritable bowel syndrome), Palpitations, Peripheral vascular disease (HCC), Pneumonia, Prostate cancer (HCC) (2007), Sigmoid  diverticulosis, and Wegener's granulomatosis.   reports that he quit smoking about 32 years ago. His smoking use included cigarettes. He started smoking about 63 years ago. He has a 77.5 pack-year smoking history. He has never used smokeless tobacco.  Past Surgical History:  Procedure Laterality Date   CARDIAC CATHETERIZATION     COLONOSCOPY N/A 06/24/2016   Procedure: COLONOSCOPY with propofol;  Surgeon: Charolett Bumpers, MD;  Location: [facility] ENDOSCOPY;  Service: Endoscopy;  Laterality: N/A;   colonscopy  2008   CORONARY PRESSURE/FFR STUDY N/A 09/20/2016   Procedure: Intravascular Pressure Wire/FFR Study;  Surgeon: Marykay Lex, MD;  Location: [facility] INVASIVE CV LAB;  Service: Cardiovascular;  Laterality: N/A;   CORONARY STENT INTERVENTION N/A 09/20/2016   Procedure: Coronary Stent Intervention;  Surgeon: Marykay Lex, MD;  Location[facility] INVASIVE CV LAB;  Service: Cardiovascular;  Laterality: N/A;   CORONARY STENT INTERVENTION N/A 05/23/2017   Procedure: CORONARY STENT INTERVENTION;  Surgeon: Kathleene Hazel, MD;  Location: [facility] INVASIVE CV LAB;  Service: Cardiovascular;  Laterality: N/A;   CORONARY/GRAFT ACUTE MI REVASCULARIZATION N/A 07/21/2022   Procedure: Coronary/Graft Acute MI Revascularization;  Surgeon: Orbie Pyo, MD;  Location: [facility] INVASIVE CV LAB;  Service: Cardiovascular;  Laterality: N/A;   INSERTION PROSTATE RADIATION SEED  2007   LEFT HEART CATH AND CORONARY ANGIOGRAPHY N/A 09/20/2016   Procedure: Left Heart Cath and Coronary Angiography;  Surgeon: Marykay Lex, MD;  Location: [facility] INVASIVE CV LAB;  Service: Cardiovascular;  Laterality: N/A;   LEFT HEART CATH AND CORONARY ANGIOGRAPHY N/A 05/23/2017   Procedure: LEFT HEART CATH AND CORONARY ANGIOGRAPHY;  Surgeon: Kathleene Hazel, MD;  Location: [facility] INVASIVE CV LAB;  Service: Cardiovascular;  Laterality: N/A;   LEFT HEART CATH AND CORONARY ANGIOGRAPHY N/A 07/21/2022   Procedure: LEFT HEART CATH AND CORONARY  ANGIOGRAPHY;  Surgeon: Lynnette Caffey,  Charlies Constable, MD;  Location: [facility] INVASIVE CV LAB;  Service: Cardiovascular;  Laterality: N/A;   PARATHYROIDECTOMY Right 02/26/2018   Procedure: RIGHT INFERIOR PARATHYROIDECTOMY;  Surgeon: Darnell Level, MD;  Location: [facility] ORS;  Service: General;  Laterality: Right;   ROTATOR CUFF REPAIR Right     Allergies  Allergen Reactions   Atorvastatin Other (See Comments)    arthralgia Other reaction(s): arthralgias   Codeine Other (See Comments)    "jittery"   Ezetimibe Other (See Comments)    arthralgia Other reaction(s): ? side effect   Zocor [Simvastatin] Other (See Comments)    arthralgia    Immunization History  Administered Date(s) Administered   Influenza Split 01/24/2009, 01/19/2012, 01/18/2013   Influenza, High Dose Seasonal PF 03/09/2015, 01/06/2017, 01/22/2017, 01/08/2018, 01/20/2019, 03/01/2020, 03/14/2021   Influenza,inj,Quad PF,6+ Mos 01/18/2011   PFIZER(Purple Top)SARS-COV-2 Vaccination 12/09/2019, 12/31/2019   PNEUMOCOCCAL CONJUGATE-20 05/05/2023   Pneumococcal Conjugate-13 03/09/2015   Pneumococcal Polysaccharide-23 02/19/2012, 01/08/2018   Td 04/09/1999   Tdap 05/03/2020   Zoster, Live 03/01/2020, 05/03/2020    Family History  Problem Relation Age of Onset   Heart attack Father 34   CAD Father    Diabetic kidney disease Mother    Healthy Sister    Cancer Brother    Healthy Sister      Current Outpatient Medications:    acetaminophen (TYLENOL) 325 MG tablet, Take 650 mg by mouth every morning., Disp: , Rfl:    aspirin 81 MG chewable tablet, Chew 1 tablet (81 mg total) by mouth daily., Disp: 90 tablet, Rfl: 2   empagliflozin (JARDIANCE) 25 MG TABS tablet, Take 25 mg by mouth daily., Disp: , Rfl:    escitalopram (LEXAPRO) 10 MG tablet, Take 10 mg by mouth daily., Disp: , Rfl:    Evolocumab (REPATHA SURECLICK) 140 MG/ML SOAJ, Inject 140 mg into the skin every 14 (fourteen) days., Disp: , Rfl:    Fluticasone Furoate (ARNUITY ELLIPTA) 100  MCG/ACT AEPB, Inhale into the lungs., Disp: , Rfl:    glimepiride (AMARYL) 1 MG tablet, Take 1 mg by mouth every morning., Disp: , Rfl:    Multiple Vitamin (MULTIVITAMIN WITH MINERALS) TABS tablet, Take 1 tablet by mouth daily., Disp: , Rfl:    nitroGLYCERIN (NITROSTAT) 0.4 MG SL tablet, Place 1 tablet (0.4 mg total) under the tongue every 5 (five) minutes x 3 doses as needed for chest pain., Disp: 25 tablet, Rfl: 4   pantoprazole (PROTONIX) 40 MG tablet, Take 1 tablet (40 mg total) by mouth 2 (two) times daily., Disp: 60 tablet, Rfl: 2   rosuvastatin (CRESTOR) 40 MG tablet, TAKE 1 TABLET BY MOUTH EVERY DAY, Disp: 90 tablet, Rfl: 3   Semaglutide,0.25 or 0.5MG /DOS, (OZEMPIC, 0.25 OR 0.5 MG/DOSE,) 2 MG/3ML SOPN, 0.25mg  weekly for 4 weeks then 0.5mg  weekly after that, Disp: , Rfl:    ticagrelor (BRILINTA) 90 MG TABS tablet, Take 1 tablet (90 mg total) by mouth 2 (two) times daily., Disp: 180 tablet, Rfl: 3   valsartan (DIOVAN) 80 MG tablet, 1 tablet (Patient not taking: Reported on 06/13/2023), Disp: , Rfl:       Objective:   Vitals:   06/13/23 1021  BP: 118/72  Pulse: (!) 54  SpO2: 99%  Weight: 165 lb (74.8 kg)  Height: 5\' 8"  (1.727 m)    Estimated body mass index is 25.09 kg/m as calculated from the following:   Height as of this encounter: 5\' 8"  (1.727 m).   Weight as of this encounter: 165 lb (74.8 kg).  @  Marinus Maw  Filed Weights   06/13/23 1021  Weight: 165 lb (74.8 kg)     Physical Exam   General: No distress. Look swell O2 at rest: no Cane present: no Sitting in wheel chair: no Frail: no Obese: no Neuro: Alert and Oriented x 3. GCS 15. Speech normal Psych: Pleasant Resp:  Barrel Chest - no.  Wheeze - no, Crackles - no, No overt respiratory distress CVS: Normal heart sounds. Murmurs - no Ext: Stigmata of Connective Tissue Disease - no HEENT: Normal upper airway. PEERL +. No post nasal drip        Assessment:       ICD-10-CM   1. DOE (dyspnea on exertion)   R06.09 Pulmonary function test    CT Chest Wo Contrast    ANCA Screen Reflex Titer    Sed Rate (ESR)    CBC w/Diff    Comp Met (CMET)    QuantiFERON-TB Gold Plus    2. Nodule of left lung  R91.1 Pulmonary function test    CT Chest Wo Contrast    ANCA Screen Reflex Titer    Sed Rate (ESR)    CBC w/Diff    Comp Met (CMET)    QuantiFERON-TB Gold Plus    3. ANCA-associated vasculitis (HCC)  I77.82 Pulmonary function test    CT Chest Wo Contrast    ANCA Screen Reflex Titer    Sed Rate (ESR)    CBC w/Diff    Comp Met (CMET)    QuantiFERON-TB Gold Plus    4. Chronic kidney disease, unspecified CKD stage  N18.9 Pulmonary function test    CT Chest Wo Contrast    ANCA Screen Reflex Titer    Sed Rate (ESR)    CBC w/Diff    Comp Met (CMET)    QuantiFERON-TB Gold Plus    5. Stopped smoking with greater than 40 pack year history  Z87.891 Pulmonary function test    CT Chest Wo Contrast    ANCA Screen Reflex Titer    Sed Rate (ESR)    CBC w/Diff    Comp Met (CMET)    QuantiFERON-TB Gold Plus    6. History of anemia  Z86.2 Pulmonary function test    CT Chest Wo Contrast    ANCA Screen Reflex Titer    Sed Rate (ESR)    CBC w/Diff    Comp Met (CMET)    QuantiFERON-TB Gold Plus         Plan:     Patient Instructions  History of Wegener's granulomatosis  CKD  - in 2022 ANCA antibodies were increased and in April 2024 kidney function getting worse  Plan  - ANCA check - SEd rate check  - Quantiferon gold check - CBC and BMET check   History of anemia 2023  - resolved April 2024  Plan  - check CBC 06/13/2023  Shortness of breath Mild air trapping on  prior CT  - -Shortness of breath mostl likely due to stiff heart muscle aka diastolic dysfunction but currently no ttaking arnuity  - improved after MI April 2024 and now working part time  PLAN - do PFT in 8 weeks  1.9 cm GGO Nodule of left lung - last seen dec 2019 -> stable/sligh increased  march 2022  Former  Smoker   Plan  CT chest wo contrast next few weeks -> then followup APP  Follouwp 8 weeks app to discuss results   FOLLOWUP Return in about 8 weeks (around 08/08/2023)  for with any of the APPS but after PFT, ct and blood work.    SIGNATURE    Dr. Kalman Shan, M.D., F.C.C.P,  Pulmonary and Critical Care Medicine Staff Physician, [facility] Center Director - Interstitial Lung Disease  Program  Pulmonary Fibrosis [facility] at [facility] [place], [place], [address]  Pager: [phone], If no answer or between  15:00h - 7:00h: call [phone] Telephone: [phone]  10:54 AM 06/13/2023

## 2023-06-16 DIAGNOSIS — R0609 Other forms of dyspnea: Secondary | ICD-10-CM | POA: Diagnosis not present

## 2023-06-16 DIAGNOSIS — R911 Solitary pulmonary nodule: Secondary | ICD-10-CM | POA: Diagnosis not present

## 2023-06-16 DIAGNOSIS — I7782 Antineutrophilic cytoplasmic antibody (ANCA) vasculitis: Secondary | ICD-10-CM | POA: Diagnosis not present

## 2023-06-16 DIAGNOSIS — N189 Chronic kidney disease, unspecified: Secondary | ICD-10-CM | POA: Diagnosis not present

## 2023-06-16 LAB — COMPREHENSIVE METABOLIC PANEL
ALT: 26 U/L (ref 0–53)
AST: 24 U/L (ref 0–37)
Albumin: 4 g/dL (ref 3.5–5.2)
Alkaline Phosphatase: 56 U/L (ref 39–117)
BUN: 14 mg/dL (ref 6–23)
CO2: 26 meq/L (ref 19–32)
Calcium: 9.2 mg/dL (ref 8.4–10.5)
Chloride: 106 meq/L (ref 96–112)
Creatinine, Ser: 1.44 mg/dL (ref 0.40–1.50)
GFR: 47.94 mL/min — ABNORMAL LOW (ref >60.00)
Glucose, Bld: 134 mg/dL — ABNORMAL HIGH (ref 70–99)
Potassium: 4.4 meq/L (ref 3.5–5.1)
Sodium: 140 meq/L (ref 135–145)
Total Bilirubin: 0.4 mg/dL (ref 0.2–1.2)
Total Protein: 7 g/dL (ref 6.0–8.3)

## 2023-06-16 LAB — CBC WITH DIFFERENTIAL/PLATELET
Basophils Absolute: 0 10*3/uL (ref 0.0–0.1)
Basophils Relative: 0.8 % (ref 0.0–3.0)
Eosinophils Absolute: 0.2 10*3/uL (ref 0.0–0.7)
Eosinophils Relative: 3.8 % (ref 0.0–5.0)
HCT: 38.8 % — ABNORMAL LOW (ref 39.0–52.0)
Hemoglobin: 13 g/dL (ref 13.0–17.0)
Lymphocytes Relative: 21.1 % (ref 12.0–46.0)
Lymphs Abs: 1.2 10*3/uL (ref 0.7–4.0)
MCHC: 33.5 g/dL (ref 30.0–36.0)
MCV: 89.1 fl (ref 78.0–100.0)
Monocytes Absolute: 0.4 10*3/uL (ref 0.1–1.0)
Monocytes Relative: 7.6 % (ref 3.0–12.0)
Neutro Abs: 3.9 10*3/uL (ref 1.4–7.7)
Neutrophils Relative %: 66.7 % (ref 43.0–77.0)
Platelets: 158 10*3/uL (ref 150.0–400.0)
RBC: 4.36 Mil/uL (ref 4.22–5.81)
RDW: 14.7 % (ref 11.5–15.5)
WBC: 5.8 10*3/uL (ref 4.0–10.5)

## 2023-06-16 LAB — SEDIMENTATION RATE: Sed Rate: 20 mm/h (ref 0–20)

## 2023-06-18 ENCOUNTER — Ambulatory Visit (HOSPITAL_COMMUNITY)
Admission: RE | Admit: 2023-06-18 | Discharge: 2023-06-18 | Disposition: A | Discharge status: Home / Self Care | Source: Ambulatory Visit | Attending: Internal Medicine | Admitting: Internal Medicine

## 2023-06-18 DIAGNOSIS — N189 Chronic kidney disease, unspecified: Secondary | ICD-10-CM | POA: Diagnosis not present

## 2023-06-18 DIAGNOSIS — R911 Solitary pulmonary nodule: Secondary | ICD-10-CM | POA: Diagnosis not present

## 2023-06-18 DIAGNOSIS — R0609 Other forms of dyspnea: Secondary | ICD-10-CM | POA: Diagnosis not present

## 2023-06-18 DIAGNOSIS — J479 Bronchiectasis, uncomplicated: Secondary | ICD-10-CM | POA: Diagnosis not present

## 2023-06-18 DIAGNOSIS — I7782 Antineutrophilic cytoplasmic antibody (ANCA) vasculitis: Secondary | ICD-10-CM | POA: Diagnosis not present

## 2023-06-18 DIAGNOSIS — Z87891 Personal history of nicotine dependence: Secondary | ICD-10-CM | POA: Diagnosis not present

## 2023-06-18 DIAGNOSIS — Z862 Personal history of diseases of the blood and blood-forming organs and certain disorders involving the immune mechanism: Secondary | ICD-10-CM | POA: Diagnosis not present

## 2023-06-18 DIAGNOSIS — R918 Other nonspecific abnormal finding of lung field: Secondary | ICD-10-CM | POA: Diagnosis not present

## 2023-06-21 LAB — QUANTIFERON-TB GOLD PLUS
Mitogen-NIL: 8.02 [IU]/mL
NIL: 0.07 [IU]/mL
QuantiFERON-TB Gold Plus: NEGATIVE
TB1-NIL: 0.01 [IU]/mL
TB2-NIL: 0.01 [IU]/mL

## 2023-06-21 LAB — ANCA SCREEN W REFLEX TITER: ANCA SCREEN: NEGATIVE

## 2023-06-30 ENCOUNTER — Telehealth: Payer: Self-pay

## 2023-06-30 NOTE — Telephone Encounter (Signed)
 RECEIVED APP ON FAX, REVIEWING

## 2023-07-01 NOTE — Telephone Encounter (Signed)
 Please have provider sign and date the provider portion of the Brilinta app (see chart media). Please fax completed form to [phone]

## 2023-07-01 NOTE — Telephone Encounter (Signed)
 Will have Dr. Anne Fu sign the Provider form of pts Brilinta application, when he returns to the office on tomorrow 3/26.  Will fax signed form back to our pt assistance team thereafter.   Will cc Dr. Anne Fu RN on this message, to make her aware of this plan.

## 2023-07-03 NOTE — Telephone Encounter (Signed)
 Marland Kitchen

## 2023-07-03 NOTE — Telephone Encounter (Signed)
 PAP: Application for Brilinta has been submitted to [employer] ([employer]), via fax If patient needs update in the meantime, please refer them to [employer] at [phone]

## 2023-07-03 NOTE — Telephone Encounter (Signed)
 Received, thank you

## 2023-07-03 NOTE — Telephone Encounter (Signed)
 Signed/dated by Dr. Anne Fu and sent to [facility].

## 2023-07-04 NOTE — Telephone Encounter (Signed)
 Left message for pt to call back to discuss medication change as ordered.

## 2023-07-04 NOTE — Telephone Encounter (Signed)
 Per PAP company they need a new prescription (see chart media). Please send in prescription for Brilinta to [facility]

## 2023-07-04 NOTE — Telephone Encounter (Signed)
 Will have Dr Anne Fu to review to determine how long pt needs to continue taking both ASA and Brilinta.

## 2023-07-07 NOTE — Telephone Encounter (Signed)
 Left another message on pt's voicemail to call back to discuss medication change

## 2023-07-08 ENCOUNTER — Ambulatory Visit: Payer: Medicare Other | Admitting: Internal Medicine

## 2023-07-09 ENCOUNTER — Ambulatory Visit: Admitting: Internal Medicine

## 2023-07-09 ENCOUNTER — Encounter: Payer: Self-pay | Admitting: Internal Medicine

## 2023-07-09 ENCOUNTER — Other Ambulatory Visit (HOSPITAL_COMMUNITY): Payer: Self-pay

## 2023-07-09 VITALS — BP 122/60 | HR 62 | Temp 98.5°F | Ht 68.0 in | Wt 163.8 lb

## 2023-07-09 DIAGNOSIS — I7782 Antineutrophilic cytoplasmic antibody (ANCA) vasculitis: Secondary | ICD-10-CM | POA: Diagnosis not present

## 2023-07-09 DIAGNOSIS — J301 Allergic rhinitis due to pollen: Secondary | ICD-10-CM

## 2023-07-09 DIAGNOSIS — R911 Solitary pulmonary nodule: Secondary | ICD-10-CM

## 2023-07-09 DIAGNOSIS — Z87891 Personal history of nicotine dependence: Secondary | ICD-10-CM | POA: Diagnosis not present

## 2023-07-09 DIAGNOSIS — R0609 Other forms of dyspnea: Secondary | ICD-10-CM | POA: Diagnosis not present

## 2023-07-09 MED ORDER — PREDNISONE 10 MG PO TABS: ORAL_TABLET | ORAL | 0 refills | Status: DC

## 2023-07-09 NOTE — Patient Instructions (Addendum)
 History of Wegener's granulomatosis  CKD  - in 2022 ANCA antibodies were increased and in April 2024 kidney function getting worse - March 2025: ANCA antibody negative and Quantiferon Gold negative - improved kideny function march 2025  Plan - per renal doc  History of anemia 2023  - resolved April 2024 and March 2025  Plan  - check CBC 06/13/2023  Shortness of breath Mild air trapping on  prior CT  - -Shortness of breath mostl likely due to stiff heart muscle aka diastolic dysfunction but currently no ttaking arnuity  - improved after MI April 2024 and now working part time [employer]  - CT scan chest march 2025 without reson for shortness of breath  PLAN - postpone PFT to  6 montns  1.9 cm GGO Nodule of left lung - last seen dec 2019 -> stable/sligh increased  march 2022  and March 2025 Former Smoker  - radiology says to scan > 12 months  Plan  CT chest wo contrast in 12 months  HAY FEVER - NEW 07/09/2023  Plan  - Please take prednisone 40 mg x1 day, then 30 mg x1 day, then 20 mg x1 day, then 10 mg x1 day, and then 5 mg x1 day and stop   Follouwp 6 months after PFT   - canclel May 2025 appointments office visit and PFT

## 2023-07-09 NOTE — Progress Notes (Signed)
 Subjective:     Patient ID: Brian Hudson, male   DOB: 11-Sep-1950, 73 y.o.   MRN: [id number]  HPI PCP Marden Noble, MD   IOV 08/21/2016  Chief Complaint  Patient presents with   Pulm Consult    Referred by Azucena Fallen PA-C for vasculitis.    73 year old male referred by Dr. Zenovia Jordan and of physician assistant for evaluation of shortness of breath in the setting of previous diagnosis of Wegner's. There is very little outside information but in terms of his Wegener's granulomatosis he tells me that he was diagnosed with this in 2004 following significant sinus symptoms including bloody sinus drainage. This was at [facility]. He was treated for approximately one year with Cytoxan and subsequently on maintenance methotrexate. His last treatment for Wegener's was in 2008. Since then he's been on observation therapy. Treatment Back then  included prednisone as well. Then approximately 2 or 3 years ago he switched care to Dr. Zenovia Jordan and rheumatology. According to him his been on observation therapy there as well. He tells me now for the last year or 2 he's been more fatigue associated with shortness of breath. He used to walk 2 miles a day but now is unable to do that. Dyspnea is present all the time but definitely more so with exertion and relieved by rest. There is associated cough and sometimes hemoptysis especially early in the morning but this is been ongoing for several years. He thinks his Wegener's might be active. He believes Dr. Zenovia Jordan is done some blood work to look for vasculitis activity but I do not have those results with me. A CT scan of the chest was done that showed left upper lobe groundglass opacity and therefore he is been here. He denies any chest pain     FeNO 34ppb 08/21/2016 and slightly high  Walking desaturation test on 08/21/2016 185 feet x 3 laps on RA:  did NOT desaturate. Rest pulse ox was 100%, final pulse ox was 90. HR response  50/min at rest to 62/min at peak exertion.    IMPRESSION: CT scan of the chest personally visualized 1. No acute abnormality is seen on CT of the chest. 2. Coronary artery calcifications. 3. Minimal ground-glass opacity in the left upper lobe of doubtful significance. Initial follow-up with CT at 6-12 months is recommended to confirm persistence. If persistent, repeat CT is recommended every 2 years until 5 years of stability has been established. This recommendation follows the consensus statement: Guidelines for Management of Incidental Pulmonary Nodules Detected on CT Images: From the Fleischner Society 2017; Radiology 2017; 284:228-243.     Electronically Signed   By: Dwyane Dee M.D.   On: 06/11/2016 17:14    09/11/2016 PFT' Follow Up: Brian Hudson is a 74 y.o. male former smoker quit in 1994 with history of Wegner's Disease since 2004, and dyspnea on exertion.  Pt. Was seen by Dr. Marchelle Gearing 08/21/2016 for  dyspnea .Plan at that time was as follows: PLAN  - start symbicort 2 puff twice daily - take sample worth 1 month  - do ono test room air next few to several days  - refer cardiology  - Dr Jacinto Halim or [facility] first available - sign record releast to get more info on wegner from Dr Nickola Major esp blood work  - do full PFT  Patient presents today for follow-up. Patient has been compliant with Symbicort since 08/21/2016. He states he cannot tell any  significant difference. In his dyspnea. He states he continues to get short of breath at times with exercise and also when talking. He states his shortness of breath does not wake him from sleep. He continues to be unable to walk as he has done in the past. He was seen by Nada Boozer at [facility] heart care on 09/06/2016. Of note there is a significant family history which includes coronary artery disease in his father, who had MI at the age of 83.Risk factors include CAD, DM, HLD, + FH premature CAD, hx tobacco use and coronary calcifications on CT  of chest.The patient is Scheduled for an exercise stress test 6/14 and an event monitor 6/14 to rule out tachy bradycardia syndrome. He states he does have secretions in the morning, yellowish with occasional blood tinged secretions, that self resolves..Lab work has not been sent from Dr. Nickola Major In [place], therefore I am unable to review. Patient denies any fever, chest pain, orthopnea, or hemoptysis.   Test Results:  CT angiogram: 06/11/2016 No acute abnormality is seen on CT of the chest. 2. Coronary artery calcifications. 3. Minimal ground-glass opacity in the left upper lobe of doubtful significance. Initial follow-up with CT at 6-12 months is recommended to confirm persistence  Ambulatory saturation: 08/21/2016 185 feet 3 laps on room air. Did not desaturate. Resting pulse ox was 100%. Final pulse ox was 90%. Heart rate response 50/m at rest is 62/m at peak exertion.  EKG 09/06/2016 Sinus bradycardia with poor R-wave progression, heart rate 44   OV 03/24/2017 Chief Complaint  Patient presents with   Follow-up    CT scan 03/14/17.  Pt states that he still has some tightness in chest, has occ coughing, and is SOB all the time.  Pt also states that he has gotten choked several times while trying to eat.   Remote Wegner's granulomatosis in 2004 status post therapy. Remote smoking history  Follow-up shortness of breath: eing seen in pulmonary clinic for shortness of breath. Earlier this year we referred him to cardiology on basis of coronary artery calcification and strong family history. He status post coronary artery stent 2. After that his cardiac rehabilitation. Dyspnea is only some better he still has residual dyspnea  Follow-up left upper lobe lung nodule is a groundglass opacity. He had follow-up CT scan of the chest without contrast 03/14/2017. This is documented below. It appears the ground glass opacity persists but it is also "close to 2 cm. However he says that his  Val Eagle is under remission. He did see Dr. Nickola Major earlier this year and apparently blood work was normal.  New issue: Is complaining of nonspecific dysphagia 4 times in the last 2 weeks. It is for solids. He is pretty significant. No associated vomiting or aspiration. His sister confirms the same.  IMPRESSION: ct chest 03/14/17 Persistent 12 x 19 mm ground-glass opacity in the central left upper Lobe. Adenocarcinoma cannot be excluded. Follow up by CT is recommended in 12 months, with continued annual surveillance for a minimum of 3 Years. These recommendations are taken from: Recommendations for the Management of Subsolid Pulmonary Nodules Detected at CT: A Statement from the Fleischner Society Radiology 2013; 266:1, [phone].   Aortic Atherosclerosis (ICD10-I70.0).     Electronically Signed   By: Charline Bills M.D.   On: 03/14/2017 09:16   OV 04/29/2017  Chief Complaint  Patient presents with   Follow-up    PET scan done 04/11/17.  Pt has complaints of coughing (non-productive), shortness of breath  Follow-up shortness of breath in this patient with remote Wagnon history and remote smoking history Follow-up left lower lobe lung nodule  In terms of shortness of breath: He is compliant with his Symbicort for empiric asthma treatment.  He had coronary artery stents in the middle of 2018.  Despite this he is short of breath.  He says this frustrates him significantly.  He tells me that he is able to walk a block or climb steps without a problem or less of a problem.  And otherwise he does not been otherwise he does have some dyspnea on exertion for these activities.  But the significant amount of dyspnea happens when he is talking a lot.  He feels this is Wegener's dj vu again.  He did have Wegener's serology and did see Dr. Nickola Major in rheumatology end of 2018 and there is no evidence of Wegener's recurrence but he still feels this is white not measurable.  He has an appointment  upcoming with Dr. Donato Schultz his cardiologist.  In terms of left lower lobe lung nodule groundglass opacity: This was noted December 2018 CT chest.  He had a follow-up CT PET in January 2019 that showed this nodule was beginning to shrink.  His Wegner antibodies were negative/trace positive.  He has been reassured.  He requires a follow-up CT scan end of 2019.  New issue: Is complaining of chronic sinus discharge for months.  He feels is all Wegener's again.  I have repeatedly told him that so far we do not have evidence of recurrence.  OV 06/13/2017  Chief Complaint  Patient presents with   Follow-up    lung nodule, no SOB, no Wheezing, No chest tightness    Follow-up dyspnea: In terms of dyspnea he had a cardiopulmonary stress test that suggested coronary issues.  He then had a cardiac cath in February 2019 and the place a second coronary stent.  He tells me now that his dyspnea is better.  In fact when he talks he gets dyspnea and even this is better but he still has residual dyspnea.  He is trying to get into cardiac rehab and is waiting to hear from them.  There is no wheezing or cough  In terms of left lower lobe lung nodule groundglass opacity: He has follow-up scan pending in December 2019.  There are no other new issues.  OV 05/12/2020  Subjective:  Patient ID: Brian Hudson, male , DOB: Mar 31, 1951 , age 38 y.o. , MRN: [id number] , ADDRESS: [address] [place] [address] PCP Marden Noble, MD Patient Care Team: Marden Noble, MD as PCP - General (Internal Medicine) Jake Bathe, MD as PCP - Cardiology (Cardiology)  This Provider for this visit: Treatment Team:  Attending Provider: Kalman Shan, MD    05/12/2020 -   Chief Complaint  Patient presents with   Follow-up    SOB getting worse.    Follow-up history of Wegener's with left lower lobe nodule -Follow-up remote smoking Follow-up dyspnea on exertion -Has associated coronary artery  disease.   HPI DAMARION MENDIZABAL 73 y.o. -almost 3 years since I last saw him.  This is just under 3-year visit.  This is the further routine follow-up visit.  He tells me that in the last 3 years he had insidious onset of worsening shortness of breath.  This is accelerated in the last 6 months.  Talking makes it worse the worst is when he talks.  Although it is exertional when he  change his clothes and he climbs stairs.  He looks like of having a flat affect but it did not discuss any anxiety or depression in him.  He has not seen the cardiologist in a while.  He says he has upcoming appointment although I did not see it.  His cardiologist Dr. Donato Schultz.  He has a history of associated coronary artery disease and coronary artery stents but he denies any chest pain.  No orthopnea proximal nocturnal dyspnea or wheezing.  He does have a tickle in his throat when he lies down but otherwise no cough.  Last CT scan of the chest 2019 showing slight reduction in groundglass lesion in the left upper lobe.    CT Chest dat - dec 2019   IMPRESSION: 1. Stable to slightly smaller left upper lobe ground-glass lesion, measuring 9 x 18 mm today. Follow up by CT is recommended in 12 months, with continued annual surveillance for a minimum of 3 years. These recommendations are taken from: Recommendations for the Management of Subsolid Pulmonary Nodules Detected at CT: A Statement from the Fleischner Society Radiology 2013; 266:1, 960-454. 2.  Aortic Atherosclerois (ICD10-170.0)     Electronically Signed   By: Kennith Center M.D.   On: 03/11/2018 17:00     PFT  No flowsheet data found.      OV 06/28/2020  Subjective:  Patient ID: Brian Hudson, male , DOB: 03-22-1951 , age 55 y.o. , MRN: [id number] , ADDRESS: [address] [place] [address] PCP Marden Noble, MD Patient Care Team: Marden Noble, MD as PCP - General (Internal Medicine) Jake Bathe, MD as PCP - Cardiology  (Cardiology)  This Provider for this visit: Treatment Team:  Attending Provider: Kalman Shan, MD    06/28/2020 -   Chief Complaint  Patient presents with   Follow-up    PFT performed today.  States he has been doing okay since last visit and denies any real complaints.   Follow-up history of Wegener's with left lower lobe nodule -Follow-up remote smoking Follow-up dyspnea on exertion -Has associated coronary artery disease.   HPI LEOCADIO HEAL 73 y.o. -presents for follow-up of his worsening dyspnea.  This visit is to review test results.  He tells me that overall he is stable still has this dyspnea on exertion although this time he tells me it is present on and off.  Test results show the CT scan just shows mild air trapping his lung nodule is stable in 2-3 years but is worse over the last 4 years.  His pulmonary function test is normal his nitric oxide test is normal.  His echocardiogram shows grade 2 diastolic dysfunction.  He denies any hypertension obesity.  He has not seen his cardiologist Dr. Donato Schultz despite assurance at the last visit that he would see him.  He has never done pulmonary rehabilitation.  We discussed several care options and he has agreed to start with pulmonary rehabilitation and reestablish with cardiology.   FeNO   - 18ppb 06/28/2020   CT chest 06/07/20   IMPRESSION: 1. No evidence of interstitial lung disease. 2. Irregular ground-glass nodule in the left upper lobe, stable from 03/11/2018 but possibly slightly increased in prominence from baseline 06/11/2016. Follow-up CT chest without contrast in 2 years is recommended. This recommendation follows the consensus statement: Guidelines for Management of Small Pulmonary Nodules Detected on CT Images: From the Fleischner Society 2017; Radiology 2017; 284:228-243. 3. Mild air trapping is indicative of small  airways disease. 4. Aortic atherosclerosis (ICD10-I70.0). Coronary artery calcification.      Electronically Signed   By: Leanna Battles M.D.   On: 06/09/2020 09:25      ECHO march 2022   IMPRESSIONS     1. Left ventricular ejection fraction, by estimation, is 60 to 65%. The  left ventricle has normal function. The left ventricle has no regional  wall motion abnormalities. There is mild left ventricular hypertrophy.  Left ventricular diastolic parameters  are consistent with Grade II diastolic dysfunction (pseudonormalization).   2. Right ventricular systolic function is normal. The right ventricular  size is normal. There is normal pulmonary artery systolic pressure. The  estimated right ventricular systolic pressure is 31.7 mmHg.   3. Left atrial size was mildly dilated.   4. The mitral valve is normal in structure. Trivial mitral valve  regurgitation. No evidence of mitral stenosis.   5. The aortic valve is tricuspid. Aortic valve regurgitation is not  visualized. Mild aortic valve stenosis. Aortic valve area, by VTI measures  1.71 cm. Aortic valve mean gradient measures 10.0 mmHg.   6. The inferior vena cava is normal in size with greater than 50%  respiratory variability, suggesting right atrial pressure of 3 mmHg.    OV 01/18/2021  Subjective:  Patient ID: Brian Hudson, male , DOB: [birth year] , age 29 y.o. , MRN: [id number] , ADDRESS: [address] [place] [address] PCP Marden Noble, MD Patient Care Team: Marden Noble, MD as PCP - General (Internal Medicine) Jake Bathe, MD as PCP - Cardiology (Cardiology)  This Provider for this visit: Treatment Team:  Attending Provider: Kalman Shan, MD    01/18/2021 -   Chief Complaint  Patient presents with   Follow-up    Pt states he has been doing okay since last visit. States he still becomes SOB with exertion and also has had an occ cough.   Follow-up history of Wegener's with left lower lobe nodule -Follow-up remote smoking Follow-up dyspnea on exertion -Has associated coronary  artery disease.and gr 2 ddx  HPI Brian Hudson 73 y.o. -presents with her sister for routine follow-up.  He continues to as always complain of worsening shortness of breath at each visit.  Again he is doing the same.  In the spring 2022 I referred him back to Dr. Donato Schultz because there was no ILD on the CT scan.  He has grade 2 diastolic dysfunction.  Dr. Anne Fu is evaluated him.  Since then he seen [facility] physicians.  This month 2022 approximately around January 11, 2021 he had blood work.  Shows hemoglobin of 11 g%.  This is a says it is new onset anemia.  His creatinine was 1.46 mg percent.  This is consistent with the April 2022 baseline but worsened in 2019.  This is what it is Wagner's his back but he denies any hematuria or hemoptysis.  He has not had a urine analysis checked.  He is willing to have that done.  There is no pedal edema.  Nevertheless the sister thinks that he is genuinely worse in the past 1 week.  There is no syncope.  He takes empiric Arnuity [last visit we cut his Symbicort down to Arnuity].  There is no obvious emphysema on the CT.  I discussed about giving empiric trial with Spiriva he is willing to do that.  Is just to see if this would help his dyspnea because of the mild air trapping seen on CT chest  in spring 2022.  There is no cough or wheezing or fever or weight loss.    CT Chest data  No results found.     OV 04/26/2021  Subjective:  Patient ID: Brian Hudson, male , DOB: 1950-12-19 , age 20 y.o. , MRN: [id number] , ADDRESS: [address] [place] [address] PCP Marden Noble, MD Patient Care Team: Marden Noble, MD as PCP - General (Internal Medicine) Jake Bathe, MD as PCP - Cardiology (Cardiology)  This Provider for this visit: Treatment Team:  Attending Provider: Kalman Shan, MD    04/26/2021 -   Chief Complaint  Patient presents with   Follow-up    Pt states he has been doing okay since last visit and denies any complaints.      HPI JABER DUNLOW 73 y.o. -returns for follow-up.  After seeing me last he says he is actually doing okay he has stable dyspnea on exertion when he does gardening.  Improves with rest.  He says he is learned to pace himself.  I do not have any recent blood work on him.  I think he has it done at [facility] physicians.  There is nothing in care everywhere.  He takes inhalers and he is stable.  At last visit his ANA and ANCA was positive.  I discussed with nurse practitioner to refer him back to Dr. Zenovia Jordan his rheumatologist who he saw last for Wegener's some years ago.  He has not followed up with her.  He is willing to be referred to her again.  He is up-to-date with flu shot.  No new issues.   OV 06/13/2023  Subjective:  Patient ID: Brian Hudson, male , DOB: April 13, 1950 , age 2 y.o. , MRN: [id number] , ADDRESS: [address] [place] [address] PCP Emilio Aspen, MD Patient Care Team: Emilio Aspen, MD as PCP - General (Internal Medicine) Jake Bathe, MD as PCP - Cardiology (Cardiology)  This Provider for this visit: Treatment Team:  Attending Provider: Chilton Greathouse, MD    06/13/2023 -   Chief Complaint  Patient presents with   History of granulomatosis with polyangiitis    HPI Brian Hudson 73 y.o. -returns for follow-up.  Last seen 2 years ago.  No presents with his son who is a physical therapy assistant at [employer] and his sister.  They are worried about his health because in April 2024 he had MI and had stents by Dr. Lynnette Caffey.  However he states that he is actually doing better.  He always has chronic shortness of breath..  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  He has not had any hemoptysis.  The main concern the family has he is on and off vomiting which they think is because of the Ozempic.  He is seeing GI for this.  In  terms of his nodule: He was supposed to have a scan in 2024 but this has not happened.  I have ordered that  In terms of his Val Eagle: In 2023 his numbers were going up but he is not having any active hemoptysis.  He has an appointment upcoming with Dr. Kathe Mariner  In terms of his chronic kidney disease last creatinine was April 2024 and the numbers were going up.  He has upcoming appointment Dr. Allena Katz in nephrology.  I  offered to check renal function today and ANCA panel and they are appreciative of that..  Also get a QuantiFERON gold.  Also get ANCA profile.    In terms of smoking: Continues to be in remission.  OV 07/09/2023  Subjective:  Patient ID: Brian Hudson, male , DOB: 1951-01-16 , age 23 y.o. , MRN: [id number] , ADDRESS: [address] [place] [address] PCP Emilio Aspen, MD Patient Care Team: Emilio Aspen, MD as PCP - General (Internal Medicine) Jake Bathe, MD as PCP - Cardiology (Cardiology)  This Provider for this visit: Treatment Team:  Attending Provider: Kalman Shan, MD  Follow-up history of Wegener's with left lower lobe nodule 1.9 cm groundglass opacities last checked 2022  -2023 ANCA panel numbers going up -Follow-up remote smoking Follow-up dyspnea on exertion -Has associated coronary artery disease.and gr 2 ddx  -MI in April 2024 with stents and also after that echocardiogram grade 1 diastolic dysfunction. -Chronic kidney disease  07/09/2023 -   Chief Complaint  Patient presents with   Follow-up    Here to review Chest CT done 06/18/23. Pt c/o increased cough and wheezing over the past 4 days- relates to pollen. Cough is occ prod with green to yellow sputum.      HPI ANTAWAN MCHUGH 73 y.o. -returns for follow-up.  Presents with his son.  This visit he was supposed to also have his pulmonary function test done but that has not been scheduled.  Instead he is here for his blood work and CT scan of the chest.  Blood work shows  his ANCA is now negative.  His QuantiFERON gold is negative.  His pulmonary nodule groundglass opacity still stable/slightly enlarged.  Radiology is recommending CT scan in 2 years but he is only 14 and is smoked heavily in the past.  Therefore we took a shared decision making to do a CT scan in 1 year.  His dyspnea overall is better.  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  Therefore we took a shared decision making to postpone his pulmonary function test by 6 months.  So he will see me in 6 months with a PFT.  He will see me again in 1 year after the CT scan.  PFT     Latest Ref Rng & Units 06/28/2020    9:02 AM  PFT Results  FVC-Pre L 4.63   FVC-Predicted Pre % 114   FVC-Post L 4.72   FVC-Predicted Post % 116   Pre FEV1/FVC % % 75   Post FEV1/FCV % % 76   FEV1-Pre L 3.45   FEV1-Predicted Pre % 116   FEV1-Post L 3.57   DLCO uncorrected ml/min/mmHg 23.01   DLCO UNC% % 95   DLCO corrected ml/min/mmHg 23.01   DLCO COR %Predicted % 95   DLVA Predicted % 78   TLC L 7.05   TLC % Predicted % 106   RV % Predicted % 90      Latest Reference Range & Units 06/16/23 08:30  ANCA SCREEN Negative  Negative  QUANTIFERON-TB GOLD PLUS  Rpt  Mitogen-NIL IU/mL 8.02  Rpt: View report in Results Review for more information   LAB RESULTS last 96 hours No results found.  CT chest 06/18/23   Mediastinum/Nodes: High right paratracheal node measuring 8 mm short axis, series 4, image 22, unchanged. No suspicious mediastinal lymphadenopathy. Hilar assessment is limited in the absence of IV contrast. Unremarkable esophagus.  Lungs/Pleura: Irregular ground-glass nodule in the left upper lobe measures 1.8 x 1.2 cm, series 5, image 41. This is unchanged from most recent exam. No solid components. Associated bronchiectasis, unchanged. 3 mm left lower lobe nodule, series 5, image 79, unchanged. 4 mm right middle lobe nodule series 5, image 85, unchanged. Right  lower lobe peri diaphragmatic nodule measuring 4 mm series 5, image 143, unchanged.   No new pulmonary nodule. No acute or focal airspace disease. No pleural fluid. Trachea and central airways are clear. IMPRESSION: 1. Irregular ground-glass nodule in the left upper lobe measuring 1.8 x 1.2 cm, unchanged from most recent exam, but minimally increased from 2018. No solid components. This exam constitutes 2 years of imaging stability. Continued follow-up recommended, noncontrast CT is recommended in 2 years. 2. Additional small solid bilateral pulmonary nodules are unchanged, and are considered definitively benign given 2 years of imaging stability. 3. Coronary artery calcifications. Aortic Atherosclerosis (ICD10-I70.0).     Electronically Signed   By: Narda Rutherford M.D.   On: 07/06/2023 10:13     has a past medical history of Arthritis, CAD (coronary artery disease), Concussion, Depression, DM (diabetes mellitus) (HCC), Dyspnea, Dysrhythmia, ED (erectile dysfunction), Fracture (2010), GERD (gastroesophageal reflux disease), HTN (hypertension), Hyperlipidemia, IBS (irritable bowel syndrome), Palpitations, Peripheral vascular disease (HCC), Pneumonia, Prostate cancer (HCC) (2007), Sigmoid diverticulosis, and Wegener's granulomatosis.   reports that he quit smoking about 32 years ago. His smoking use included cigarettes. He started smoking about 63 years ago. He has a 77.5 pack-year smoking history. He has never used smokeless tobacco.  Past Surgical History:  Procedure Laterality Date   CARDIAC CATHETERIZATION     COLONOSCOPY N/A 06/24/2016   Procedure: COLONOSCOPY with propofol;  Surgeon: Charolett Bumpers, MD;  Location: [facility];  Service: Endoscopy;  Laterality: N/A;   colonscopy  2008   CORONARY PRESSURE/FFR STUDY N/A 09/20/2016   Procedure: Intravascular Pressure Wire/FFR Study;  Surgeon: Marykay Lex, MD;  Location: [facility] INVASIVE CV LAB;  Service: Cardiovascular;   Laterality: N/A;   CORONARY STENT INTERVENTION N/A 09/20/2016   Procedure: Coronary Stent Intervention;  Surgeon: Marykay Lex, MD;  [facility] INVAS[facility] CV LAB;  Service: Cardiovascular;  Laterality: N/A;   CORONARY STENT INTERVENTION N/A 05/23/2017   Procedure: CORONARY STENT INTERVENTION;  Surgeon: Kathleene Hazel, MD;  Location: [facility] INVASIVE CV LAB;  Service: Cardiovascular;  Laterality: N/A;   CORONARY/GRAFT ACUTE MI REVASCULARIZATION N/A 07/21/2022   Procedure: Coronary/Graft Acute MI Revascularization;  Surgeon: Orbie Pyo, MD;  Location: [facility] INVASIVE CV LAB;  Service: Cardiovascular;  Laterality: N/A;   INSERTION PROSTATE RADIATION SEED  2007   LEFT HEART CATH AND CORONARY ANGIOGRAPHY N/A 09/20/2016   Procedure: Left Heart Cath and Coronary Angiography;  Surgeon: Marykay Lex, MD;  Location: [facility] INVASIVE CV LAB;  Service: Cardiovascular;  Laterality: N/A;   LEFT HEART CATH AND CORONARY ANGIOGRAPHY N/A 05/23/2017   Procedure: LEFT HEART CATH AND CORONARY ANGIOGRAPHY;  Surgeon: Kathleene Hazel, MD;  Location: [facility] INVASIVE CV LAB;  Service: Cardiovascular;  Laterality: N/A;   LEFT HEART CATH AND CORONARY ANGIOGRAPHY N/A 07/21/2022   Procedure: LEFT HEART CATH AND CORONARY ANGIOGRAPHY;  Surgeon: Orbie Pyo, MD;  Location: [facility] INVASIVE CV LAB;  Service: Cardiovascular;  Laterality: N/A;   PARATHYROIDECTOMY Right 02/26/2018   Procedure: RIGHT INFERIOR PARATHYROIDECTOMY;  Surgeon: Darnell Level, MD;  Location: [facility];  Service: General;  Laterality: Right;   ROTATOR CUFF REPAIR Right     Allergies  Allergen Reactions   Atorvastatin Other (See Comments)    arthralgia Other reaction(s): arthralgias   Codeine Other (See Comments)    "jittery"   Ezetimibe Other (See Comments)    arthralgia Other reaction(s): ? side effect   Zocor [Simvastatin] Other (See Comments)    arthralgia    Immunization History  Administered Date(s) Administered   Influenza Split  01/24/2009, 01/19/2012, 01/18/2013   Influenza, High Dose Seasonal PF 03/09/2015, 01/06/2017, 01/22/2017, 01/08/2018, 01/20/2019, 03/01/2020, 03/14/2021   Influenza,inj,Quad PF,6+ Mos 01/18/2011   Influenza-Unspecified 02/07/2023   PFIZER(Purple Top)SARS-COV-2 Vaccination 12/09/2019, 12/31/2019   PNEUMOCOCCAL CONJUGATE-20 05/05/2023   Pneumococcal Conjugate-13 03/09/2015   Pneumococcal Polysaccharide-23 02/19/2012, 01/08/2018   Td 04/09/1999   Tdap 05/03/2020   Zoster, Live 03/01/2020, 05/03/2020    Family History  Problem Relation Age of Onset   Heart attack Father 33   CAD Father    Diabetic kidney disease Mother    Healthy Sister    Cancer Brother    Healthy Sister      Current Outpatient Medications:    acetaminophen (TYLENOL) 325 MG tablet, Take 650 mg by mouth every morning., Disp: , Rfl:    aspirin 81 MG chewable tablet, Chew 1 tablet (81 mg total) by mouth daily., Disp: 90 tablet, Rfl: 2   empagliflozin (JARDIANCE) 25 MG TABS tablet, Take 25 mg by mouth daily., Disp: , Rfl:    escitalopram (LEXAPRO) 10 MG tablet, Take 10 mg by mouth daily., Disp: , Rfl:    Evolocumab (REPATHA SURECLICK) 140 MG/ML SOAJ, Inject 140 mg into the skin every 14 (fourteen) days., Disp: , Rfl:    Fluticasone Furoate (ARNUITY ELLIPTA) 100 MCG/ACT AEPB, Inhale 2 puffs into the lungs daily., Disp: , Rfl:    glimepiride (AMARYL) 1 MG tablet, Take 1 mg by mouth every morning., Disp: , Rfl:    Multiple Vitamin (MULTIVITAMIN WITH MINERALS) TABS tablet, Take 1 tablet by mouth daily., Disp: , Rfl:    nitroGLYCERIN (NITROSTAT) 0.4 MG SL tablet, Place 1 tablet (0.4 mg total) under the tongue every 5 (five) minutes x 3 doses as needed for chest pain., Disp: 25 tablet, Rfl: 4   pantoprazole (PROTONIX) 40 MG tablet, Take 1 tablet (40 mg total) by mouth 2 (two) times daily., Disp: 60 tablet, Rfl: 2   predniSONE (DELTASONE) 10 MG tablet, Please take prednisone 40 mg x1 day, then 30 mg x1 day, then 20 mg x1 day,  then 10 mg x1 day, and then 5 mg x1 day and stop, Disp: 20 tablet, Rfl: 0   rosuvastatin (CRESTOR) 40 MG tablet, TAKE 1 TABLET BY MOUTH EVERY DAY, Disp: 90 tablet, Rfl: 3   Semaglutide,0.25 or 0.5MG /DOS, (OZEMPIC, 0.25 OR 0.5 MG/DOSE,) 2 MG/3ML SOPN, 0.25mg  weekly for 4 weeks then 0.5mg  weekly after that, Disp: , Rfl:    ticagrelor (BRILINTA) 90 MG TABS tablet, Take 1 tablet (90 mg total) by mouth 2 (two) times daily., Disp: 180 tablet, Rfl: 3   valsartan (DIOVAN) 80 MG tablet, , Disp: , Rfl:       Objective:   Vitals:   07/09/23 1419  BP: 122/60  Pulse: 62  Temp: 98.5 F (36.9 C)  TempSrc: Oral  SpO2: 100%  Weight: 163 lb 12.8 oz (74.3 kg)  Height: 5\' 8"  (1.727 m)    Estimated body mass index is 24.91 kg/m as calculated from the following:   Height as of this encounter: 5\' 8"  (1.727 m).   Weight as of this encounter: 163 lb 12.8 oz (  74.3 kg).  @WEIGHTCHANGE @  [employer]   07/09/23 1419  Weight: 163 lb 12.8 oz (74.3 kg)     Physical Exam   General: No distress. Looks well O2 at rest: no Cane present: no Sitting in wheel chair: no Frail: no Obese: no Neuro: Alert and Oriented x 3. GCS 15. Speech normal Psych: Pleasant Resp:  Barrel Chest - no.  Wheeze - no, Crackles - no, No overt respiratory distress CVS: Normal heart sounds. Murmurs - no Ext: Stigmata of Connective Tissue Disease - no HEENT: Normal upper airway. PEERL +. No post nasal drip        Assessment:       ICD-10-CM   1. DOE (dyspnea on exertion)  R06.09 Pulmonary function test    2. Nodule of left lung  R91.1 Pulmonary function test    3. ANCA-associated vasculitis (HCC)  I77.82 Pulmonary function test    4. Stopped smoking with greater than 40 pack year history  Z87.891 Pulmonary function test    5. Hay fever  J30.1 Pulmonary function test         Plan:     Patient Instructions  History of Wegener's granulomatosis  CKD  - in 2022 ANCA antibodies were increased and in April 2024  kidney function getting worse - March 2025: ANCA antibody negative and Quantiferon Gold negative - improved kideny function march 2025  Plan - per renal doc  History of anemia 2023  - resolved April 2024 and March 2025  Plan  - check CBC 06/13/2023  Shortness of breath Mild air trapping on  prior CT  - -Shortness of breath mostl likely due to stiff heart muscle aka diastolic dysfunction but currently no ttaking arnuity  - improved after MI April 2024 and now working part time [employer]  - CT scan chest march 2025 without reson for shortness of breath  PLAN - postpone PFT to  6 montns  1.9 cm GGO Nodule of left lung - last seen dec 2019 -> stable/sligh increased  march 2022  and March 2025 Former Smoker  - radiology says to scan > 12 months  Plan  CT chest wo contrast in 12 months  HAY FEVER - NEW 07/09/2023  Plan  - Please take prednisone 40 mg x1 day, then 30 mg x1 day, then 20 mg x1 day, then 10 mg x1 day, and then 5 mg x1 day and stop   Follouwp 6 months after PFT   - canclel May 2025 appointments office visit and PFT   FOLLOWUP Return in about 6 months (around 01/08/2024) for 15 min visit, with any of the APPS after PFT.    SIGNATURE    Dr. Kalman Shan, M.D., F.C.C.P,  Pulmonary and Critical Care Medicine Staff Physician, [facility] Director - Interstitial Lung Disease  Program  Pulmonary Fibrosis [facility] [place], [place], [address]  Pager: [phone], If no answer or between  15:00h - 7:00h: call [phone] Telephone: [phone]  3:03 PM 07/09/2023

## 2023-07-10 NOTE — Progress Notes (Signed)
 Patient has not filled with [place] and was d/c by MDO. Disenrolling patient from [facility].

## 2023-07-30 ENCOUNTER — Encounter: Payer: Self-pay | Admitting: *Deleted

## 2023-07-30 DIAGNOSIS — Z006 Encounter for examination for normal comparison and control in clinical research program: Secondary | ICD-10-CM

## 2023-07-30 NOTE — Research (Cosign Needed Addendum)
 48-Week Follow-Up Visit Completed*   []  Not Necessary, No Potential Adverse Events Or Medication Issues Reported On Completed Subject Questionnaire   [x]  Yes, Contact With Subject/Alternate Contact Completed   []  Yes, No Contact With Subject/Alternate Contact Completed, But Electronic Health Record Was Reviewed   []  No, Unable To Contact Subject/Alternate Contact   Have you reviewed Ongoing medications on the Targeted Concomitant Medication form and updated the form as needed?   [x]  Yes   []  No   Subject Status*   [x]  Continuing In Follow-up   []  At Risk For Lost To Follow-up   []  Withdrawal From All Future Study Activities Including Passive Follow-up By Electronic Health Record Review Or Contact With Healthcare Provider Or Family Member/Friend   []  Death   Vital Status*   [x]  Alive   []  Deceased   []  Unknown   Last Known To Be Alive Source*   [x]  Subject Completed Follow-up Questionnaire/Seen In Person/Via Telephone Contact   []  Family Member or Caretaker   []  Primary Physician Or Medical Records   []  Publicly Available Source   []  Other  Date of last dose taken   27-Jul-2022  Over the last 12 weeks did the subject miss any doses?no  Over the last 12 weeks did the subject restart Evolocumab  after an interruption?no     Current Outpatient Medications on File Prior to Visit  Medication Sig Dispense Refill   acetaminophen  (TYLENOL ) 325 MG tablet Take 650 mg by mouth every morning.     aspirin  81 MG chewable tablet Chew 1 tablet (81 mg total) by mouth daily. 90 tablet 2   empagliflozin (JARDIANCE) 25 MG TABS tablet Take 25 mg by mouth daily.     escitalopram (LEXAPRO) 10 MG tablet Take 10 mg by mouth daily.     Evolocumab  (REPATHA  SURECLICK) 140 MG/ML SOAJ Inject 140 mg into the skin every 14 (fourteen) days.     Fluticasone Furoate  (ARNUITY ELLIPTA ) 100 MCG/ACT AEPB Inhale 2 puffs into the lungs daily.     glimepiride (AMARYL) 1 MG tablet Take 1 mg by  mouth every morning.     Multiple Vitamin (MULTIVITAMIN WITH MINERALS) TABS tablet Take 1 tablet by mouth daily.     nitroGLYCERIN  (NITROSTAT ) 0.4 MG SL tablet Place 1 tablet (0.4 mg total) under the tongue every 5 (five) minutes x 3 doses as needed for chest pain. 25 tablet 4   pantoprazole  (PROTONIX ) 40 MG tablet Take 1 tablet (40 mg total) by mouth 2 (two) times daily. 60 tablet 2   predniSONE  (DELTASONE ) 10 MG tablet Please take prednisone  40 mg x1 day, then 30 mg x1 day, then 20 mg x1 day, then 10 mg x1 day, and then 5 mg x1 day and stop 20 tablet 0   rosuvastatin  (CRESTOR ) 40 MG tablet TAKE 1 TABLET BY MOUTH EVERY DAY 90 tablet 3   Semaglutide,0.25 or 0.5MG /DOS, (OZEMPIC, 0.25 OR 0.5 MG/DOSE,) 2 MG/3ML SOPN 0.25mg  weekly for 4 weeks then 0.5mg  weekly after that     ticagrelor  (BRILINTA ) 90 MG TABS tablet Take 1 tablet (90 mg total) by mouth 2 (two) times daily. 180 tablet 3   valsartan (DIOVAN) 80 MG tablet      No current facility-administered medications on file prior to visit.

## 2023-08-02 ENCOUNTER — Other Ambulatory Visit: Payer: Self-pay | Admitting: Internal Medicine

## 2023-08-04 NOTE — Telephone Encounter (Signed)
 Advise if appropriate to continue

## 2023-08-08 ENCOUNTER — Encounter (HOSPITAL_BASED_OUTPATIENT_CLINIC_OR_DEPARTMENT_OTHER)

## 2023-08-11 DIAGNOSIS — N1831 Chronic kidney disease, stage 3a: Secondary | ICD-10-CM | POA: Diagnosis not present

## 2023-08-12 DIAGNOSIS — E1165 Type 2 diabetes mellitus with hyperglycemia: Secondary | ICD-10-CM | POA: Diagnosis not present

## 2023-08-14 DIAGNOSIS — L570 Actinic keratosis: Secondary | ICD-10-CM | POA: Diagnosis not present

## 2023-08-14 DIAGNOSIS — L821 Other seborrheic keratosis: Secondary | ICD-10-CM | POA: Diagnosis not present

## 2023-08-21 ENCOUNTER — Encounter: Payer: Self-pay | Admitting: *Deleted

## 2023-08-21 DIAGNOSIS — N1831 Chronic kidney disease, stage 3a: Secondary | ICD-10-CM | POA: Diagnosis not present

## 2023-08-21 DIAGNOSIS — I129 Hypertensive chronic kidney disease with stage 1 through stage 4 chronic kidney disease, or unspecified chronic kidney disease: Secondary | ICD-10-CM | POA: Diagnosis not present

## 2023-08-21 DIAGNOSIS — D631 Anemia in chronic kidney disease: Secondary | ICD-10-CM | POA: Diagnosis not present

## 2023-08-21 DIAGNOSIS — Z006 Encounter for examination for normal comparison and control in clinical research program: Secondary | ICD-10-CM

## 2023-08-21 DIAGNOSIS — E1122 Type 2 diabetes mellitus with diabetic chronic kidney disease: Secondary | ICD-10-CM | POA: Diagnosis not present

## 2023-08-22 ENCOUNTER — Ambulatory Visit: Admitting: Internal Medicine

## 2023-08-24 DIAGNOSIS — K047 Periapical abscess without sinus: Secondary | ICD-10-CM | POA: Diagnosis not present

## 2023-08-24 DIAGNOSIS — R0600 Dyspnea, unspecified: Secondary | ICD-10-CM | POA: Diagnosis not present

## 2023-08-24 DIAGNOSIS — R051 Acute cough: Secondary | ICD-10-CM | POA: Diagnosis not present

## 2023-09-08 ENCOUNTER — Ambulatory Visit: Admitting: Internal Medicine

## 2023-10-28 NOTE — Research (Signed)
 Week 60Follow-Up Visit Completed*   []  Not Necessary, No Potential Adverse Events Or Medication Issues Reported On Completed Subject Questionnaire   []  Yes, Contact With Subject/Alternate Contact Completed   [x]  Yes, No Contact With Subject/Alternate Contact Completed, But Electronic Health Record Was Reviewed   []  No, Unable To Contact Subject/Alternate Contact   Have you reviewed Ongoing medications on the Targeted Concomitant Medication form and updated the form as needed?   [x]  Yes   []  No   Subject Status*   [x]  Continuing In Follow-up   []  At Risk For Lost To Follow-up   []  Withdrawal From All Future Study Activities Including Passive Follow-up By Electronic Health Record Review Or Contact With Healthcare Provider Or Family Member/Friend   []  Death   Vital Status*   [x]  Alive   []  Deceased   []  Unknown   Last Known To Be Alive Source*   []  Subject Completed Follow-up Questionnaire/Seen In Person/Via Telephone Contact   []  Family Member or Caretaker   [x]  Primary Physician Or Medical Records   []  Publicly Available Source   []  Other

## 2023-11-03 DIAGNOSIS — I10 Essential (primary) hypertension: Secondary | ICD-10-CM | POA: Diagnosis not present

## 2023-11-03 DIAGNOSIS — E785 Hyperlipidemia, unspecified: Secondary | ICD-10-CM | POA: Diagnosis not present

## 2023-11-03 DIAGNOSIS — J449 Chronic obstructive pulmonary disease, unspecified: Secondary | ICD-10-CM | POA: Diagnosis not present

## 2023-11-03 DIAGNOSIS — Z23 Encounter for immunization: Secondary | ICD-10-CM | POA: Diagnosis not present

## 2023-11-03 DIAGNOSIS — F322 Major depressive disorder, single episode, severe without psychotic features: Secondary | ICD-10-CM | POA: Diagnosis not present

## 2023-11-11 ENCOUNTER — Telehealth: Payer: Self-pay | Admitting: Cardiology

## 2023-11-11 NOTE — Telephone Encounter (Signed)
 Pt's sister would like a c/b regarding ticagrelor  (BRILINTA ) 90 MG TABS tablet and receiving Patient Assistance for next refill. Please advise.

## 2023-11-11 NOTE — Telephone Encounter (Signed)
 Patient needed a 30 day supply of Brilinta , told patient's sister (DPR) that patient has refills at his pharmacy and he can request 30 day supply instead of the 90 days. Patient's sister verbalized understanding.

## 2023-11-27 ENCOUNTER — Encounter: Payer: Self-pay | Admitting: *Deleted

## 2023-11-27 DIAGNOSIS — I10 Essential (primary) hypertension: Secondary | ICD-10-CM | POA: Diagnosis not present

## 2023-11-27 DIAGNOSIS — I5032 Chronic diastolic (congestive) heart failure: Secondary | ICD-10-CM | POA: Diagnosis not present

## 2023-11-27 DIAGNOSIS — N1831 Chronic kidney disease, stage 3a: Secondary | ICD-10-CM | POA: Diagnosis not present

## 2023-11-27 DIAGNOSIS — E1122 Type 2 diabetes mellitus with diabetic chronic kidney disease: Secondary | ICD-10-CM | POA: Diagnosis not present

## 2023-11-27 DIAGNOSIS — Z006 Encounter for examination for normal comparison and control in clinical research program: Secondary | ICD-10-CM

## 2024-01-09 ENCOUNTER — Ambulatory Visit: Admitting: *Deleted

## 2024-01-09 DIAGNOSIS — I7782 Antineutrophilic cytoplasmic antibody (ANCA) vasculitis: Secondary | ICD-10-CM

## 2024-01-09 DIAGNOSIS — R0609 Other forms of dyspnea: Secondary | ICD-10-CM

## 2024-01-09 DIAGNOSIS — R911 Solitary pulmonary nodule: Secondary | ICD-10-CM

## 2024-01-09 DIAGNOSIS — J301 Allergic rhinitis due to pollen: Secondary | ICD-10-CM

## 2024-01-09 DIAGNOSIS — Z87891 Personal history of nicotine dependence: Secondary | ICD-10-CM

## 2024-01-09 LAB — PULMONARY FUNCTION TEST
DL/VA % pred: 74 %
DL/VA: 3 ml/min/mmHg/L
DLCO cor % pred: 88 %
DLCO cor: 21.02 ml/min/mmHg
DLCO unc % pred: 88 %
DLCO unc: 21.02 ml/min/mmHg
FEF 25-75 Post: 2.68 L/s
FEF 25-75 Pre: 1.98 L/s
FEF2575-%Change-Post: 35 %
FEF2575-%Pred-Post: 128 %
FEF2575-%Pred-Pre: 94 %
FEV1-%Change-Post: 6 %
FEV1-%Pred-Post: 120 %
FEV1-%Pred-Pre: 112 %
FEV1-Post: 3.43 L
FEV1-Pre: 3.21 L
FEV1FVC-%Change-Post: 1 %
FEV1FVC-%Pred-Pre: 97 %
FEV6-%Change-Post: 4 %
FEV6-%Pred-Post: 126 %
FEV6-%Pred-Pre: 121 %
FEV6-Post: 4.68 L
FEV6-Pre: 4.49 L
FEV6FVC-%Change-Post: 0 %
FEV6FVC-%Pred-Post: 105 %
FEV6FVC-%Pred-Pre: 106 %
FVC-%Change-Post: 4 %
FVC-%Pred-Post: 120 %
FVC-%Pred-Pre: 114 %
FVC-Post: 4.74 L
FVC-Pre: 4.53 L
Post FEV1/FVC ratio: 72 %
Post FEV6/FVC ratio: 99 %
Pre FEV1/FVC ratio: 71 %
Pre FEV6/FVC Ratio: 99 %
RV % pred: 97 %
RV: 2.36 L
TLC % pred: 109 %
TLC: 7.27 L

## 2024-01-09 NOTE — Patient Instructions (Signed)
 Full PFT performed today.

## 2024-01-09 NOTE — Progress Notes (Signed)
 Full PFT performed today.

## 2024-01-18 NOTE — Progress Notes (Unsigned)
 Subjective:     Patient ID: Brian Hudson, male   DOB: Mar 27, 1951, 73 y.o.   MRN: [id number]  HPI PCP Delice Charleston, MD   IOV 08/21/2016  Chief Complaint  Patient presents with   Pulm Consult    Referred by Rosaline Salt PA-C for vasculitis.    73 year old male referred by Dr. Jon Jacob and of physician assistant for evaluation of shortness of breath in the setting of previous diagnosis of Wegner's. There is very little outside information but in terms of his Wegener's granulomatosis he tells me that he was diagnosed with this in 2004 following significant sinus symptoms including bloody sinus drainage. This was at [facility]. He was treated for approximately one year with Cytoxan and subsequently on maintenance methotrexate. His last treatment for Wegener's was in 2008. Since then he's been on observation therapy. Treatment Back then  included prednisone  as well. Then approximately 2 or 3 years ago he switched care to Dr. Jon Jacob and rheumatology. According to him his been on observation therapy there as well. He tells me now for the last year or 2 he's been more fatigue associated with shortness of breath. He used to walk 2 miles a day but now is unable to do that. Dyspnea is present all the time but definitely more so with exertion and relieved by rest. There is associated cough and sometimes hemoptysis especially early in the morning but this is been ongoing for several years. He thinks his Wegener's might be active. He believes Dr. Jon Jacob is done some blood work to look for vasculitis activity but I do not have those results with me. A CT scan of the chest was done that showed left upper lobe groundglass opacity and therefore he is been here. He denies any chest pain     FeNO 34ppb 08/21/2016 and slightly high  Walking desaturation test on 08/21/2016 185 feet x 3 laps on RA:  did NOT desaturate. Rest pulse ox was 100%, final pulse ox was 90. HR response  50/min at rest to 62/min at peak exertion.    IMPRESSION: CT scan of the chest personally visualized 1. No acute abnormality is seen on CT of the chest. 2. Coronary artery calcifications. 3. Minimal ground-glass opacity in the left upper lobe of doubtful significance. Initial follow-up with CT at 6-12 months is recommended to confirm persistence. If persistent, repeat CT is recommended every 2 years until 5 years of stability has been established. This recommendation follows the consensus statement: Guidelines for Management of Incidental Pulmonary Nodules Detected on CT Images: From the Fleischner Society 2017; Radiology 2017; 284:228-243.     Electronically Signed   By: Deward Dames M.D.   On: 06/11/2016 17:14    09/11/2016 PFT' Follow Up: Brian Hudson is a 73 y.o. male former smoker quit in 1994 with history of Wegner's Disease since 2004, and dyspnea on exertion.  Pt. Was seen by Dr. Geronimo 08/21/2016 for  dyspnea .Plan at that time was as follows: PLAN  - start symbicort  2 puff twice daily - take sample worth 1 month  - do ono test room air next few to several days  - refer cardiology  - Dr Ladona or [facility] first available - sign record releast to get more info on wegner from Dr Jacob esp blood work  - do full PFT  Patient presents today for follow-up. Patient has been compliant with Symbicort  since 08/21/2016. He states he cannot tell any significant  difference. In his dyspnea. He states he continues to get short of breath at times with exercise and also when talking. He states his shortness of breath does not wake him from sleep. He continues to be unable to walk as he has done in the past. He was seen by Leita Lobstein at [facility] heart care on 09/06/2016. Of note there is a significant family history which includes coronary artery disease in his father, who had MI at the age of 84.Risk factors include CAD, DM, HLD, + FH premature CAD, hx tobacco use and coronary calcifications on CT  of chest.The patient is Scheduled for an exercise stress test 6/14 and an event monitor 6/14 to rule out tachy bradycardia syndrome. He states he does have secretions in the morning, yellowish with occasional blood tinged secretions, that self resolves..Lab work has not been sent from Dr. Ishmael In [place], therefore I am unable to review. Patient denies any fever, chest pain, orthopnea, or hemoptysis.   Test Results:  CT angiogram: 06/11/2016 No acute abnormality is seen on CT of the chest. 2. Coronary artery calcifications. 3. Minimal ground-glass opacity in the left upper lobe of doubtful significance. Initial follow-up with CT at 6-12 months is recommended to confirm persistence  Ambulatory saturation: 08/21/2016 185 feet 3 laps on room air. Did not desaturate. Resting pulse ox was 100%. Final pulse ox was 90%. Heart rate response 50/m at rest is 62/m at peak exertion.  EKG 09/06/2016 Sinus bradycardia with poor R-wave progression, heart rate 44   OV 03/24/2017 Chief Complaint  Patient presents with   Follow-up    CT scan 03/14/17.  Pt states that he still has some tightness in chest, has occ coughing, and is SOB all the time.  Pt also states that he has gotten choked several times while trying to eat.   Remote Wegner's granulomatosis in 2004 status post therapy. Remote smoking history  Follow-up shortness of breath: eing seen in pulmonary clinic for shortness of breath. Earlier this year we referred him to cardiology on basis of coronary artery calcification and strong family history. He status post coronary artery stent 2. After that his cardiac rehabilitation. Dyspnea is only some better he still has residual dyspnea  Follow-up left upper lobe lung nodule is a groundglass opacity. He had follow-up CT scan of the chest without contrast 03/14/2017. This is documented below. It appears the ground glass opacity persists but it is also close to 2 cm. However he says that his  Olena is under remission. He did see Dr. Ishmael earlier this year and apparently blood work was normal.  New issue: Is complaining of nonspecific dysphagia 4 times in the last 2 weeks. It is for solids. He is pretty significant. No associated vomiting or aspiration. His sister confirms the same.  IMPRESSION: ct chest 03/14/17 Persistent 12 x 19 mm ground-glass opacity in the central left upper Lobe. Adenocarcinoma cannot be excluded. Follow up by CT is recommended in 12 months, with continued annual surveillance for a minimum of 3 Years. These recommendations are taken from: Recommendations for the Management of Subsolid Pulmonary Nodules Detected at CT: A Statement from the Fleischner Society Radiology 2013; 266:1, [phone].   Aortic Atherosclerosis (ICD10-I70.0).     Electronically Signed   By: Pinkie Pebbles M.D.   On: 03/14/2017 09:16   OV 04/29/2017  Chief Complaint  Patient presents with   Follow-up    PET scan done 04/11/17.  Pt has complaints of coughing (non-productive), shortness of breath  Follow-up shortness of breath in this patient with remote Wagnon history and remote smoking history Follow-up left lower lobe lung nodule  In terms of shortness of breath: He is compliant with his Symbicort  for empiric asthma treatment.  He had coronary artery stents in the middle of 2018.  Despite this he is short of breath.  He says this frustrates him significantly.  He tells me that he is able to walk a block or climb steps without a problem or less of a problem.  And otherwise he does not been otherwise he does have some dyspnea on exertion for these activities.  But the significant amount of dyspnea happens when he is talking a lot.  He feels this is Wegener's dj vu again.  He did have Wegener's serology and did see Dr. Ishmael in rheumatology end of 2018 and there is no evidence of Wegener's recurrence but he still feels this is white not measurable.  He has an appointment  upcoming with Dr. Oneil Parchment his cardiologist.  In terms of left lower lobe lung nodule groundglass opacity: This was noted December 2018 CT chest.  He had a follow-up CT PET in January 2019 that showed this nodule was beginning to shrink.  His Wegner antibodies were negative/trace positive.  He has been reassured.  He requires a follow-up CT scan end of 2019.  New issue: Is complaining of chronic sinus discharge for months.  He feels is all Wegener's again.  I have repeatedly told him that so far we do not have evidence of recurrence.  OV 06/13/2017  Chief Complaint  Patient presents with   Follow-up    lung nodule, no SOB, no Wheezing, No chest tightness    Follow-up dyspnea: In terms of dyspnea he had a cardiopulmonary stress test that suggested coronary issues.  He then had a cardiac cath in February 2019 and the place a second coronary stent.  He tells me now that his dyspnea is better.  In fact when he talks he gets dyspnea and even this is better but he still has residual dyspnea.  He is trying to get into cardiac rehab and is waiting to hear from them.  There is no wheezing or cough  In terms of left lower lobe lung nodule groundglass opacity: He has follow-up scan pending in December 2019.  There are no other new issues.  OV 05/12/2020  Subjective:  Patient ID: Brian Hudson, male , DOB: 23-May-1950 , age 69 y.o. , MRN: [id number] , ADDRESS: [address] [place] [address] PCP Delice Charleston, MD Patient Care Team: Delice Charleston, MD as PCP - General (Internal Medicine) Parchment Oneil BROCKS, MD as PCP - Cardiology (Cardiology)  This Provider for this visit: Treatment Team:  Attending Provider: Geronimo Amel, MD    05/12/2020 -   Chief Complaint  Patient presents with   Follow-up    SOB getting worse.    Follow-up history of Wegener's with left lower lobe nodule -Follow-up remote smoking Follow-up dyspnea on exertion -Has associated coronary artery  disease.   HPI Brian BEVILLE 73 y.o. -almost 3 years since I last saw him.  This is just under 3-year visit.  This is the further routine follow-up visit.  He tells me that in the last 3 years he had insidious onset of worsening shortness of breath.  This is accelerated in the last 6 months.  Talking makes it worse the worst is when he talks.  Although it is exertional when he  change his clothes and he climbs stairs.  He looks like of having a flat affect but it did not discuss any anxiety or depression in him.  He has not seen the cardiologist in a while.  He says he has upcoming appointment although I did not see it.  His cardiologist Dr. Oneil Parchment.  He has a history of associated coronary artery disease and coronary artery stents but he denies any chest pain.  No orthopnea proximal nocturnal dyspnea or wheezing.  He does have a tickle in his throat when he lies down but otherwise no cough.  Last CT scan of the chest 2019 showing slight reduction in groundglass lesion in the left upper lobe.    CT Chest dat - dec 2019   IMPRESSION: 1. Stable to slightly smaller left upper lobe ground-glass lesion, measuring 9 x 18 mm today. Follow up by CT is recommended in 12 months, with continued annual surveillance for a minimum of 3 years. These recommendations are taken from: Recommendations for the Management of Subsolid Pulmonary Nodules Detected at CT: A Statement from the Fleischner Society Radiology 2013; 266:1, [phone]. 2.  Aortic Atherosclerois (ICD10-170.0)     Electronically Signed   By: Camellia Candle M.D.   On: 03/11/2018 17:00     PFT  No flowsheet data found.      OV 06/28/2020  Subjective:  Patient ID: Brian Hudson, male , DOB: January 08, 1951 , age 50 y.o. , MRN: [id number] , ADDRESS: [address] [place] [address] PCP Delice Charleston, MD Patient Care Team: Delice Charleston, MD as PCP - General (Internal Medicine) Parchment Oneil BROCKS, MD as PCP - Cardiology  (Cardiology)  This Provider for this visit: Treatment Team:  Attending Provider: Geronimo Amel, MD    06/28/2020 -   Chief Complaint  Patient presents with   Follow-up    PFT performed today.  States he has been doing okay since last visit and denies any real complaints.   Follow-up history of Wegener's with left lower lobe nodule -Follow-up remote smoking Follow-up dyspnea on exertion -Has associated coronary artery disease.   HPI Brian Hudson 73 y.o. -presents for follow-up of his worsening dyspnea.  This visit is to review test results.  He tells me that overall he is stable still has this dyspnea on exertion although this time he tells me it is present on and off.  Test results show the CT scan just shows mild air trapping his lung nodule is stable in 2-3 years but is worse over the last 4 years.  His pulmonary function test is normal his nitric oxide  test is normal.  His echocardiogram shows grade 2 diastolic dysfunction.  He denies any hypertension obesity.  He has not seen his cardiologist Dr. Oneil Parchment despite assurance at the last visit that he would see him.  He has never done pulmonary rehabilitation.  We discussed several care options and he has agreed to start with pulmonary rehabilitation and reestablish with cardiology.   FeNO   - 18ppb 06/28/2020   CT chest 06/07/20   IMPRESSION: 1. No evidence of interstitial lung disease. 2. Irregular ground-glass nodule in the left upper lobe, stable from 03/11/2018 but possibly slightly increased in prominence from baseline 06/11/2016. Follow-up CT chest without contrast in 2 years is recommended. This recommendation follows the consensus statement: Guidelines for Management of Small Pulmonary Nodules Detected on CT Images: From the Fleischner Society 2017; Radiology 2017; 284:228-243. 3. Mild air trapping is indicative of small  airways disease. 4. Aortic atherosclerosis (ICD10-I70.0). Coronary artery calcification.      Electronically Signed   By: Newell Eke M.D.   On: 06/09/2020 09:25      ECHO march 2022   IMPRESSIONS     1. Left ventricular ejection fraction, by estimation, is 60 to 65%. The  left ventricle has normal function. The left ventricle has no regional  wall motion abnormalities. There is mild left ventricular hypertrophy.  Left ventricular diastolic parameters  are consistent with Grade II diastolic dysfunction (pseudonormalization).   2. Right ventricular systolic function is normal. The right ventricular  size is normal. There is normal pulmonary artery systolic pressure. The  estimated right ventricular systolic pressure is 31.7 mmHg.   3. Left atrial size was mildly dilated.   4. The mitral valve is normal in structure. Trivial mitral valve  regurgitation. No evidence of mitral stenosis.   5. The aortic valve is tricuspid. Aortic valve regurgitation is not  visualized. Mild aortic valve stenosis. Aortic valve area, by VTI measures  1.71 cm. Aortic valve mean gradient measures 10.0 mmHg.   6. The inferior vena cava is normal in size with greater than 50%  respiratory variability, suggesting right atrial pressure of 3 mmHg.    OV 01/18/2021  Subjective:  Patient ID: Brian Hudson, male , DOB: 12-01-1950 , age 3 y.o. , MRN: [id number] , ADDRESS: [address] [place] [address] PCP Delice Charleston, MD Patient Care Team: Delice Charleston, MD as PCP - General (Internal Medicine) Jeffrie Oneil BROCKS, MD as PCP - Cardiology (Cardiology)  This Provider for this visit: Treatment Team:  Attending Provider: Geronimo Amel, MD    01/18/2021 -   Chief Complaint  Patient presents with   Follow-up    Pt states he has been doing okay since last visit. States he still becomes SOB with exertion and also has had an occ cough.   Follow-up history of Wegener's with left lower lobe nodule -Follow-up remote smoking Follow-up dyspnea on exertion -Has associated coronary  artery disease.and gr 2 ddx  HPI Brian Hudson 73 y.o. -presents with her sister for routine follow-up.  He continues to as always complain of worsening shortness of breath at each visit.  Again he is doing the same.  In the spring 2022 I referred him back to Dr. Oneil Jeffrie because there was no ILD on the CT scan.  He has grade 2 diastolic dysfunction.  Dr. Jeffrie is evaluated him.  Since then he seen [facility] physicians.  This month 2022 approximately around January 11, 2021 he had blood work.  Shows hemoglobin of 11 g%.  This is a says it is new onset anemia.  His creatinine was 1.46 mg percent.  This is consistent with the April 2022 baseline but worsened in 2019.  This is what it is Wagner's his back but he denies any hematuria or hemoptysis.  He has not had a urine analysis checked.  He is willing to have that done.  There is no pedal edema.  Nevertheless the sister thinks that he is genuinely worse in the past 1 week.  There is no syncope.  He takes empiric Arnuity [last visit we cut his Symbicort  down to Arnuity].  There is no obvious emphysema on the CT.  I discussed about giving empiric trial with Spiriva  he is willing to do that.  Is just to see if this would help his dyspnea because of the mild air trapping seen on CT chest  in spring 2022.  There is no cough or wheezing or fever or weight loss.    CT Chest data  No results found.     OV 04/26/2021  Subjective:  Patient ID: Brian Hudson, male , DOB: 1950-10-07 , age 73 y.o. , MRN: [id number] , ADDRESS: [address] [place] [address] PCP Delice Charleston, MD Patient Care Team: Delice Charleston, MD as PCP - General (Internal Medicine) Jeffrie Oneil BROCKS, MD as PCP - Cardiology (Cardiology)  This Provider for this visit: Treatment Team:  Attending Provider: Geronimo Amel, MD    04/26/2021 -   Chief Complaint  Patient presents with   Follow-up    Pt states he has been doing okay since last visit and denies any complaints.      HPI Brian Hudson 73 y.o. -returns for follow-up.  After seeing me last he says he is actually doing okay he has stable dyspnea on exertion when he does gardening.  Improves with rest.  He says he is learned to pace himself.  I do not have any recent blood work on him.  I think he has it done at [facility] physicians.  There is nothing in care everywhere.  He takes inhalers and he is stable.  At last visit his ANA and ANCA was positive.  I discussed with nurse practitioner to refer him back to Dr. Jon Jacob his rheumatologist who he saw last for Wegener's some years ago.  He has not followed up with her.  He is willing to be referred to her again.  He is up-to-date with flu shot.  No new issues.   OV 06/13/2023  Subjective:  Patient ID: Brian Hudson, male , DOB: 1951/02/26 , age 43 y.o. , MRN: [id number] , ADDRESS: [address] [place] [address] PCP Charlott Dorn LABOR, MD Patient Care Team: Charlott Dorn LABOR, MD as PCP - General (Internal Medicine) Jeffrie Oneil BROCKS, MD as PCP - Cardiology (Cardiology)  This Provider for this visit: Treatment Team:  Attending Provider: Mannam, Praveen, MD    06/13/2023 -   Chief Complaint  Patient presents with   History of granulomatosis with polyangiitis    HPI Brian Hudson 73 y.o. -returns for follow-up.  Last seen 2 years ago.  No presents with his son who is a physical therapy assistant at [employer] and his sister.  They are worried about his health because in April 2024 he had MI and had stents by Dr. Wendel.  However he states that he is actually doing better.  He always has chronic shortness of breath..  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  He has not had any hemoptysis.  The main concern the family has he is on and off vomiting which they think is because of the Ozempic.  He is seeing GI for this.  In  terms of his nodule: He was supposed to have a scan in 2024 but this has not happened.  I have ordered that  In terms of his Olena: In 2023 his numbers were going up but he is not having any active hemoptysis.  He has an appointment upcoming with Dr. DOROTHA Blanch  In terms of his chronic kidney disease last creatinine was April 2024 and the numbers were going up.  He has upcoming appointment Dr. Blanch in nephrology.  I  offered to check renal function today and ANCA panel and they are appreciative of that..  Also get a QuantiFERON gold.  Also get ANCA profile.    In terms of smoking: Continues to be in remission.  OV 07/09/2023  Subjective:  Patient ID: Brian Hudson, male , DOB: 17-May-1950 , age 55 y.o. , MRN: [id number] , ADDRESS: [address] [place] [address] PCP Charlott Dorn LABOR, MD Patient Care Team: Charlott Dorn LABOR, MD as PCP - General (Internal Medicine) Jeffrie Oneil BROCKS, MD as PCP - Cardiology (Cardiology)  This Provider for this visit: Treatment Team:  Attending Provider: Geronimo Amel, MD 07/09/2023 -   Chief Complaint  Patient presents with   Follow-up    Here to review Chest CT done 06/18/23. Pt c/o increased cough and wheezing over the past 4 days- relates to pollen. Cough is occ prod with green to yellow sputum.      HPI Brian Hudson 73 y.o. -returns for follow-up.  Presents with his son.  This visit he was supposed to also have his pulmonary function test done but that has not been scheduled.  Instead he is here for his blood work and CT scan of the chest.  Blood work shows his ANCA is now negative.  His QuantiFERON gold is negative.  His pulmonary nodule groundglass opacity still stable/slightly enlarged.  Radiology is recommending CT scan in 2 years but he is only 41 and is smoked heavily in the past.  Therefore we took a shared decision making to do a CT scan in 1 year.  His dyspnea overall is better.  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  Therefore we took a shared decision making to postpone his pulmonary function test by 6 months.  So he will see me in 6 months with a PFT.  He will see me again in 1 year after the CT scan.  PFT     Latest Ref Rng & Units 06/28/2020    9:02 AM  PFT Results  FVC-Pre L 4.63   FVC-Predicted Pre % 114   FVC-Post L 4.72   FVC-Predicted Post % 116   Pre FEV1/FVC % % 75   Post FEV1/FCV % % 76   FEV1-Pre L 3.45   FEV1-Predicted Pre % 116   FEV1-Post L 3.57   DLCO uncorrected ml/min/mmHg 23.01   DLCO UNC% % 95   DLCO corrected ml/min/mmHg 23.01   DLCO COR %Predicted % 95   DLVA Predicted % 78   TLC L 7.05   TLC % Predicted % 106   RV % Predicted % 90      Latest Reference Range & Units 06/16/23 08:30  ANCA SCREEN Negative  Negative  QUANTIFERON-TB GOLD PLUS  Rpt  Mitogen-NIL IU/mL 8.02  Rpt: View report in Results Review for more information   LAB RESULTS last 96 hours No results found.  CT chest 06/18/23   Mediastinum/Nodes: High right paratracheal node measuring 8 mm short axis, series 4, image 22, unchanged. No suspicious mediastinal lymphadenopathy. Hilar assessment is limited in the absence of IV contrast. Unremarkable esophagus.   Lungs/Pleura: Irregular ground-glass nodule in the left upper lobe measures 1.8 x 1.2 cm, series 5, image 41. This is unchanged from most recent exam. No solid components. Associated bronchiectasis, unchanged. 3 mm left lower lobe nodule, series 5, image 79, unchanged. 4 mm right middle lobe nodule series 5, image 85, unchanged. Right lower lobe peri  diaphragmatic nodule measuring 4 mm series 5, image 143, unchanged.   No new pulmonary nodule. No acute or focal airspace disease. No pleural fluid. Trachea and central airways are clear. IMPRESSION: 1. Irregular ground-glass nodule in the left upper lobe measuring 1.8 x 1.2 cm, unchanged from most recent exam, but minimally increased from 2018. No solid  components. This exam constitutes 2 years of imaging stability. Continued follow-up recommended, noncontrast CT is recommended in 2 years. 2. Additional small solid bilateral pulmonary nodules are unchanged, and are considered definitively benign given 2 years of imaging stability. 3. Coronary artery calcifications. Aortic Atherosclerosis (ICD10-I70.0).     Electronically Signed   By: Andrea Gasman M.D.   On: 07/06/2023 10:13   OV 01/18/2024  Subjective:  Patient ID: Brian Hudson, male , DOB: [birth year] , age 8 y.o. , MRN: [id number] , ADDRESS: [address] [place] [address] PCP Charlott Dorn LABOR, MD Patient Care Team: Charlott Dorn LABOR, MD as PCP - General (Internal Medicine) Jeffrie Oneil BROCKS, MD as PCP - Cardiology (Cardiology)  This Provider for this visit: Treatment Team:  Attending Provider: Geronimo Amel, MD   Follow-up history of Wegener's with left lower lobe nodule 1.9 cm groundglass opacities last checked 2022  -2023 ANCA panel numbers going up  = last CT March 2025 - rregular ground-glass nodule in the left upper lobe measures 1.8 x 1.2 cm, series 5, image 41  - repeat ct chest recommended in 2022 -Follow-up remote smoking Follow-up dyspnea on exertion -Has associated coronary artery disease.and gr 2 ddx  -MI in April 2024 with stents and also after that echocardiogram grade 1 diastolic dysfunction. -Chronic kidney disease  = creat 1.44mg % in march 2025   01/18/2024 -  No chief complaint on file.    HPI Brian Hudson 73 y.o. -    CT Chest data from date: ****  - personally visualized and independently interpreted : *** - my findings are: ***   PFT     Latest Ref Rng & Units 01/09/2024    8:28 AM 06/28/2020    9:02 AM  PFT Results  FVC-Pre L 4.53  4.63   FVC-Predicted Pre % 114  114   FVC-Post L 4.74  4.72   FVC-Predicted Post % 120  116   Pre FEV1/FVC % % 71  75   Post FEV1/FCV % % 72  76   FEV1-Pre L 3.21   3.45   FEV1-Predicted Pre % 112  116   FEV1-Post L 3.43  3.57   DLCO uncorrected ml/min/mmHg 21.02  23.01   DLCO UNC% % 88  95   DLCO corrected ml/min/mmHg 21.02  23.01   DLCO COR %Predicted % 88  95   DLVA Predicted % 74  78   TLC L 7.27  7.05   TLC % Predicted % 109  106   RV % Predicted % 97  90        LAB RESULTS last 96 hours No results found.       has a past medical history of Arthritis, CAD (coronary artery disease), Concussion, Depression, DM (diabetes mellitus) (HCC), Dyspnea, Dysrhythmia, ED (erectile dysfunction), Fracture (2010), GERD (gastroesophageal reflux disease), HTN (hypertension), Hyperlipidemia, IBS (irritable bowel syndrome), Palpitations, Peripheral vascular disease, Pneumonia, Prostate cancer (HCC) (2007), Sigmoid diverticulosis, and Wegener's granulomatosis.   reports that he quit smoking about 32 years ago. His smoking use included cigarettes. He started smoking about 63 years ago. He has a 77.5 pack-year smoking history.  He has never used smokeless tobacco.  Past Surgical History:  Procedure Laterality Date   CARDIAC CATHETERIZATION     COLONOSCOPY N/A 06/24/2016   Procedure: COLONOSCOPY with propofol ;  Surgeon: Gladis MARLA Louder, MD;  Location: [facility];  Service: Endoscopy;  Laterality: N/A;   colonscopy  2008   CORONARY PRESSURE/FFR STUDY N/A 09/20/2016   Procedure: Intravascular Pressure Wire/FFR Study;  Surgeon: Anner Alm ORN, MD;  Location: [facility] INVASIVE CV LAB;  Service: Cardiovascular;  Laterality: N/A;   CORONARY STENT INTERVENTION N/A 09/20/2016   Procedure: Coronary Stent Intervention;  Surgeon: Anner Alm ORN, MD;  Location: [facility] INVASIVE CV LAB;  Service: Cardiovascular;  Laterality: N/A;   CORONARY STENT INTERVENTION N/A 05/23/2017   Procedure: CORONARY STENT INTERVENTION;  Surgeon: Verlin Lonni BIRCH, MD;  Location: [facility] INVASIVE CV LAB;  Service: Cardiovascular;  Laterality: N/A;   CORONARY/GRAFT ACUTE MI REVASCULARIZATION N/A  07/21/2022   Procedure: Coronary/Graft Acute MI Revascularization;  Surgeon: Wendel Lurena MARLA, MD;  Location: [facility] INVASIVE CV LAB;  Service: Cardiovascular;  Laterality: N/A;   INSERTION PROSTATE RADIATION SEED  2007   LEFT HEART CATH AND CORONARY ANGIOGRAPHY N/A 09/20/2016   Procedure: Left Heart Cath and Coronary Angiography;  Surgeon: Anner Alm ORN, MD;  Location: [facility] INVASIVE CV LAB;  Service: Cardiovascular;  Laterality: N/A;   LEFT HEART CATH AND CORONARY ANGIOGRAPHY N/A 05/23/2017   Procedure: LEFT HEART CATH AND CORONARY ANGIOGRAPHY;  Surgeon: Verlin Lonni BIRCH, MD;  Location: [facility] INVASIVE CV LAB;  Service: Cardiovascular;  Laterality: N/A;   LEFT HEART CATH AND CORONARY ANGIOGRAPHY N/A 07/21/2022   Procedure: LEFT HEART CATH AND CORONARY ANGIOGRAPHY;  Surgeon: Wendel Lurena MARLA, MD;  Location: [facility] INVASIVE CV LAB;  Service: Cardiovascular;  Laterality: N/A;   PARATHYROIDECTOMY Right 02/26/2018   Procedure: RIGHT INFERIOR PARATHYROIDECTOMY;  Surgeon: Eletha Boas, MD;  Location: [facility];  Service: General;  Laterality: Right;   ROTATOR CUFF REPAIR Right     Allergies  Allergen Reactions   Atorvastatin Other (See Comments)    arthralgia Other reaction(s): arthralgias   Codeine Other (See Comments)    jittery   Ezetimibe Other (See Comments)    arthralgia Other reaction(s): ? side effect   Zocor [Simvastatin] Other (See Comments)    arthralgia    Immunization History  Administered Date(s) Administered   Fluzone Influenza virus vaccine,trivalent (IIV3), split virus 01/18/2013   INFLUENZA, HIGH DOSE SEASONAL PF 03/09/2015, 01/06/2017, 01/22/2017, 01/08/2018, 01/20/2019, 03/01/2020, 03/14/2021   Influenza Split 01/24/2009, 01/19/2012   Influenza,inj,Quad PF,6+ Mos 01/18/2011   Influenza-Unspecified 02/07/2023   PFIZER(Purple Top)SARS-COV-2 Vaccination 12/09/2019, 12/31/2019   PNEUMOCOCCAL CONJUGATE-20 05/05/2023   Pneumococcal Conjugate-13 03/09/2015   Pneumococcal  Polysaccharide-23 02/19/2012, 01/08/2018   Td 04/09/1999   Tdap 05/03/2020   Zoster, Live 03/01/2020, 05/03/2020    Family History  Problem Relation Age of Onset   Heart attack Father 96   CAD Father    Diabetic kidney disease Mother    Healthy Sister    Cancer Brother    Healthy Sister      Current Outpatient Medications:    acetaminophen  (TYLENOL ) 325 MG tablet, Take 650 mg by mouth every morning., Disp: , Rfl:    aspirin  81 MG chewable tablet, Chew 1 tablet (81 mg total) by mouth daily., Disp: 90 tablet, Rfl: 2   empagliflozin (JARDIANCE) 25 MG TABS tablet, Take 25 mg by mouth daily., Disp: , Rfl:    escitalopram (LEXAPRO) 10 MG tablet, Take 10 mg by mouth daily., Disp: , Rfl:  Evolocumab  (REPATHA  SURECLICK) 140 MG/ML SOAJ, Inject 140 mg into the skin every 14 (fourteen) days., Disp: , Rfl:    Fluticasone Furoate  (ARNUITY ELLIPTA ) 100 MCG/ACT AEPB, Inhale 2 puffs into the lungs daily., Disp: , Rfl:    glimepiride (AMARYL) 1 MG tablet, Take 1 mg by mouth every morning., Disp: , Rfl:    Multiple Vitamin (MULTIVITAMIN WITH MINERALS) TABS tablet, Take 1 tablet by mouth daily., Disp: , Rfl:    nitroGLYCERIN  (NITROSTAT ) 0.4 MG SL tablet, Place 1 tablet (0.4 mg total) under the tongue every 5 (five) minutes x 3 doses as needed for chest pain., Disp: 25 tablet, Rfl: 4   pantoprazole  (PROTONIX ) 40 MG tablet, Take 1 tablet (40 mg total) by mouth 2 (two) times daily., Disp: 60 tablet, Rfl: 2   predniSONE  (DELTASONE ) 10 MG tablet, Please take prednisone  40 mg x1 day, then 30 mg x1 day, then 20 mg x1 day, then 10 mg x1 day, and then 5 mg x1 day and stop, Disp: 20 tablet, Rfl: 0   rosuvastatin  (CRESTOR ) 40 MG tablet, TAKE 1 TABLET BY MOUTH EVERY DAY, Disp: 90 tablet, Rfl: 3   Semaglutide,0.25 or 0.5MG /DOS, (OZEMPIC, 0.25 OR 0.5 MG/DOSE,) 2 MG/3ML SOPN, 0.25mg  weekly for 4 weeks then 0.5mg  weekly after that, Disp: , Rfl:    ticagrelor  (BRILINTA ) 90 MG TABS tablet, Take 1 tablet (90 mg total) by  mouth 2 (two) times daily., Disp: 180 tablet, Rfl: 3   valsartan (DIOVAN) 80 MG tablet, , Disp: , Rfl:       Objective:   There were no vitals filed for this visit.  Estimated body mass index is 24.91 kg/m as calculated from the following:   Height as of 07/09/23: 5' 8 (1.727 m).   Weight as of 07/09/23: 163 lb 12.8 oz (74.3 kg).  @WEIGHTCHANGE @  There were no vitals filed for this visit.   Physical Exam   General: No distress. *** O2 at rest: *** Cane present: *** Sitting in wheel chair: *** Frail: *** Obese: *** Neuro: Alert and Oriented x 3. GCS 15. Speech normal Psych: Pleasant Resp:  Barrel Chest - ***.  Wheeze - ***, Crackles - ***, No overt respiratory distress CVS: Normal heart sounds. Murmurs - *** Ext: Stigmata of Connective Tissue Disease - *** HEENT: Normal upper airway. PEERL +. No post nasal drip        Assessment/     Assessment & Plan DOE (dyspnea on exertion)    PLAN Patient Instructions  History of Wegener's granulomatosis  CKD  - in 2022 ANCA antibodies were increased and in April 2024 kidney function getting worse - March 2025: ANCA antibody negative and Quantiferon Gold negative - improved kideny function march 2025  Plan - per renal doc  History of anemia 2023  - resolved April 2024 and March 2025  Plan  - check CBC 06/13/2023  Shortness of breath Mild air trapping on  prior CT  - -Shortness of breath mostl likely due to stiff heart muscle aka diastolic dysfunction but currently no ttaking arnuity  - improved after MI April 2024 and now working part time [employer]  - CT scan chest march 2025 without reson for shortness of breath  PLAN - postpone PFT to  6 montns  1.9 cm GGO Nodule of left lung - last seen dec 2019 -> stable/sligh increased  march 2022  and March 2025 Former Smoker  - radiology says to scan > 12 months  Plan  CT chest  wo contrast in 12 months  HAY FEVER - NEW 07/09/2023  Plan  - Please take  prednisone  40 mg x1 day, then 30 mg x1 day, then 20 mg x1 day, then 10 mg x1 day, and then 5 mg x1 day and stop   Follouwp 6 months after PFT   - canclel May 2025 appointments office visit and PFT    FOLLOWUP    No follow-ups on file.    SIGNATURE    Dr. Dorethia Cave, M.D., F.C.C.P,  Pulmonary and Critical Care Medicine Staff Physician, [facility] Center Director - Interstitial Lung Disease  Program  Pulmonary Fibrosis [facility] at [facility] [place], [place], [address]  Pager: [phone], If no answer or between  15:00h - 7:00h: call [phone] Telephone: [phone]  12:04 PM 01/18/2024   Moderate Complexity MDM OFFICE  2021 E/M guidelines, first released in 2021, with minor revisions added in 2023 and 2024 Must meet the requirements for 2 out of 3 dimensions to qualify.    Number and complexity of problems addressed Amount and/or complexity of data reviewed Risk of complications and/or morbidity  One or more chronic illness with mild exacerbation, OR progression, OR  side effects of treatment  Two or more stable chronic illnesses  One undiagnosed new problem with uncertain prognosis  One acute illness with systemic symptoms   One Acute complicated injury Must meet the requirements for 1 of 3 of the categories)  Category 1: Tests and documents, historian  Any combination of 3 of the following:  Assessment requiring an independent historian  Review of prior external note(s) from each unique source  Review of results of each unique test  Ordering of each unique test    Category 2: Interpretation of tests   Independent interpretation of a test performed by another physician/other qualified health care professional (not separately reported)  Category 3: Discuss management/tests  Discussion of management or test interpretation with external physician/other qualified health care  professional/appropriate source (not separately reported) Moderate risk of morbidity from additional diagnostic testing or treatment Examples only:  Prescription drug management  Decision regarding minor surgery with identfied patient or procedure risk factors  Decision regarding elective major surgery without identified patient or procedure risk factors  Diagnosis or treatment significantly limited by social determinants of health             HIGh Complexity  OFFICE   2021 E/M guidelines, first released in 2021, with minor revisions added in 2023. Must meet the requirements for 2 out of 3 dimensions to qualify.    Number and complexity of problems addressed Amount and/or complexity of data reviewed Risk of complications and/or morbidity  Severe exacerbation of chronic illness  Acute or chronic illnesses that may pose a threat to life or bodily function, e.g., multiple trauma, acute MI, pulmonary embolus, severe respiratory distress, progressive rheumatoid arthritis, psychiatric illness with potential threat to self or others, peritonitis, acute renal failure, abrupt change in neurological status Must meet the requirements for 2 of 3 of the categories)  Category 1: Tests and documents, historian  Any combination of 3 of the following:  Assessment requiring an independent historian  Review of prior external note(s) from each unique source  Review of results of each unique test  Ordering of each unique test    Category 2: Interpretation of tests    Independent interpretation of a test performed by another physician/other qualified health care professional (  not separately reported)  Category 3: Discuss management/tests  Discussion of management or test interpretation with external physician/other qualified health care professional/appropriate source (not separately reported)  HIGH risk of morbidity from additional diagnostic testing or treatment Examples  only:  Drug therapy requiring intensive monitoring for toxicity  Decision for elective major surgery with identified pateint or procedure risk factors  Decision regarding hospitalization or escalation of level of care  Decision for DNR or to de-escalate care   Parenteral controlled  substances            LEGEND - Independent interpretation involves the interpretation of a test for which there is a CPT code, and an interpretation or report is customary. When a review and interpretation of a test is performed and documented by the provider, but not separately reported (billed), then this would represent an independent interpretation. This report does not need to conform to the usual standards of a complete report of the test. This does not include interpretation of tests that do not have formal reports such as a complete blood count with differential and blood cultures. Examples would include reviewing a chest radiograph and documenting in the medical record an interpretation, but not separately reporting (billing) the interpretation of the chest radiograph.   An appropriate source includes professionals who are not health care professionals but may be involved in the management of the patient, such as a Clinical research associate, upper officer, case manager or teacher, and does not include discussion with family or informal caregivers.    - SDOH: SDOH are the conditions in the environments where people are born, live, learn, work, play, worship, and age that affect a wide range of health, functioning, and quality-of-life outcomes and risks. (e.g., housing, food insecurity, transportation, etc.). SDOH-related Z codes ranging from Z55-Z65 are the ICD-10-CM diagnosis codes used to document SDOH data Z55 - Problems related to education and literacy Z56 - Problems related to employment and unemployment Z57 - Occupational exposure to risk factors Z58 - Problems related to physical environment Z59 -  Problems related to housing and economic circumstances [phone] - Problems related to social environment [phone] - Problems related to upbringing [phone] - Other problems related to primary support group, including family circumstances Z60 - Problems related to certain psychosocial circumstances Z65 - Problems related to other psychosocial circumstances

## 2024-01-18 NOTE — Patient Instructions (Signed)
    Shortness of breath Mild air trapping on  prior CT   - CT scan March 2025 without reason for shortness of breath  - PFT Oct 2025 is normal - -Shortness of breath mostl likely due to stiff heart muscle aka diastolic dysfunction +/- BRILINTA    PLAN -Brilinta  is helping your heart but might be contributing to your shortness of breath  - talk to your cardiologist Dr Jeffrie about it but do not stop the drug till you talk to him  1.9 cm GGO Nodule of left lung - last seen dec 2019 -> stable/sligh increased  march 2022  and March 2025 Former Smoker    Plan  CT chest wo contrast in 6 months   History of Wegener's granulomatosis  CKD  - in 2022 ANCA antibodies were increased and in April 2024 kidney function getting worse - March 2025: ANCA antibody negative and Quantiferon Gold negative - improved kideny function march 2025  Plan - per renal doc  Flu vaccine Need  Plan  - high dose flu shot 01/19/2024   Follouwp - 6 month wit APP after ct chest

## 2024-01-19 ENCOUNTER — Ambulatory Visit: Admitting: Internal Medicine

## 2024-01-19 ENCOUNTER — Encounter: Payer: Self-pay | Admitting: Internal Medicine

## 2024-01-19 VITALS — BP 116/62 | HR 57 | Temp 98.7°F | Ht 70.0 in | Wt 164.6 lb

## 2024-01-19 DIAGNOSIS — R0602 Shortness of breath: Secondary | ICD-10-CM

## 2024-01-19 DIAGNOSIS — R911 Solitary pulmonary nodule: Secondary | ICD-10-CM

## 2024-01-19 DIAGNOSIS — Z23 Encounter for immunization: Secondary | ICD-10-CM | POA: Diagnosis not present

## 2024-01-19 DIAGNOSIS — R0609 Other forms of dyspnea: Secondary | ICD-10-CM

## 2024-01-28 ENCOUNTER — Telehealth: Payer: Self-pay | Admitting: *Deleted

## 2024-01-28 NOTE — Telephone Encounter (Signed)
 Called patient for his week 72 call for the EVOLVE study left message for him to call back.    [place] Gailen Venne, Research Coordinator

## 2024-02-03 DIAGNOSIS — N1831 Chronic kidney disease, stage 3a: Secondary | ICD-10-CM | POA: Diagnosis not present

## 2024-02-11 DIAGNOSIS — N1831 Chronic kidney disease, stage 3a: Secondary | ICD-10-CM | POA: Diagnosis not present

## 2024-02-11 DIAGNOSIS — I129 Hypertensive chronic kidney disease with stage 1 through stage 4 chronic kidney disease, or unspecified chronic kidney disease: Secondary | ICD-10-CM | POA: Diagnosis not present

## 2024-02-11 DIAGNOSIS — E210 Primary hyperparathyroidism: Secondary | ICD-10-CM | POA: Diagnosis not present

## 2024-02-11 DIAGNOSIS — E1122 Type 2 diabetes mellitus with diabetic chronic kidney disease: Secondary | ICD-10-CM | POA: Diagnosis not present

## 2024-02-13 ENCOUNTER — Ambulatory Visit: Payer: Self-pay | Admitting: Internal Medicine

## 2024-02-13 NOTE — Telephone Encounter (Signed)
 E2C2 Pulmonary Triage - Initial Assessment Questions "Chief Complaint (e.g., cough, sob, wheezing, fever, chills, sweat or additional symptoms) *Go to specific symptom protocol after initial questions. Breathing problem with brief confusion, pt's sister reports  "How long have symptoms been present?" Over week   IOXYGEN: "Do you wear supplemental oxygen?" No If yes, "How many liters are you supposed to use?"     Copied from CRM #[id number]. Topic: Clinical - Red Word Triage >> Feb 13, 2024 10:19 AM Celestine FALCON wrote: Red Word that prompted transfer to Nurse Triage: Pt's sister Barnie Mink on DPR is calling for her brother due to his breathing concerns worsening. Pt has been having SOB to the point where he gasps for air and appears to go in some form of a trance when needing air. Pt's sister Barnie is very concerned and is requesting the pt to be seen this coming week if possible.  Pt sees Dr. Geronimo in [place]. Reason for Disposition . [1] MODERATE longstanding difficulty breathing (e.g., speaks in phrases, SOB even at rest, pulse 100-120) AND [2] SAME as normal  Answer Assessment - Initial Assessment Questions Sister requesting an appointment Monday late, Tues or Wed at any time/ call back   1. RESPIRATORY STATUS: Describe your breathing? (e.g., wheezing, shortness of breath, unable to speak, severe coughing)      It's like he stops breathing when he tries to talk and looks confused, makes growling sound, for few seconds Patient is not currently with him and can't get him on the phone. Last episode, last Monday; but have not seen him; last seen patient yesterday, he reports have not had spell. 2. ONSET: When did this breathing problem begin?      Week and half 3. PATTERN Does the difficult breathing come and go, or has it been constant since it started?      Only happened 2 or 3 times  Sister Reports does not use oxygen. Patient's sister is unable to answer the  remainder of triage questions. Nurse will attempt to call patient.  Protocols used: Breathing Difficulty-A-AH

## 2024-02-13 NOTE — Research (Signed)
 WEEK 72 Follow-Up Visit Completed*   []  Not Necessary, No Potential Adverse Events Or Medication Issues Reported On Completed Subject Questionnaire   []  Yes, Contact With Subject/Alternate Contact Completed   [x]  Yes, No Contact With Subject/Alternate Contact Completed, But Electronic Health Record Was Reviewed   []  No, Unable To Contact Subject/Alternate Contact   Have you reviewed Ongoing medications on the Targeted Concomitant Medication form and updated the form as needed?   [x]  Yes   []  No   Subject Status*   [x]  Continuing In Follow-up   []  At Risk For Lost To Follow-up   []  Withdrawal From All Future Study Activities Including Passive Follow-up By Electronic Health Record Review Or Contact With Healthcare Provider Or Family Member/Friend   []  Death   Vital Status*   [x]  Alive   []  Deceased   []  Unknown   Last Known To Be Alive Source*   []  Subject Completed Follow-up Questionnaire/Seen In Person/Via Telephone Contact   []  Family Member or Caretaker   [x]  Primary Physician Or Medical Records   []  Publicly Available Source   []  Other

## 2024-02-13 NOTE — Telephone Encounter (Signed)
 Called the pt and there was no answer- LMTCB

## 2024-02-13 NOTE — Telephone Encounter (Signed)
 Called patient.  Patient reports he is fine and my breathing is fine, just like that because I use to be a smoker. Sometimes I have to stop talking to catch my breath, I just have to slow down my talking; my sister is overly concern. At my age, it's has to do with my age.  Patient is okay with an appointment next week, no available appts. Requesting work in visit.   Denies difficulty breathing, chest pain, dizziness/faint

## 2024-02-16 NOTE — Telephone Encounter (Signed)
 Called the pt and there was no answer- LMTCB

## 2024-02-17 NOTE — Telephone Encounter (Signed)
 Called and spoke with patient, I asked if he was having any symptoms currently that he needed to be seen for prior to next year.  He verified that he did not need anything currently, he will call us  back if he needs anything.  Nothing further needed.

## 2024-02-18 ENCOUNTER — Encounter: Payer: Self-pay | Admitting: Cardiology

## 2024-02-18 ENCOUNTER — Ambulatory Visit: Attending: Cardiology | Admitting: Cardiology

## 2024-02-18 VITALS — BP 132/64 | HR 54 | Ht 69.0 in | Wt 168.0 lb

## 2024-02-18 DIAGNOSIS — I2510 Atherosclerotic heart disease of native coronary artery without angina pectoris: Secondary | ICD-10-CM

## 2024-02-18 DIAGNOSIS — I10 Essential (primary) hypertension: Secondary | ICD-10-CM | POA: Diagnosis not present

## 2024-02-18 DIAGNOSIS — R0609 Other forms of dyspnea: Secondary | ICD-10-CM | POA: Diagnosis not present

## 2024-02-18 MED ORDER — CLOPIDOGREL BISULFATE 75 MG PO TABS: 75.0000 mg | ORAL_TABLET | Freq: Every day | ORAL | 3 refills | Status: AC

## 2024-02-18 NOTE — Progress Notes (Signed)
 Cardiology Office Note:  .   Date:  02/18/2024  ID:  Brian Hudson, DOB [birth year], MRN [id number] PCP: Charlott Dorn LABOR, MD  [facility] Providers Cardiologist:  Oneil Parchment, MD     History of Present Illness: .   Brian Hudson is a 73 y.o. male Discussed the use of AI scribe  History of Present Illness Brian Hudson is a 73 year old male with coronary artery disease who presents with shortness of breath.  He experiences shortness of breath, particularly during exertion and when talking, stating, 'I got to take a breath to get, so I can say something.' He does not experience shortness of breath while walking around. He has been using Symbicort , two puffs twice daily, but has not noticed any improvement in his symptoms over the years.  He has a history of coronary artery disease, with a recent cardiac catheterization on July 21, 2022, revealing a high-grade proximal right coronary artery lesion treated with a drug-eluting stent. He also has patent left circumflex and obtuse marginal stents. His left ventricular end-diastolic pressure (LVEDP) was 26, and he received 20 mg of IV Lasix  during the procedure. An echocardiogram on the same date showed an ejection fraction (EF) of 55-60% with impaired relaxation.  His current medications include aspirin , Jardiance 25 mg, and possibly Brilinta , although there is some uncertainty about his adherence to Brilinta . He is not taking Valsartan 80 mg or Repatha . He is also on insulin , 12 units daily.  He quit smoking in 1994 and lives alone in [place]. There is a premature family history of coronary disease. His recent lab results show a creatinine level of 1.4 and an LDL of 57.  A family member reports observing episodes where he becomes agitated, stares, and makes a growling sound.      Studies Reviewed: SABRA   EKG Interpretation Date/Time:  Wednesday February 18 2024 16:17:19 EST Ventricular Rate:  54 PR Interval:  144 QRS  Duration:  84 QT Interval:  466 QTC Calculation: 441 R Axis:   30  Text Interpretation: Sinus bradycardia When compared with ECG of 22-Jul-2022 06:37, Criteria for Inferior infarct are no longer Present T wave inversion no longer evident in Inferior leads QT has shortened Confirmed by Parchment Oneil ([id number]) on 02/18/2024 4:30:24 PM    Results LABS Creatinine: 1.4 LDL: 57  DIAGNOSTIC Cardiac catheterization: High grade proximal right coronary artery lesion treated with one drug-eluting stent, patent left circumflex and obtuse marginal stents, LVEDP 26 mmHg (07/21/2022) Echocardiogram: EF 55-60%, impaired relaxation (07/21/2022) EKG: Normal Risk Assessment/Calculations:            Physical Exam:   VS:  BP 132/64   Pulse (!) 54   Ht 5' 9 (1.753 m)   Wt 168 lb (76.2 kg)   BMI 24.81 kg/m    Wt Readings from Last 3 Encounters:  02/18/24 168 lb (76.2 kg)  01/19/24 164 lb 9.6 oz (74.7 kg)  07/09/23 163 lb 12.8 oz (74.3 kg)    GEN: Well nourished, well developed in no acute distress NECK: No JVD; No carotid bruits CARDIAC: RRR, no murmurs, no rubs, no gallops RESPIRATORY:  Clear to auscultation without rales, wheezing or rhonchi  ABDOMEN: Soft, non-tender, non-distended EXTREMITIES:  No edema; No deformity   ASSESSMENT AND PLAN: .    Assessment and Plan Assessment & Plan Shortness of breath (dyspnea) Dyspnea primarily during talking, possibly related to Brilinta  use. No significant dyspnea during walking. Recent pulmonary function test showed good  results. Brilinta  may contribute to dyspnea. - Discontinued Brilinta . - Initiated Plavix  75 mg daily. - Will follow up in six months to assess improvement in dyspnea. - Minimal obstructive airways disease and minimal diffusion defect noted on PFTs recently.  Pulmonary note reviewed.  Coronary artery disease, post-inferior STEMI, post-stenting Recent cardiac catheterization showed high-grade proximal right coronary artery lesion  treated with a drug-eluting stent. Patent left circumflex and obtuse marginal stents. Echocardiogram showed EF of 55-60% with impaired relaxation. No signs of fluid overload. Brilinta  may contribute to dyspnea. - Switched from Brilinta  to Plavix  75 mg daily.  Type 2 diabetes mellitus, on insulin  and Jardiance Currently on insulin  12 units daily and Jardiance 25 mg. No specific issues discussed regarding diabetes management. - Continue current diabetes management with insulin  and Jardiance.         Dispo: 6 mths APP   Signed, Oneil Parchment, MD

## 2024-02-18 NOTE — Patient Instructions (Signed)
 Medication Instructions:  Please discontinue your Brilinta  and Asprin. Start Plavix  75 mg once a day. Continue all other medications as listed.  *If you need a refill on your cardiac medications before your next appointment, please call your pharmacy*  Follow-Up: At [facility], you and your health needs are our priority.  As part of our continuing mission to provide you with exceptional heart care, our providers are all part of one team.  This team includes your primary Cardiologist (physician) and Advanced Practice Providers or APPs (Physician Assistants and Nurse Practitioners) who all work together to provide you with the care you need, when you need it.  Your next appointment:   6 month(s)  Provider:   One of our Advanced Practice Providers (APPs): Morse Clause, PA-C  Lamarr Satterfield, NP Miriam Shams, NP  Olivia Pavy, PA-C Josefa Beauvais, NP  Leontine Salen, PA-C Orren Fabry, PA-C  Hao Meng, PA-C Ernest Dick, NP  Damien Braver, NP Jon Hails, PA-C  Waddell Donath, PA-C    Dayna Dunn, PA-C  Scott Weaver, PA-C Lum Louis, NP Katlyn West, NP Callie Goodrich, PA-C  Xika Zhao, NP Sheng Haley, PA-C    Kathleen Johnson, PA-C       We recommend signing up for the patient portal called MyChart.  Sign up information is provided on this After Visit Summary.  MyChart is used to connect with patients for Virtual Visits (Telemedicine).  Patients are able to view lab/test results, encounter notes, upcoming appointments, etc.  Non-urgent messages can be sent to your provider as well.   To learn more about what you can do with MyChart, go to [email or web address].

## 2024-02-26 DIAGNOSIS — K080 Exfoliation of teeth due to systemic causes: Secondary | ICD-10-CM | POA: Diagnosis not present

## 2024-03-15 ENCOUNTER — Encounter: Payer: Self-pay | Admitting: *Deleted

## 2024-03-15 DIAGNOSIS — Z006 Encounter for examination for normal comparison and control in clinical research program: Secondary | ICD-10-CM

## 2024-03-15 NOTE — Research (Signed)
 WEEK 84 Follow-Up Visit Completed*   []  Not Necessary, No Potential Adverse Events Or Medication Issues Reported On Completed Subject Questionnaire   []  Yes, Contact With Subject/Alternate Contact Completed   [x]  Yes, No Contact With Subject/Alternate Contact Completed, But Electronic Health Record Was Reviewed   []  No, Unable To Contact Subject/Alternate Contact   Have you reviewed Ongoing medications on the Targeted Concomitant Medication form and updated the form as needed?   [x]  Yes   []  No   Subject Status*   [x]  Continuing In Follow-up   []  At Risk For Lost To Follow-up   []  Withdrawal From All Future Study Activities Including Passive Follow-up By Electronic Health Record Review Or Contact With Healthcare Provider Or Family Member/Friend   []  Death   Vital Status*   [x]  Alive   []  Deceased   []  Unknown   Last Known To Be Alive Source*   []  Subject Completed Follow-up Questionnaire/Seen In Person/Via Telephone Contact   []  Family Member or Caretaker   [x]  Primary Physician Or Medical Records   []  Publicly Available Source   []  Other

## 2024-04-09 ENCOUNTER — Other Ambulatory Visit: Payer: Self-pay | Admitting: Cardiology

## 2024-04-21 ENCOUNTER — Other Ambulatory Visit: Payer: Self-pay | Admitting: Cardiology
